# Patient Record
Sex: Female | Born: 1985 | Race: White | Hispanic: No | Marital: Married | Smoking: Never smoker
Health system: Southern US, Community
[De-identification: ages and names within clinical notes are randomized; demographics above are authoritative.]

## PROBLEM LIST (undated history)

## (undated) DIAGNOSIS — I10 Essential (primary) hypertension: Secondary | ICD-10-CM

## (undated) DIAGNOSIS — J45909 Unspecified asthma, uncomplicated: Secondary | ICD-10-CM

## (undated) DIAGNOSIS — G8929 Other chronic pain: Secondary | ICD-10-CM

## (undated) DIAGNOSIS — G43909 Migraine, unspecified, not intractable, without status migrainosus: Secondary | ICD-10-CM

## (undated) DIAGNOSIS — E119 Type 2 diabetes mellitus without complications: Secondary | ICD-10-CM

## (undated) DIAGNOSIS — K430 Incisional hernia with obstruction, without gangrene: Secondary | ICD-10-CM

## (undated) DIAGNOSIS — G4730 Sleep apnea, unspecified: Secondary | ICD-10-CM

## (undated) DIAGNOSIS — M25559 Pain in unspecified hip: Secondary | ICD-10-CM

## (undated) HISTORY — DX: Other chronic pain: G89.29

## (undated) HISTORY — DX: Migraine, unspecified, not intractable, without status migrainosus: G43.909

## (undated) HISTORY — DX: Type 2 diabetes mellitus without complications: E11.9

## (undated) HISTORY — DX: Pain in unspecified hip: M25.559

## (undated) HISTORY — DX: Unspecified asthma, uncomplicated: J45.909

## (undated) HISTORY — DX: Morbid (severe) obesity due to excess calories: E66.01

## (undated) HISTORY — PX: COLON SURGERY: SHX602

## (undated) HISTORY — PX: STOMACH SURGERY: SHX791

## (undated) HISTORY — PX: OTHER SURGICAL HISTORY: SHX169

## (undated) HISTORY — DX: Sleep apnea, unspecified: G47.30

## (undated) HISTORY — DX: Essential (primary) hypertension: I10

## (undated) HISTORY — DX: Incisional hernia with obstruction, without gangrene: K43.0

## (undated) HISTORY — PX: CHOLECYSTECTOMY: SHX55

---

## 2008-12-07 ENCOUNTER — Ambulatory Visit: Payer: Self-pay | Admitting: Unknown Physician Specialty

## 2008-12-07 IMAGING — US ABDOMEN ULTRASOUND
1 series · 14 of 25 positions shown · non-contrast
Comparison: none

REASON FOR EXAM: epigastric pain   nausea vomiting
COMMENTS:

[Series 1: abdomen ultrasound · 0.50mm/px · 14 of 45 slices shown]
[im 1/45]
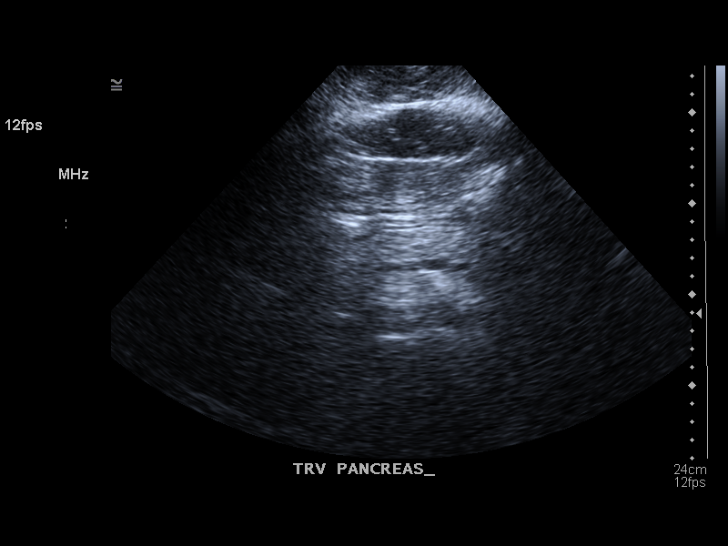
[im 4/45]
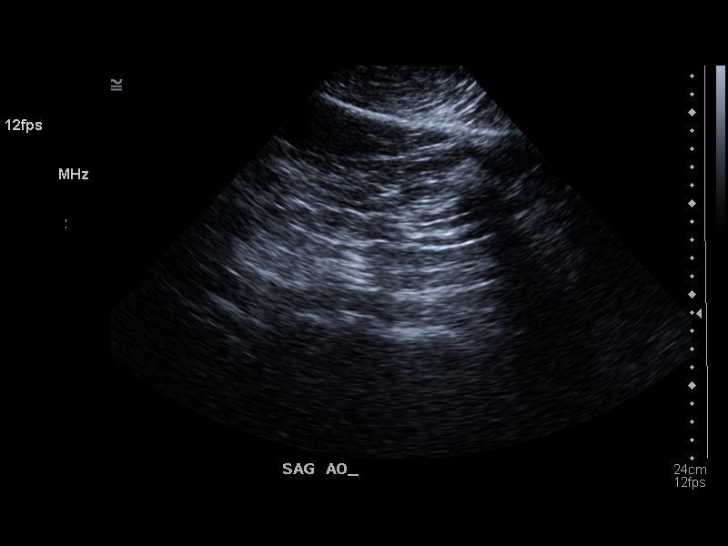
[im 8/45]
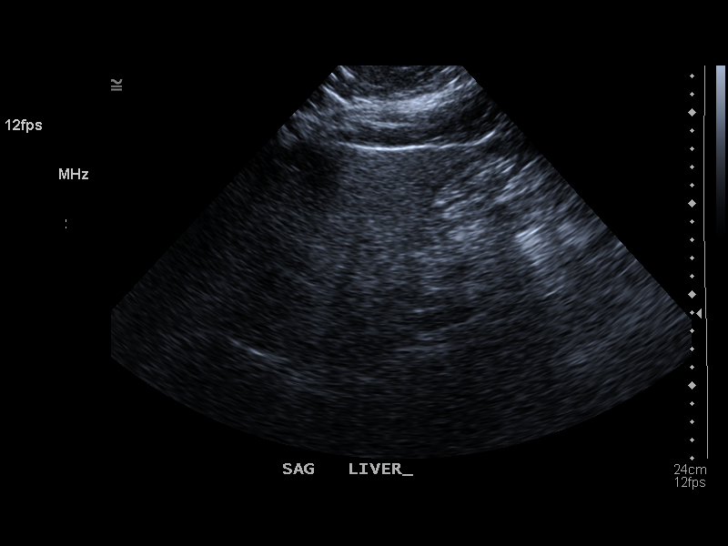
[im 12/45]
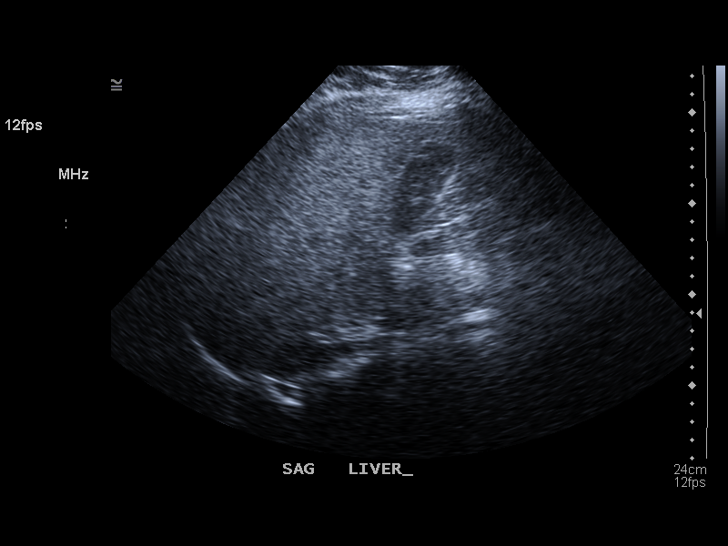
[im 15/45]
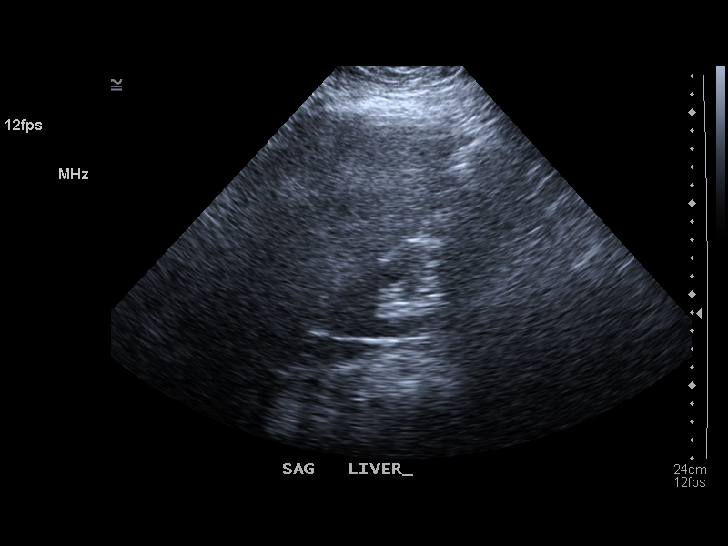
[im 17/45]
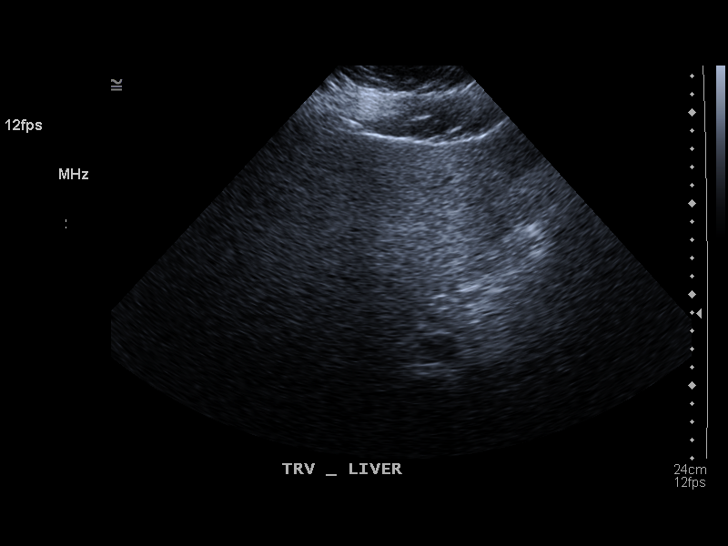
[im 21/45]
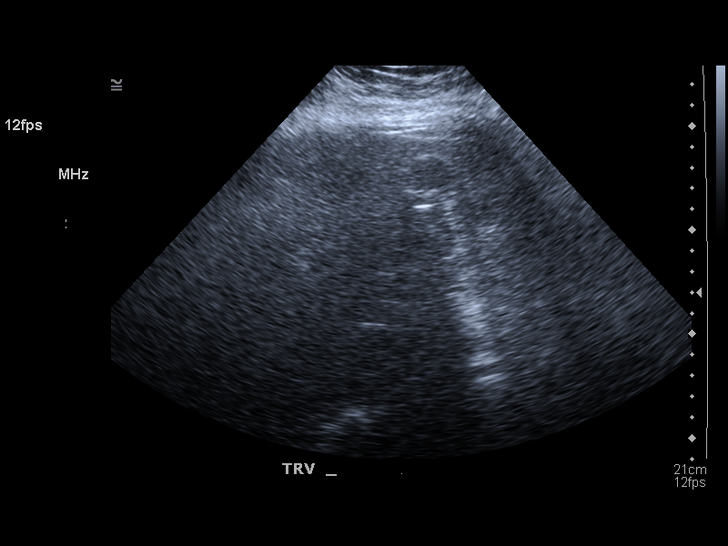
[im 24/45]
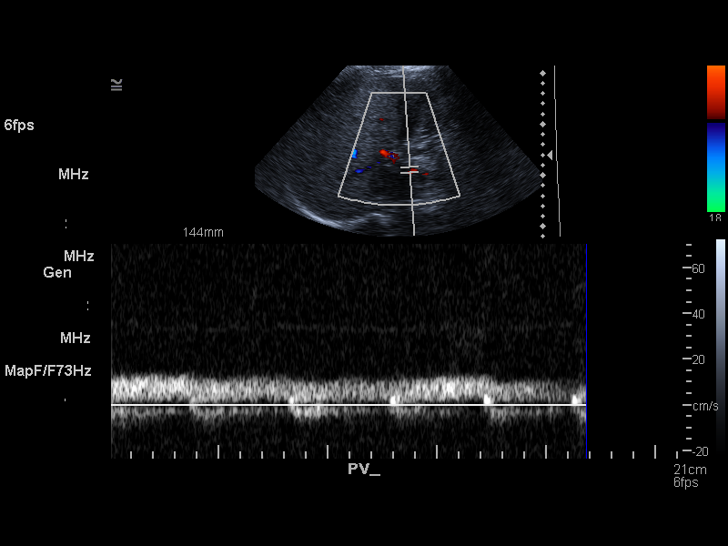
[im 28/45]
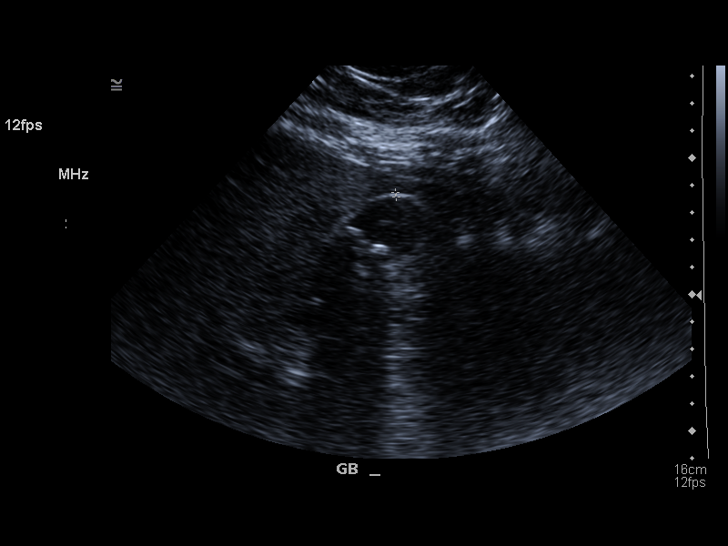
[im 30/45]
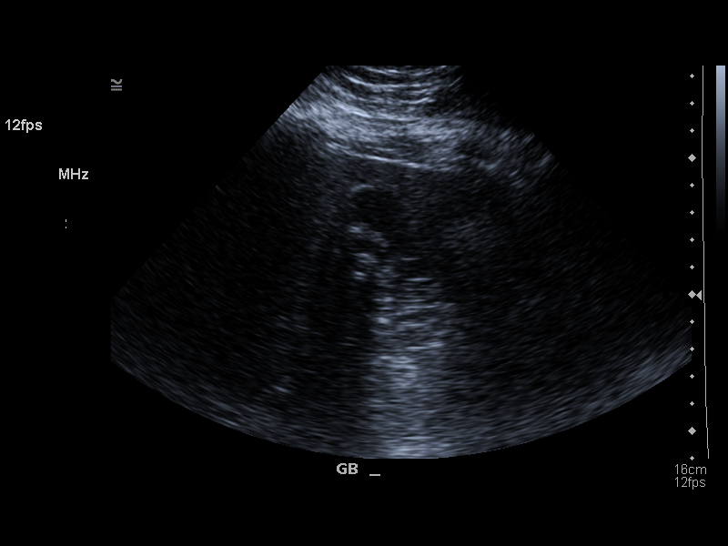
[im 34/45]
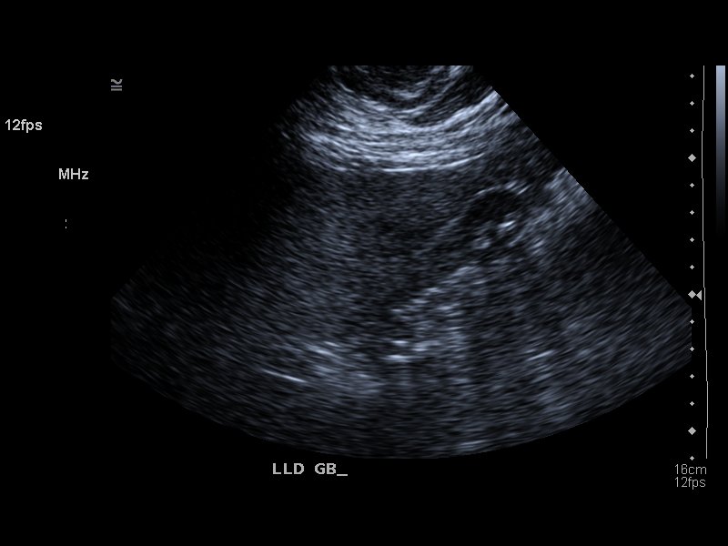
[im 37/45]
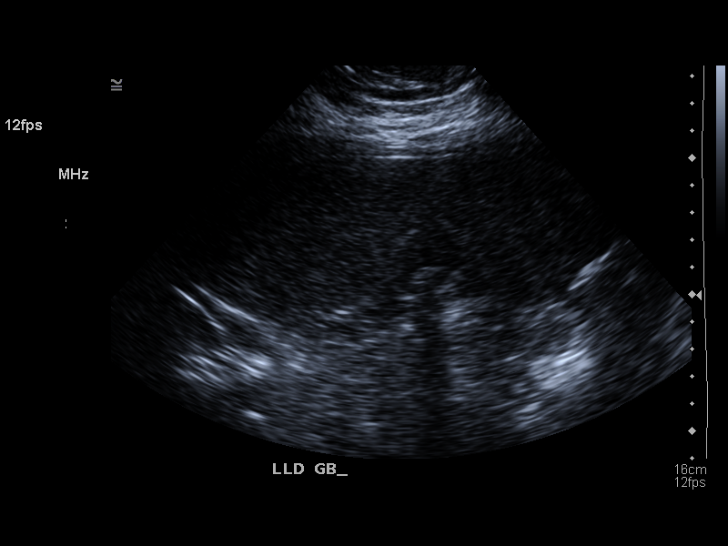
[im 41/45]
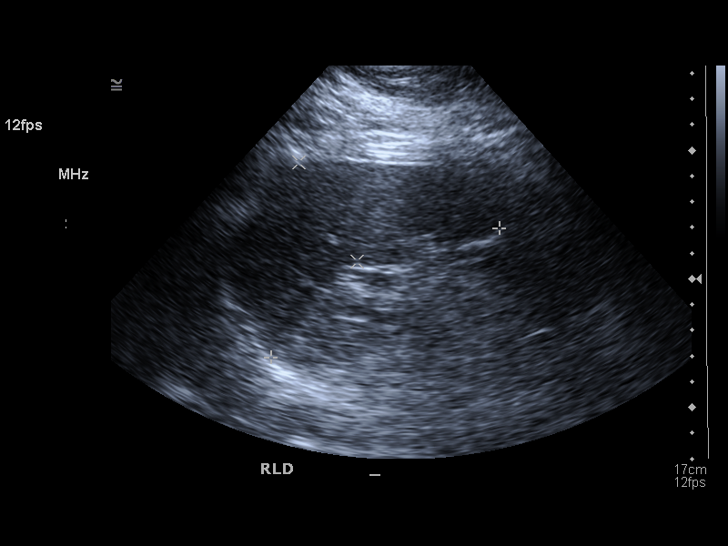
[im 45/45]
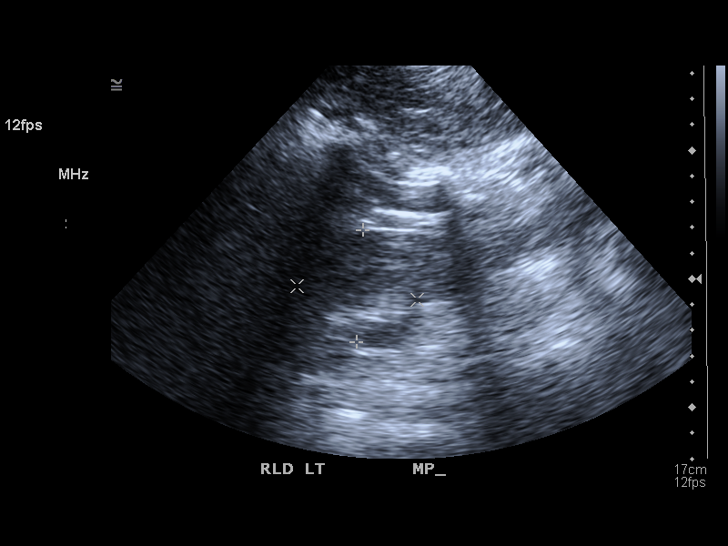

[14 of 25 positions shown; findings below may reference images not displayed]

PROCEDURE:     US  - US ABDOMEN GENERAL SURVEY  - [DATE]  [DATE]

RESULT:     Ultrasound of the abdomen is performed in the standard fashion.

The pancreas cannot be well demonstrated. The study is limited because of
the patient's body habitus. The liver echotexture is grossly normal with no
mass. Portal venous flow is normal. There is slightly increased echogenicity
of the liver suggestive of fatty infiltration. Cholelithiasis is present.
The gallbladder wall thickness is 1.7 mm. The common bile duct diameter is
4.8 mm. The stones appear to be mobile. There is no focal sonographic
Murphy's sign. The spleen and kidneys appear unremarkable. The aorta is not
visualized.
IMPRESSION: Cholelithiasis with multiple mobile shadowing stones. No
definite ultrasound evidence of acute cholecystitis is demonstrated. There
does appear to be fatty infiltration of the liver. The aorta and pancreas
are not well demonstrated.

## 2008-12-13 ENCOUNTER — Ambulatory Visit: Payer: Self-pay | Admitting: Surgery

## 2008-12-13 IMAGING — CR DG CHOLANGIOGRAM OPERATIVE
1 series · 1 of 1 positions shown · non-contrast
Comparison: No comparison

REASON FOR EXAM: cholelithisis
COMMENTS:

PROCEDURE:     DXR - DXR CHOLANGIOGRAM OP (INITIAL)  - [DATE] [DATE]
RESULT:     History: Cholelithiasis

[imported/digitized images]
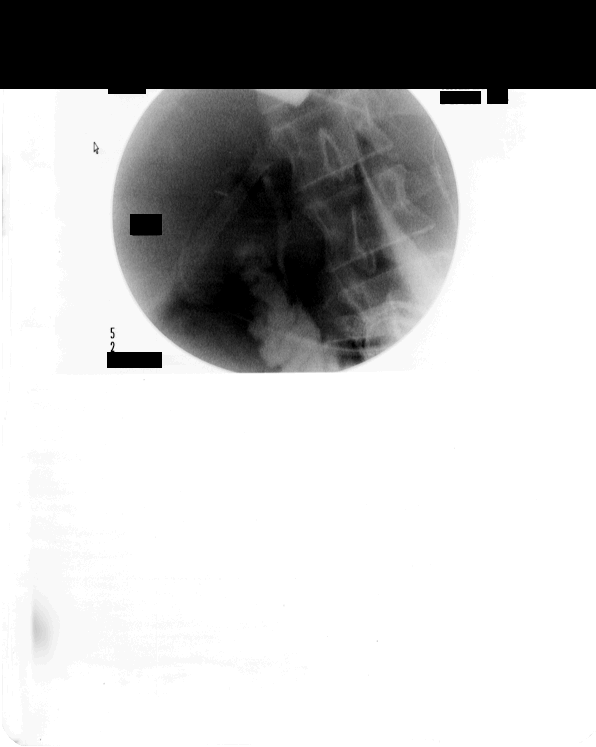

[1 of 1 positions shown; findings below may reference images not displayed]

FINDINGS: Three fluoroscopic intraoperative spot images from intraoperative
cholangiogram demonstrates opacification of the common bile duct without
evidence of a focal filling defect. There is reflux of contrast into the
proximal pancreatic duct. Contrast is seen in the common hepatic duct.
IMPRESSION: Please see above.

## 2009-11-26 ENCOUNTER — Emergency Department: Payer: Self-pay | Admitting: Internal Medicine

## 2010-05-02 ENCOUNTER — Ambulatory Visit: Payer: Self-pay | Admitting: Unknown Physician Specialty

## 2010-05-07 ENCOUNTER — Ambulatory Visit: Payer: Self-pay | Admitting: General Practice

## 2010-05-07 IMAGING — CR DG CHEST 2V
1 series · 3 of 3 positions shown · non-contrast
Comparison: none

REASON FOR EXAM: shortness of breath cough  x 3 weeks fax report to [REDACTED] [PHONE_NUMBER]
COMMENTS:

PROCEDURE:     DXR - DXR CHEST PA (OR AP) AND LATERAL  - [DATE] [DATE]
RESULT:     The lung fields are clear. The heart, mediastinal and osseous
structures show no significant abnormalities. No pneumothorax or pleural
effusion is seen.

[Series 1: view not recorded · 0.17mm/px · 3 of 3 slices shown]
[im 1/3]
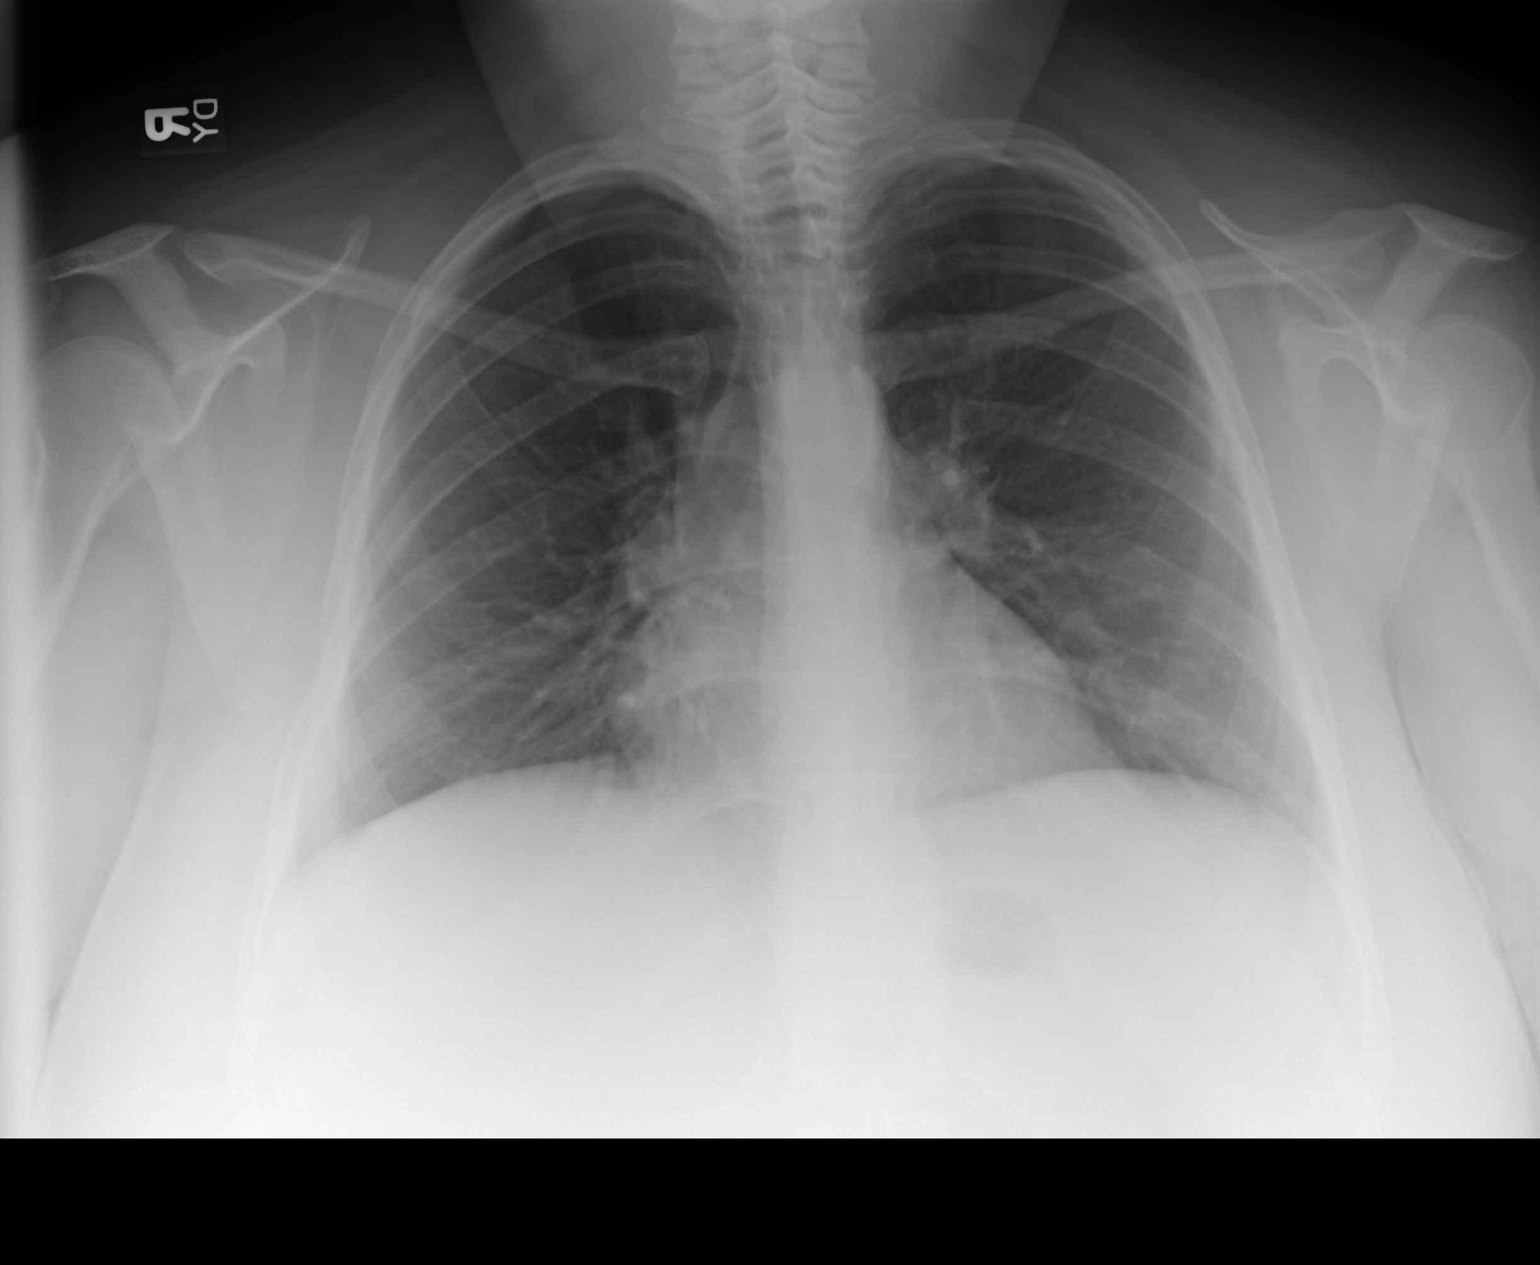
[im 2/3]
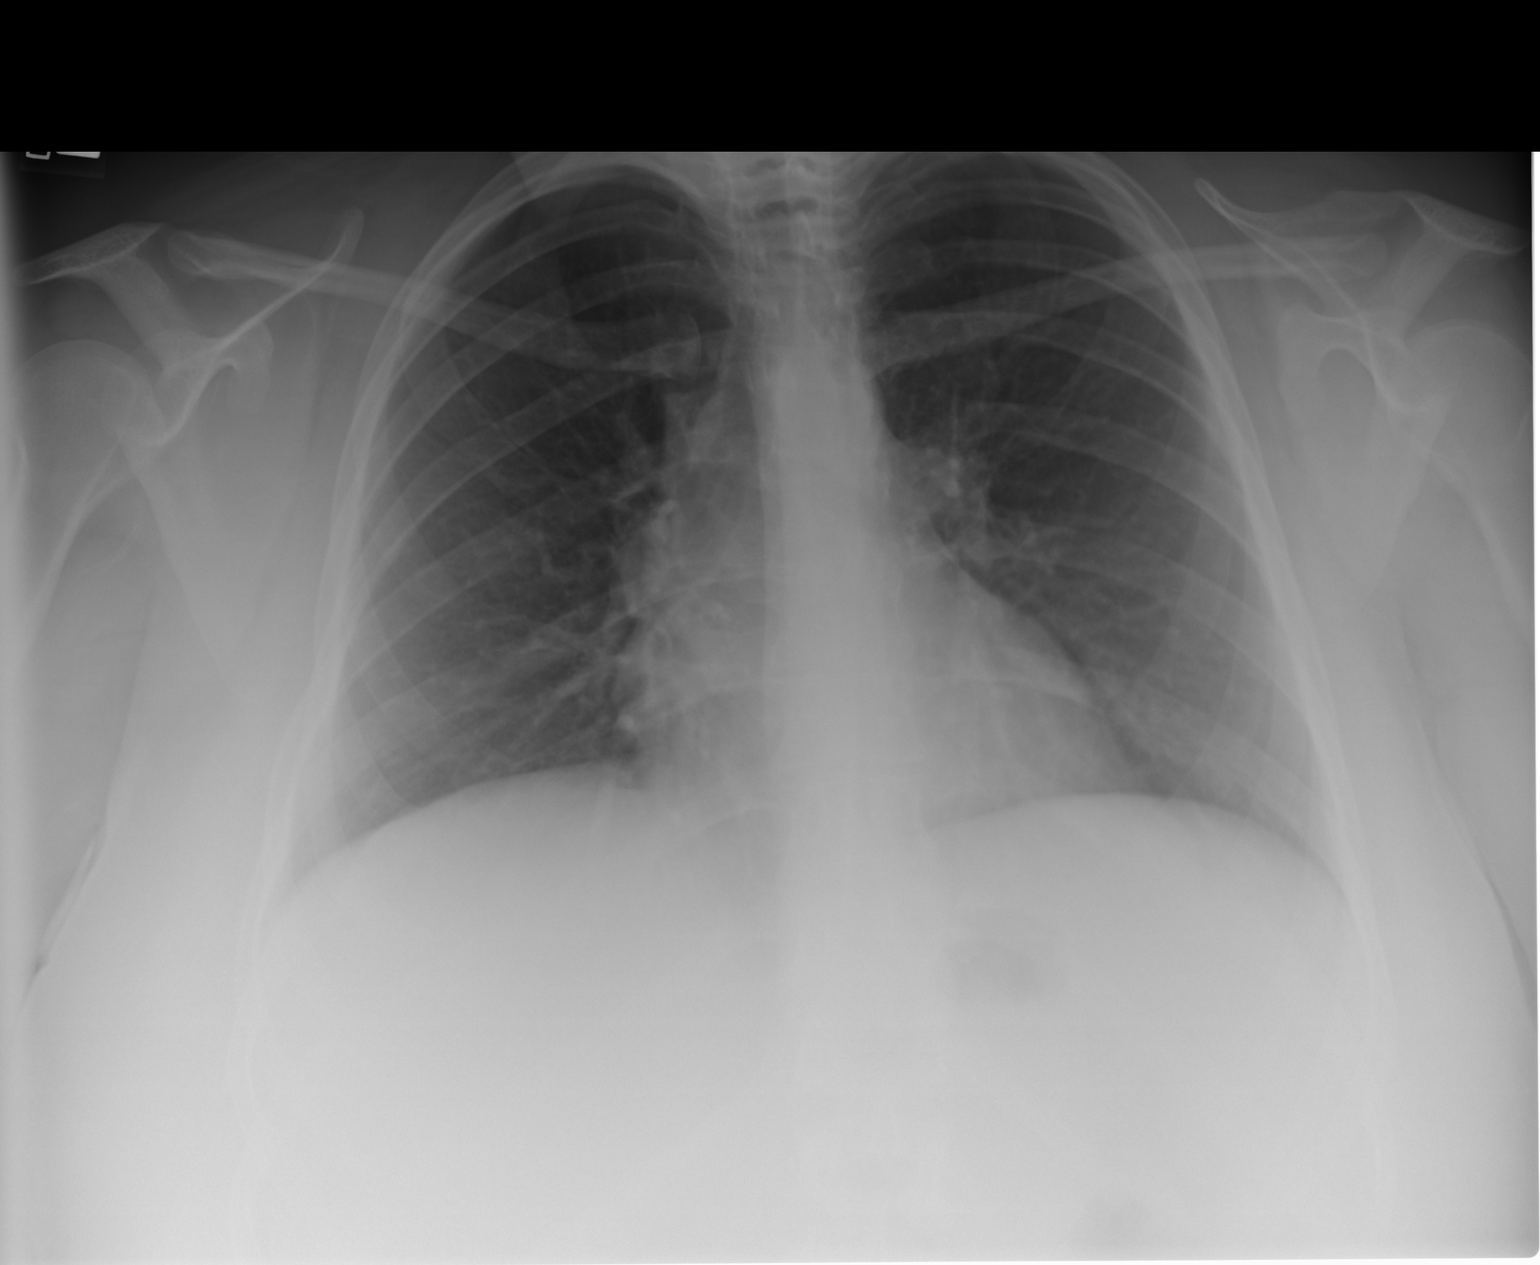
[im 3/3]
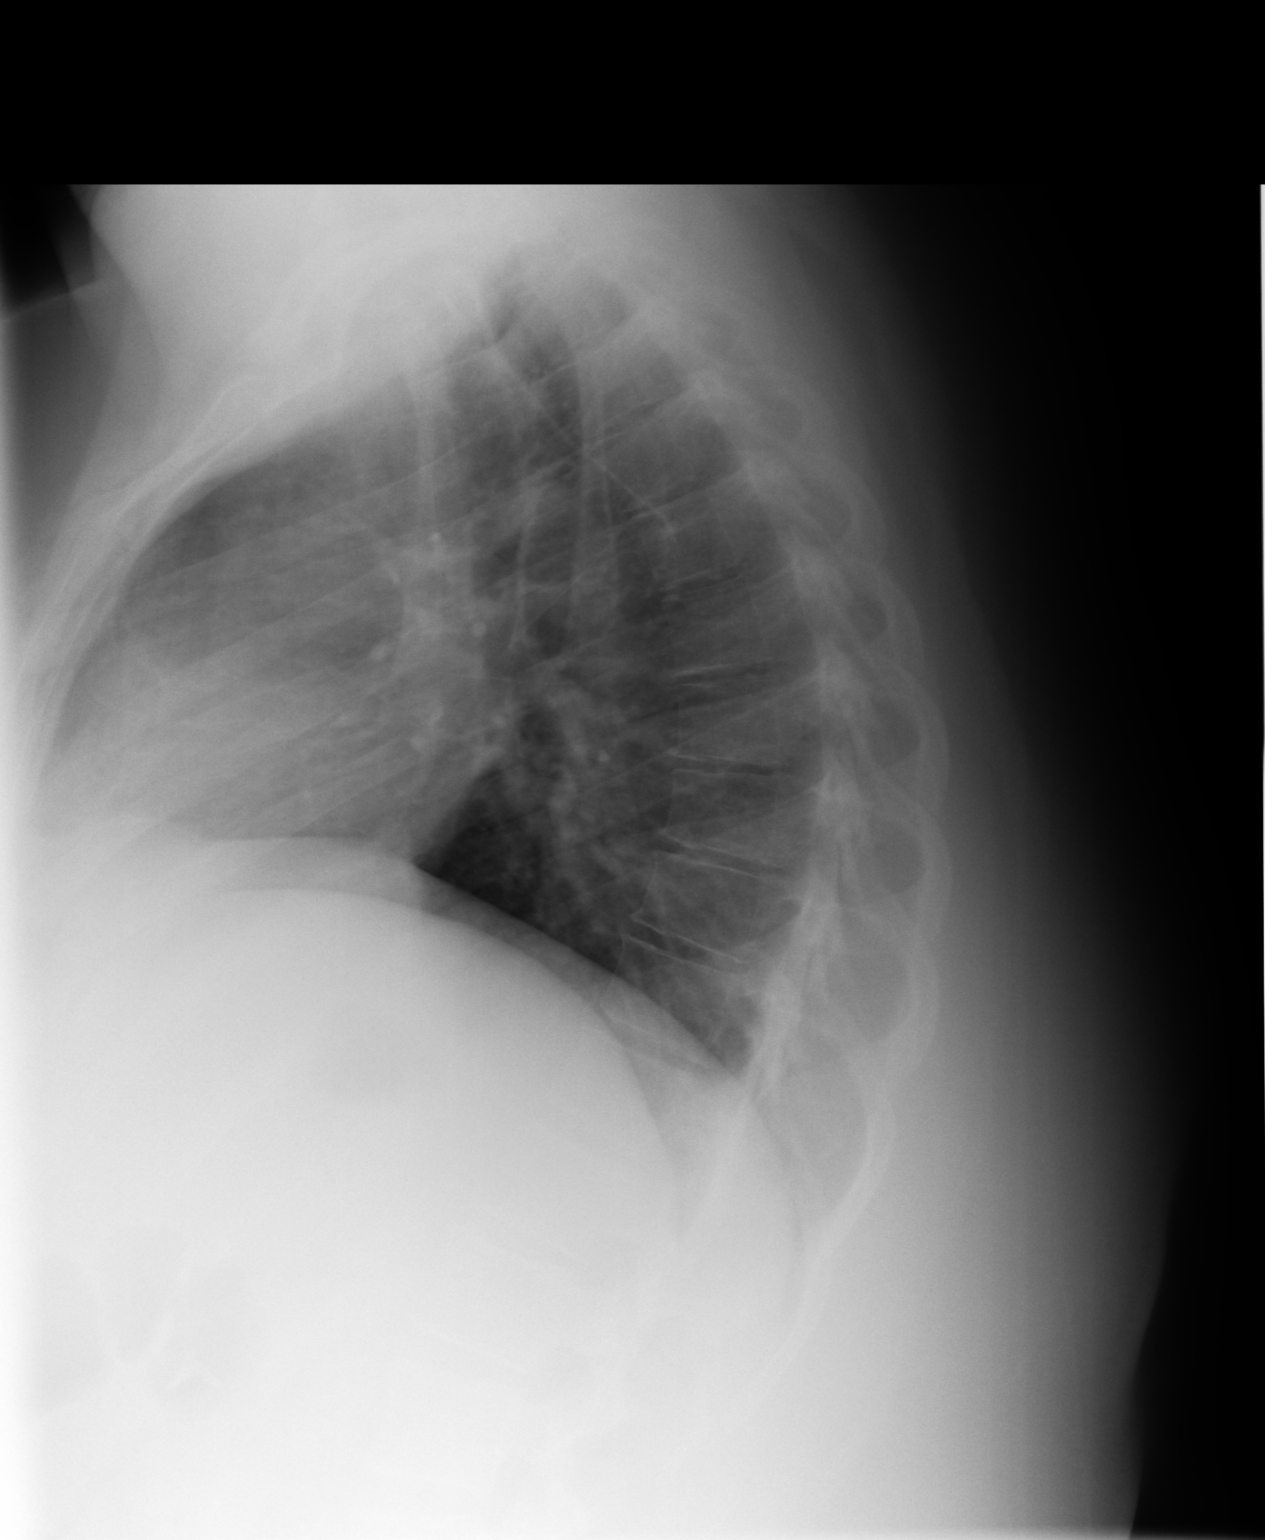

[3 of 3 positions shown; findings below may reference images not displayed]

IMPRESSION: 1. No significant abnormalities are noted.

## 2010-05-08 ENCOUNTER — Emergency Department: Payer: Self-pay | Admitting: Emergency Medicine

## 2010-05-08 ENCOUNTER — Ambulatory Visit: Payer: Self-pay | Admitting: Family Medicine

## 2010-05-24 ENCOUNTER — Ambulatory Visit: Payer: Self-pay | Admitting: Unknown Physician Specialty

## 2010-09-17 ENCOUNTER — Encounter: Payer: Self-pay | Admitting: Maternal and Fetal Medicine

## 2010-09-17 IMAGING — US US OB DETAIL+14 WK - NRPT [facility]
1 series · 14 of 28 positions shown · non-contrast
Comparison: none

[Series 1: us ob detail+14 wk - nrpt [facility] · 14 of 70 slices shown]
[im 3/70]
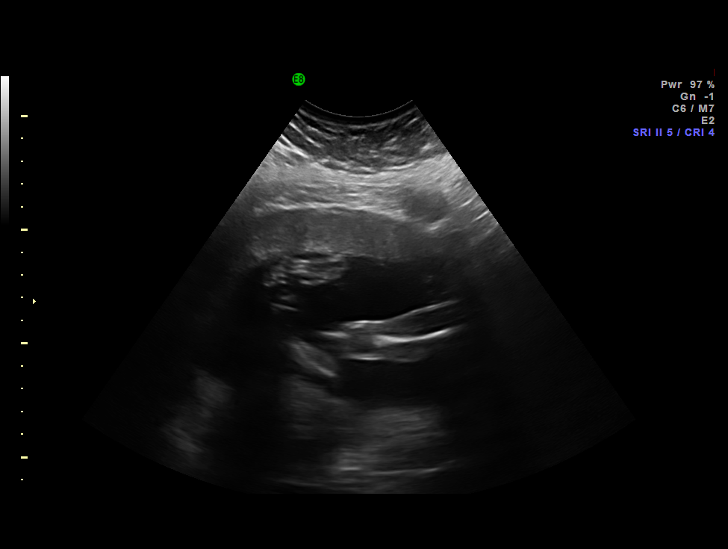
[im 8/70]
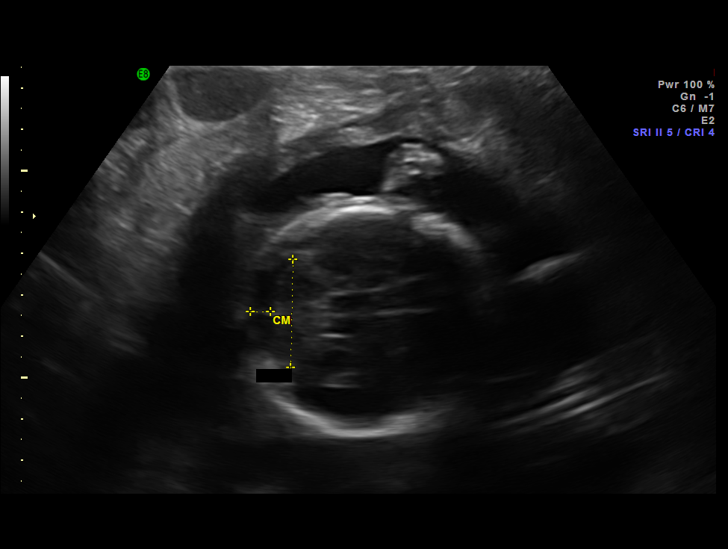
[im 13/70]
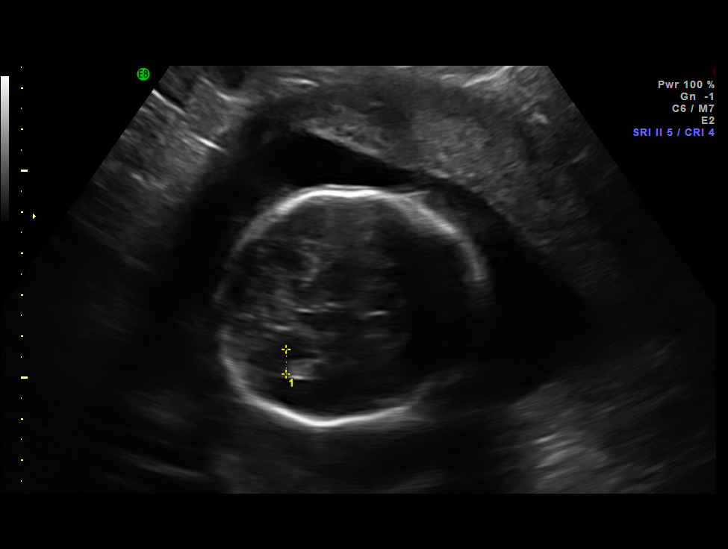
[im 18/70]
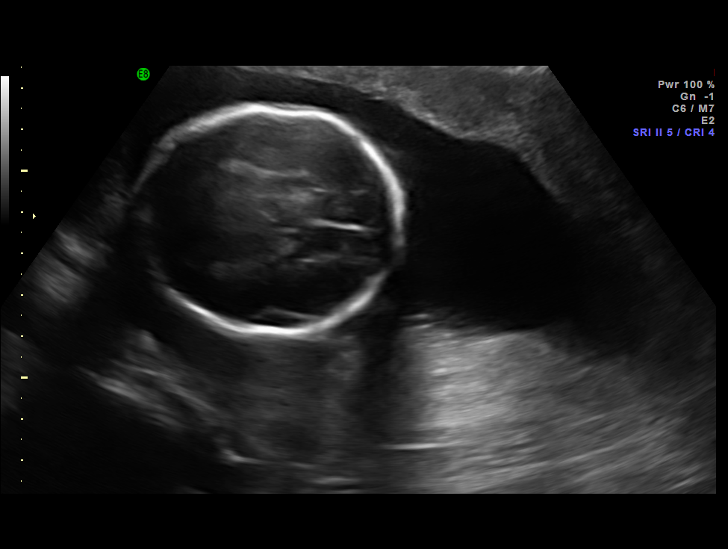
[im 24/70]
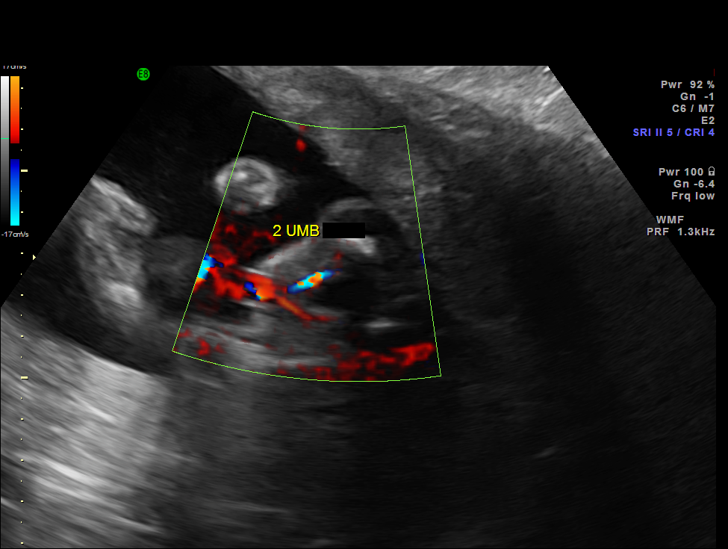
[im 29/70]
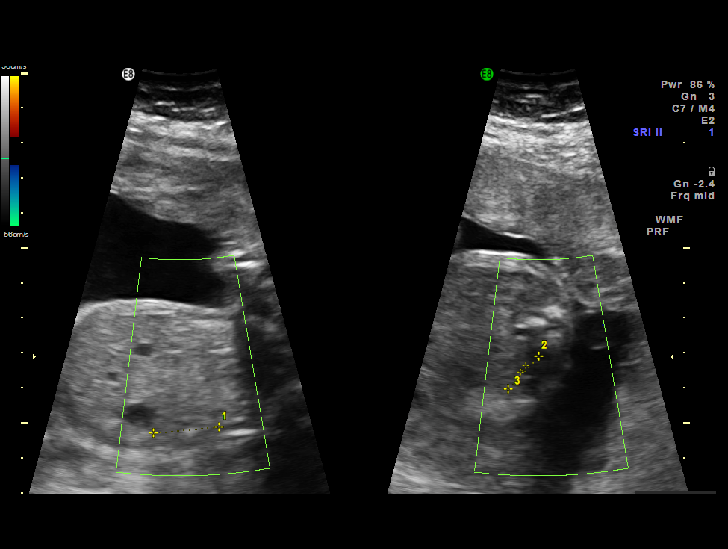
[im 34/70]
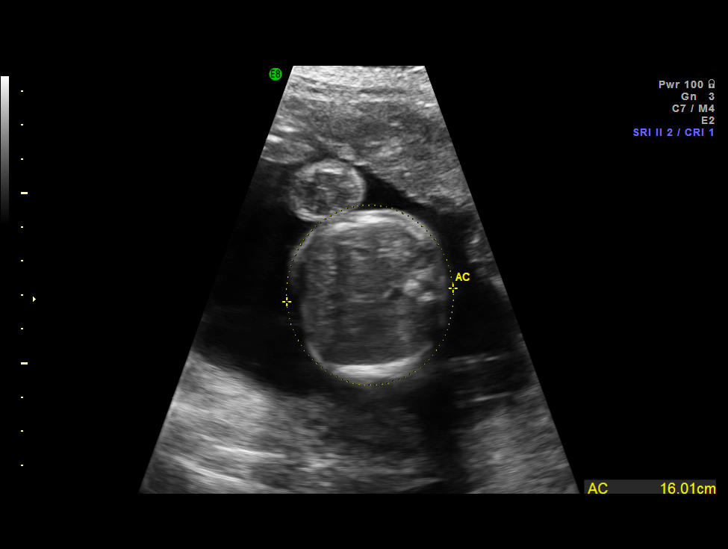
[im 39/70]
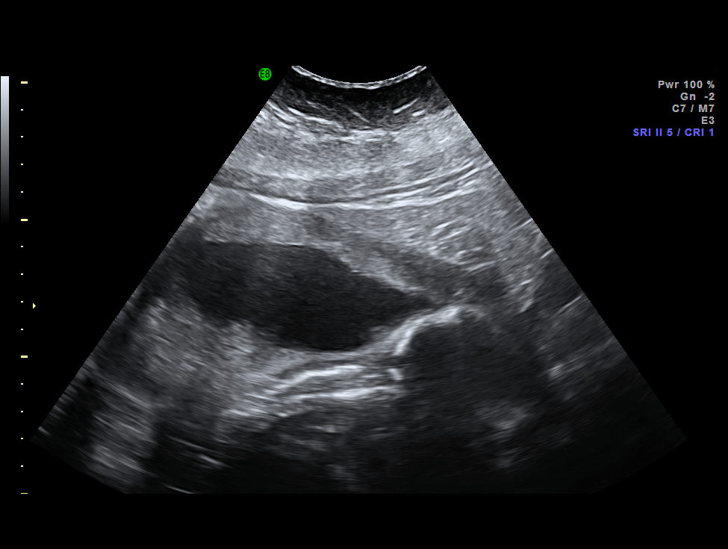
[im 44/70]
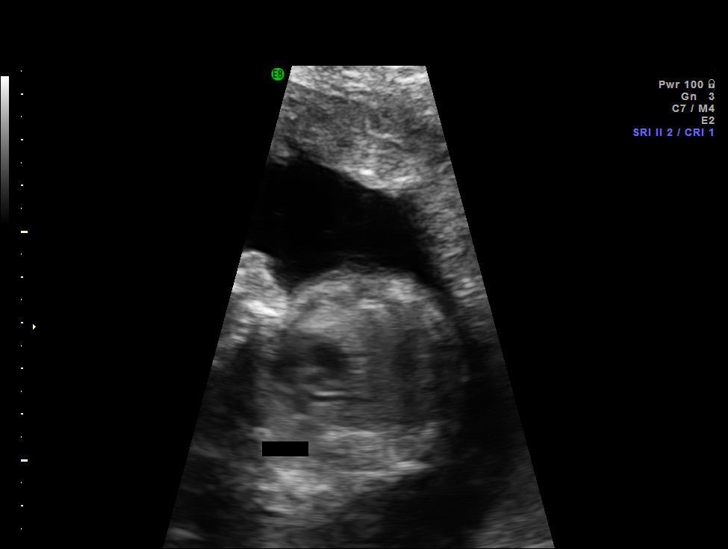
[im 49/70]
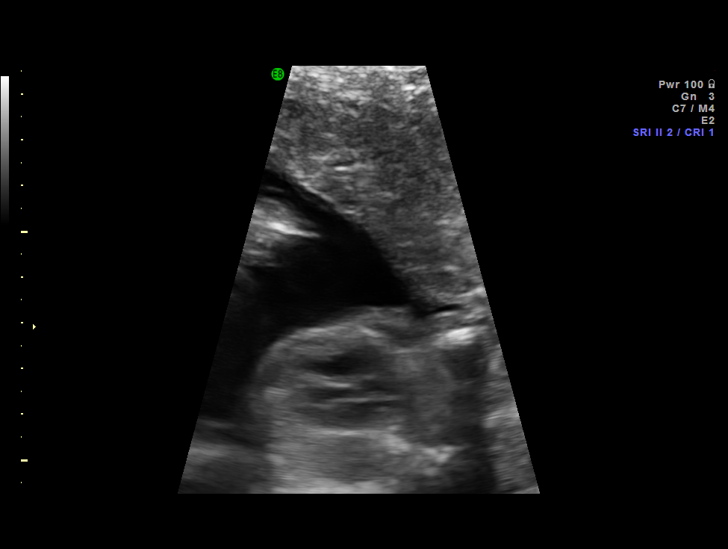
[im 54/70]
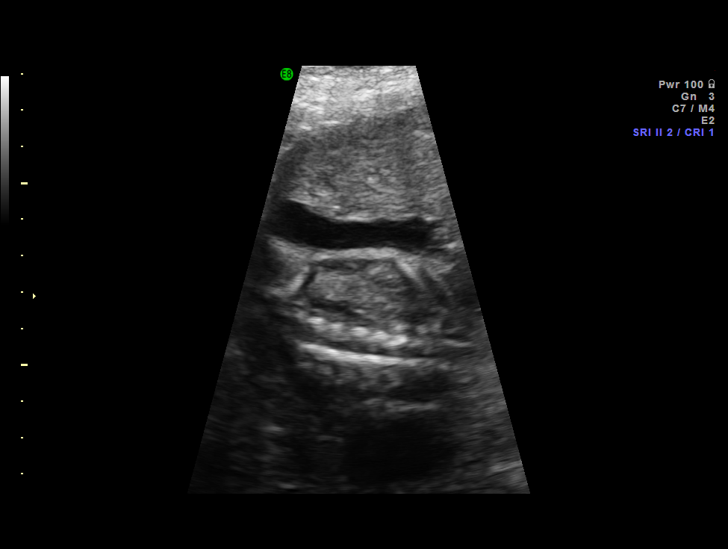
[im 59/70]
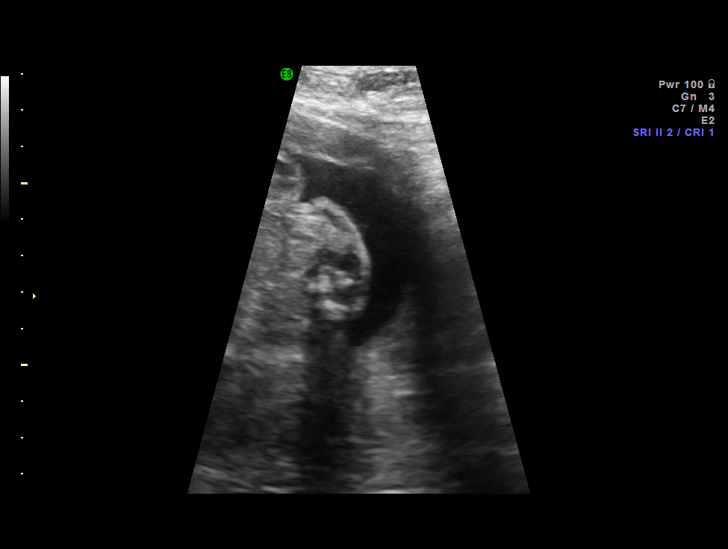
[im 64/70]
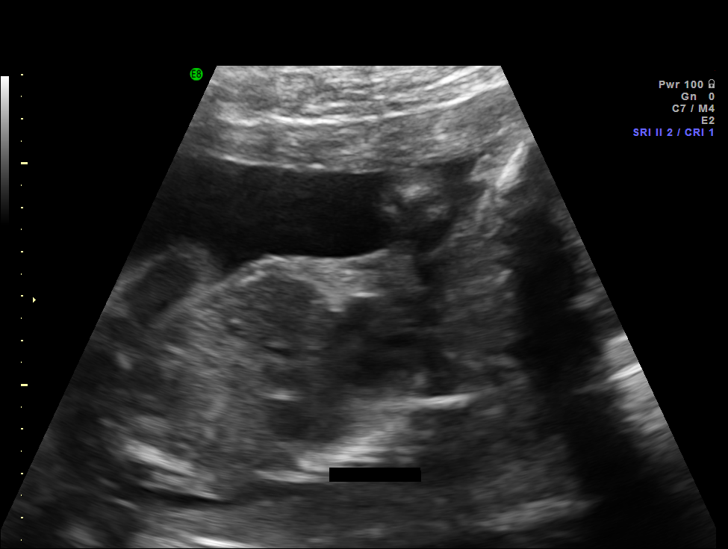
[im 70/70]
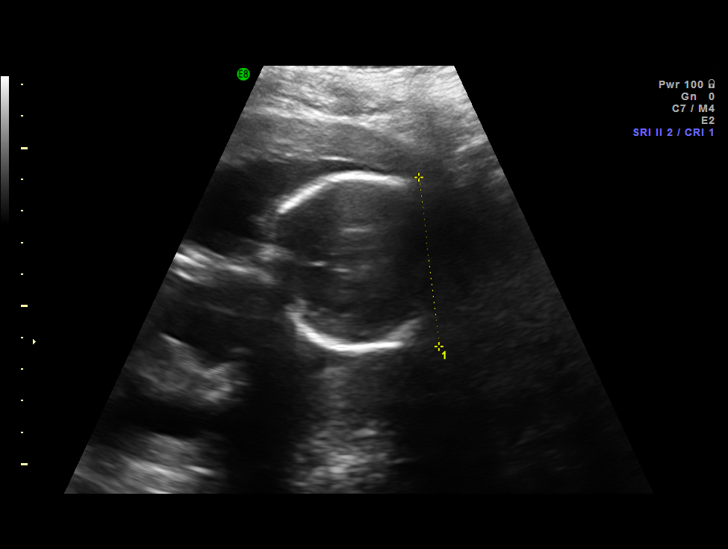

[14 of 28 positions shown; findings below may reference images not displayed]

IMAGES IMPORTED FROM THE [employer] WORKFLOW SYSTEM
NO DICTATION FOR STUDY

## 2010-09-25 ENCOUNTER — Observation Stay: Payer: Self-pay | Admitting: Obstetrics & Gynecology

## 2010-09-26 ENCOUNTER — Observation Stay: Payer: Self-pay | Admitting: Obstetrics and Gynecology

## 2010-10-10 ENCOUNTER — Inpatient Hospital Stay: Payer: Self-pay | Admitting: Obstetrics and Gynecology

## 2010-12-10 ENCOUNTER — Ambulatory Visit: Payer: Self-pay | Admitting: Internal Medicine

## 2011-09-03 ENCOUNTER — Ambulatory Visit: Payer: Self-pay

## 2011-09-03 IMAGING — CT CT ABD-PELV W/ CM
1 of 2 series · 15 of 32 positions shown, 19 images · non-contrast
Comparison: none

REASON FOR EXAM: RLQ pain fever nausea eval appendicitis      CR 260 [KX]
COMMENTS:

[Series 2: soft tissue · axial · 0.97mm/px · z∈[-530,-104]mm · 15 of 95 slices shown, 19 images]
[im 5/95  soft-tissue]
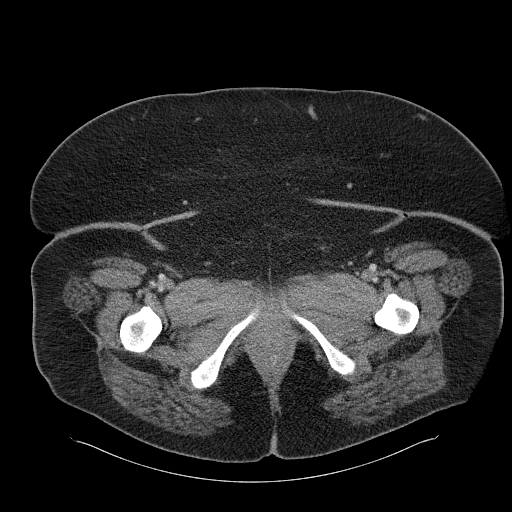
[im 5/95  bone]
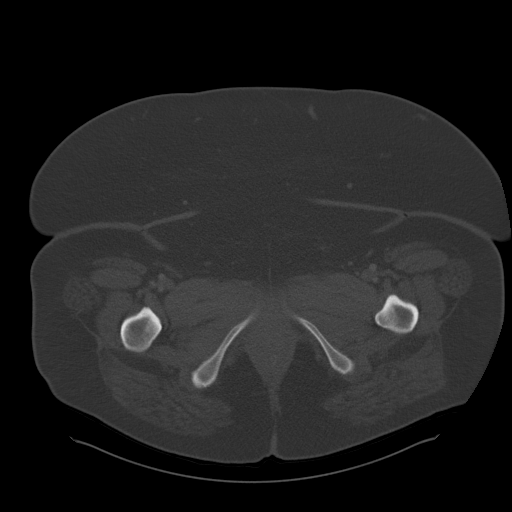
[im 13/95  soft-tissue]
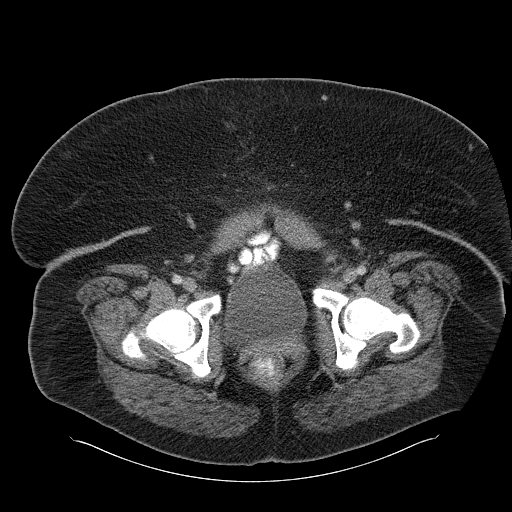
[im 22/95  soft-tissue]
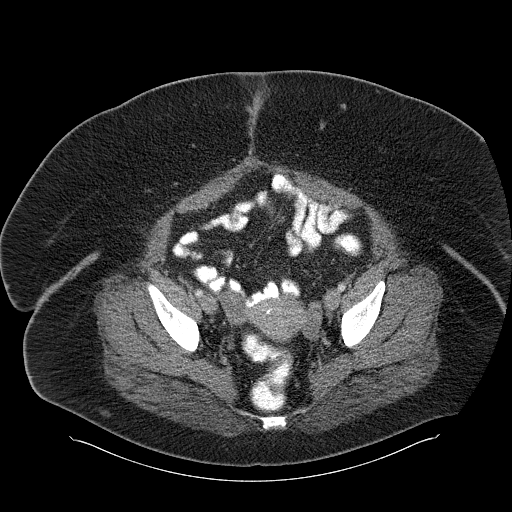
[im 26/95  soft-tissue]
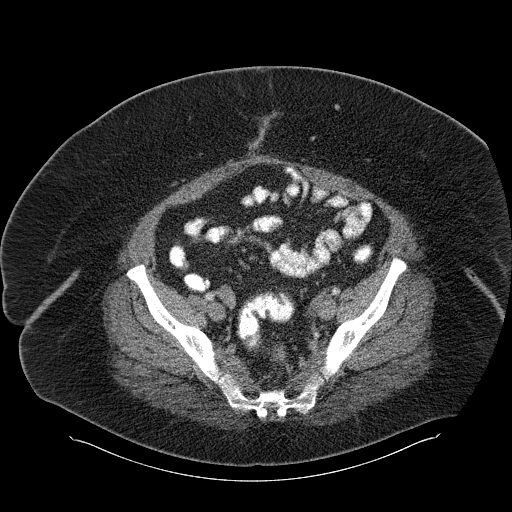
[im 35/95  soft-tissue]
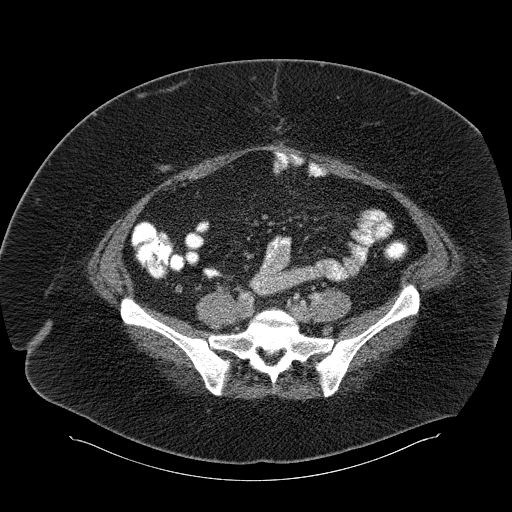
[im 39/95  soft-tissue]
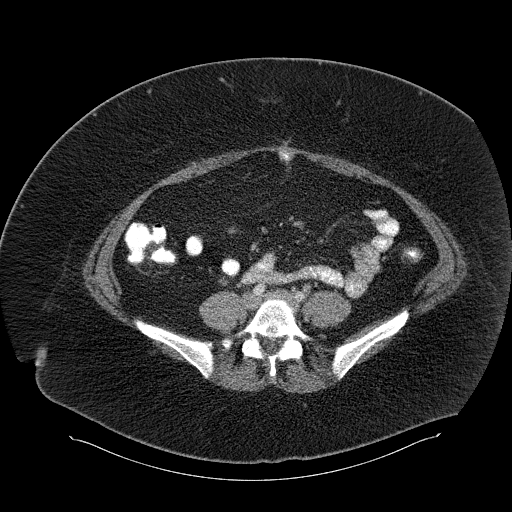
[im 48/95  soft-tissue]
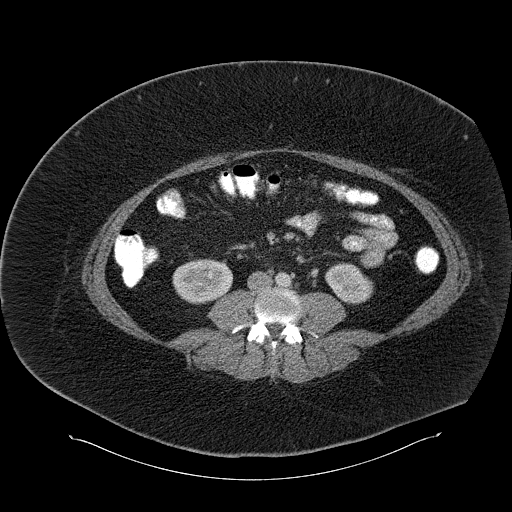
[im 56/95  soft-tissue]
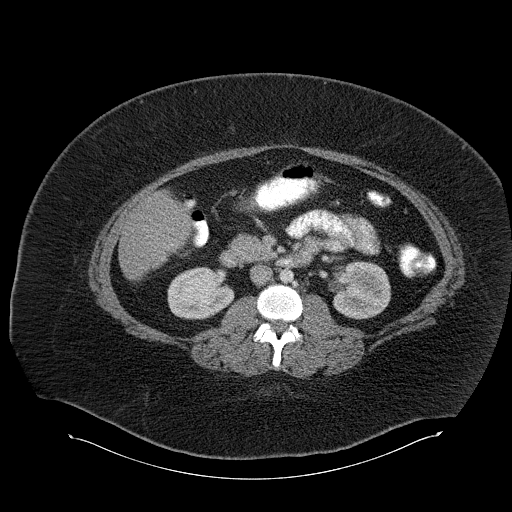
[im 60/95  soft-tissue]
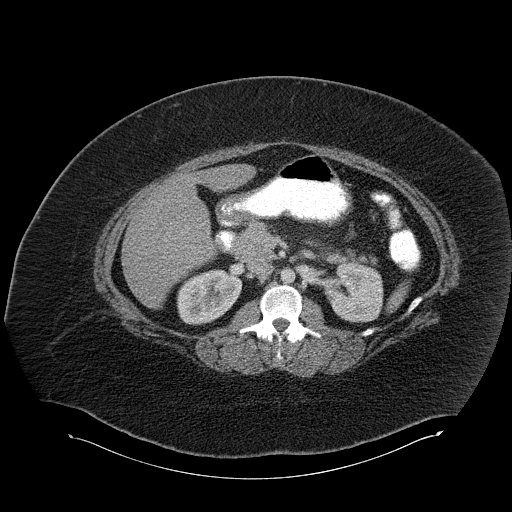
[im 60/95  bone]
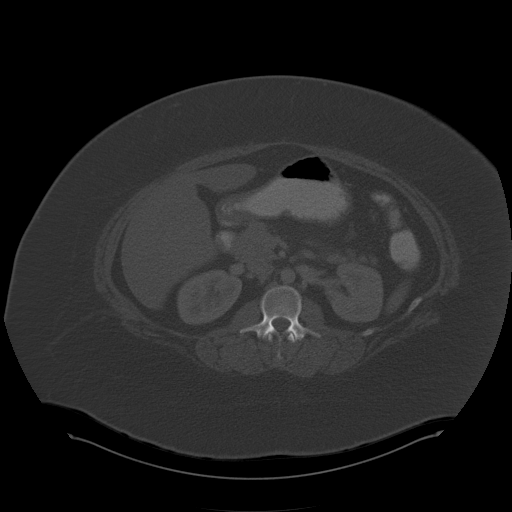
[im 69/95  soft-tissue]
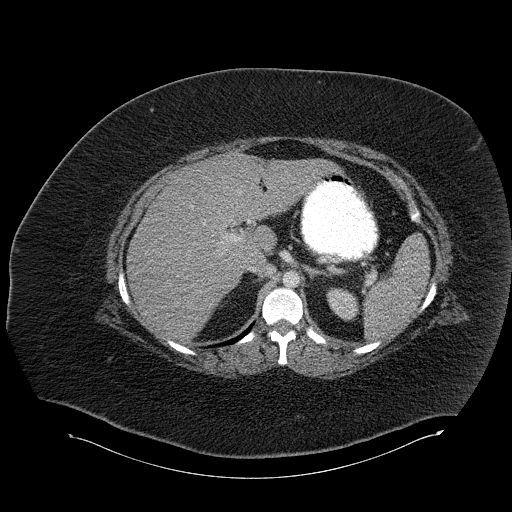
[im 73/95  soft-tissue]
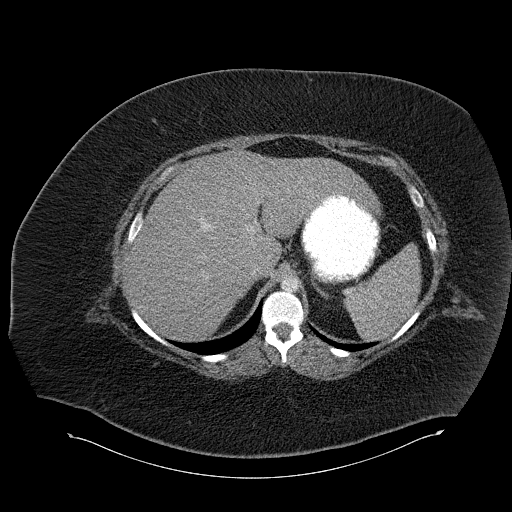
[im 77/95  lung]
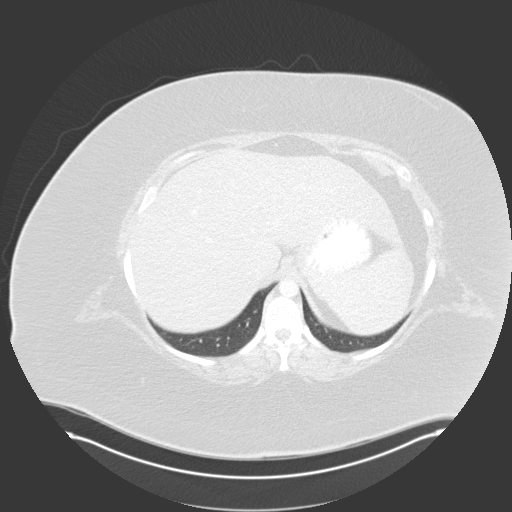
[im 82/95  soft-tissue]
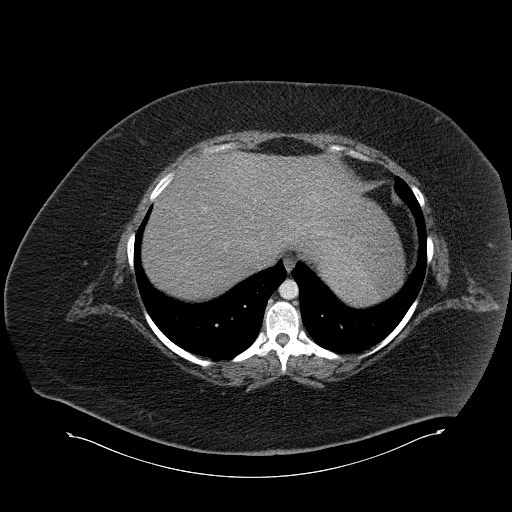
[im 82/95  lung]
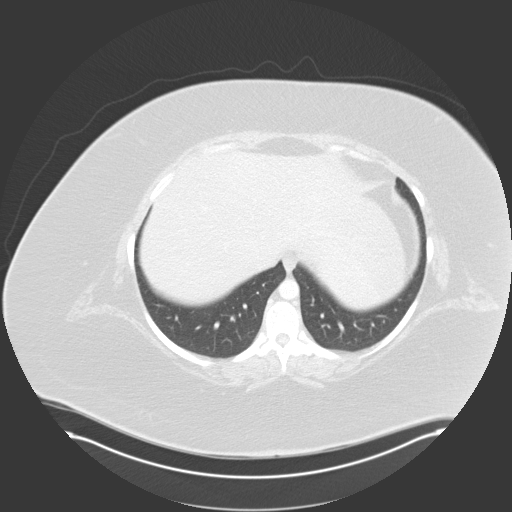
[im 86/95  lung]
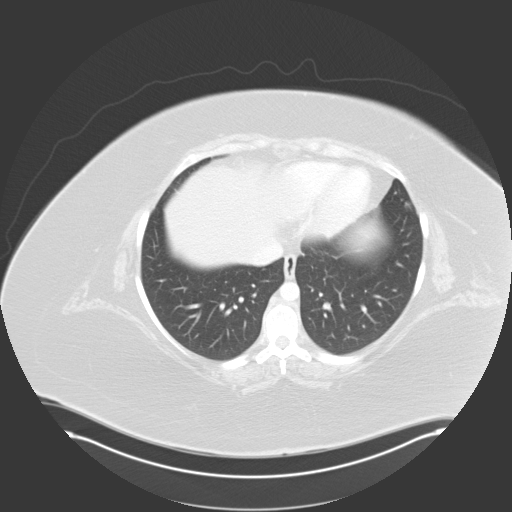
[im 90/95  soft-tissue]
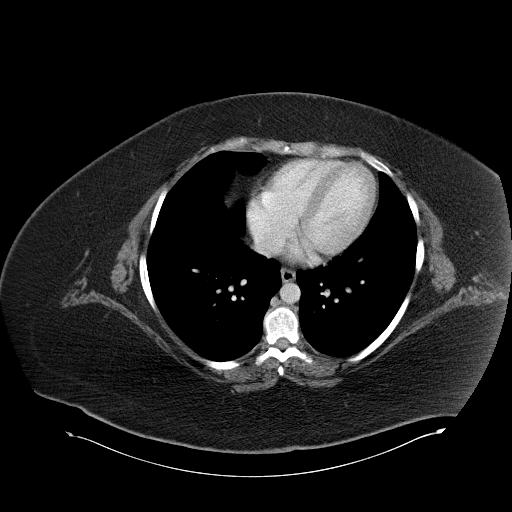
[im 90/95  lung]
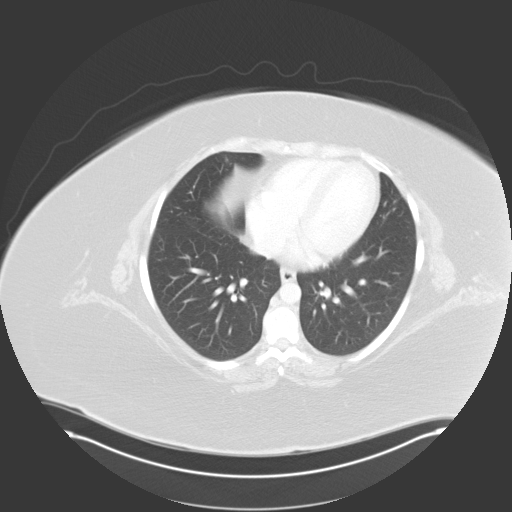

[15 of 32 positions shown; findings below may reference images not displayed]

PROCEDURE:     CT  - CT ABDOMEN / PELVIS  W  - [DATE]  [DATE]

RESULT:     Axial CT scanning was performed through the abdomen and pelvis
at 5 mm intervals and slice thicknesses following intravenous administration
of 100 cc of [KX]. The patient also received oral contrast material.
Review of multiplanar reconstructed images was performed separately on the
VIA monitor.

The liver exhibits decreased density diffusely consistent with fatty
infiltrative change. There is no focal mass or ductal dilation. The
gallbladder is surgically absent. The pancreas, spleen, partially distended
stomach, adrenal glands, and kidneys are normal in appearance. The caliber
of the abdominal aorta is normal. The periaortic and pericaval regions are
normal in appearance. The orally administered contrast has traversed the
small bowel and reached the right question the small and large bowel and
reached the rectum. There is no evidence of acute appendicitis. There is no
evidence of diverticulitis or other forms of colitis. There is no evidence
of bowel obstruction. There is no inguinal nor umbilical hernia.

Within the pelvis an IUD is in place. The adnexal structures are normal by
CT standards. The partially distended urinary bladder is normal in
appearance. The lung bases are clear. The lumbar vertebral bodies are
preserved in height. Is very mild disc space narrowing at L4-L5. The bony
pelvis is grossly normal in appearance.
IMPRESSION: 1. I see no acute abnormality of the pelvic visceral structures.
2. I do not see evidence of acute appendicitis nor other acute bowel
abnormality.
3. There is no intra-abdominal nor pelvic lymphadenopathy nor ascites.
4. There is no acute hepatobiliary abnormality.

This report was called by me to Ms. D[facility]AB[facility], NP, at [facility] at
[DATE] p.m. on [DATE].

## 2012-03-23 ENCOUNTER — Ambulatory Visit: Payer: Self-pay

## 2012-05-01 ENCOUNTER — Ambulatory Visit: Payer: Self-pay | Admitting: General Practice

## 2012-05-01 IMAGING — US US EXTREM LOW VENOUS*R*
1 series · 14 of 24 positions shown · non-contrast
Comparison: none

REASON FOR EXAM: CR[PHONE_NUMBER]Eval for DVT  pain in leg  SOB
COMMENTS:

[Series 1: us extrem low venous*right* · 0.10mm/px · 14 of 26 slices shown]
[im 1/26]
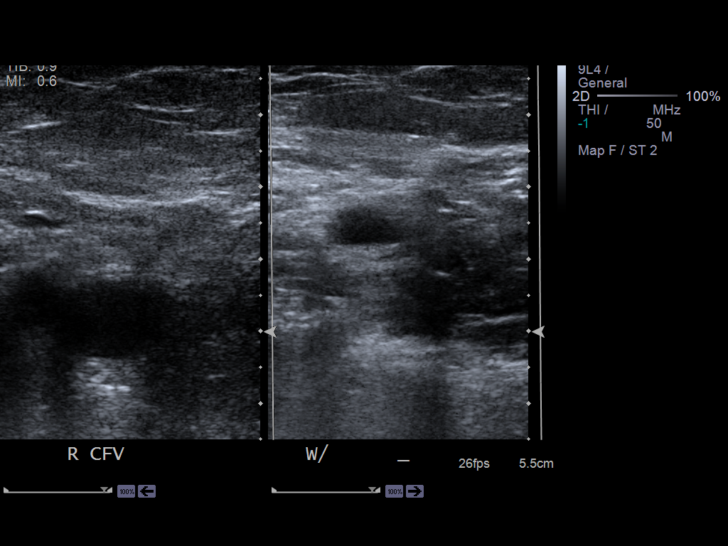
[im 3/26]
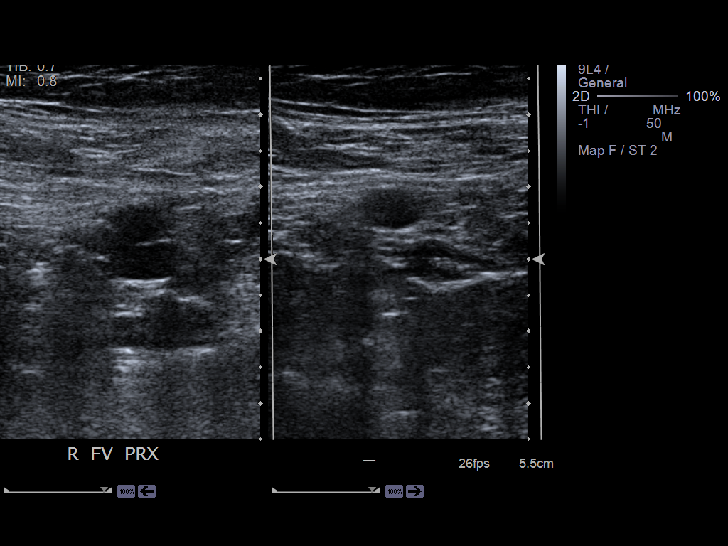
[im 5/26]
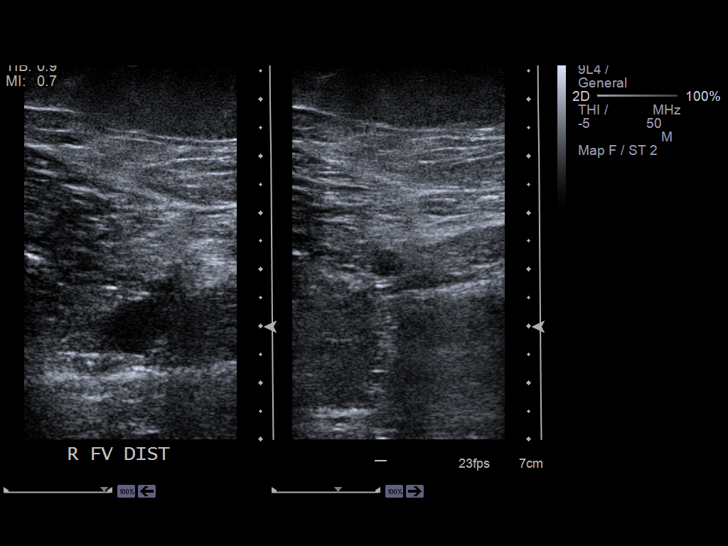
[im 7/26]
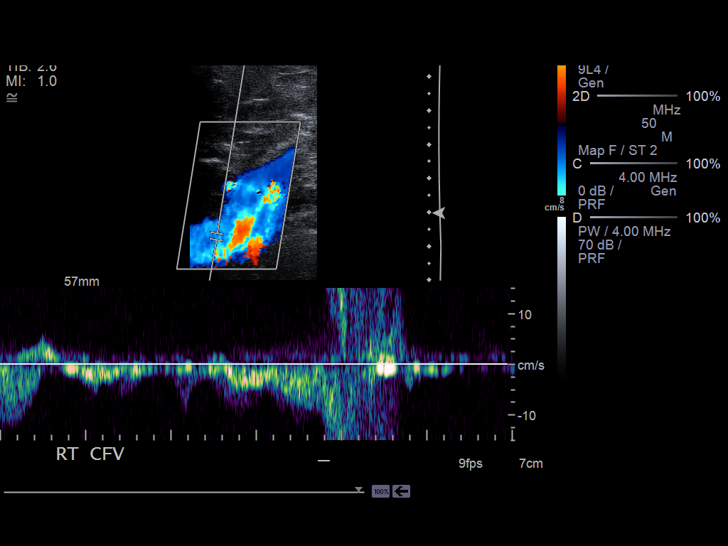
[im 8/26]
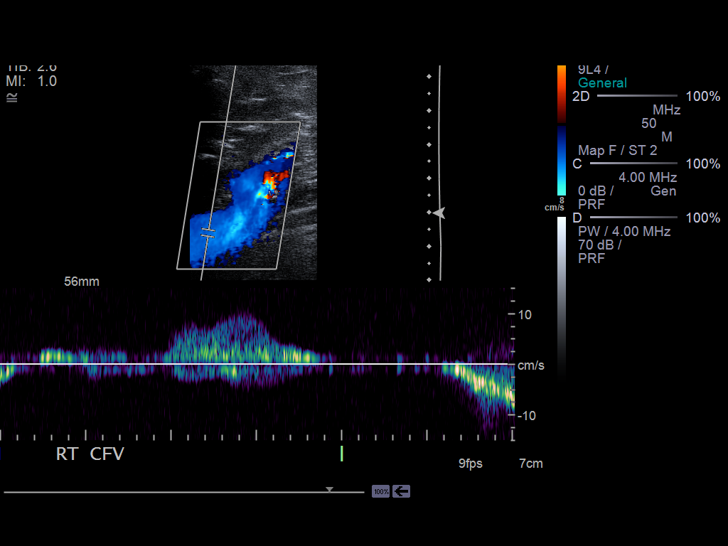
[im 10/26]
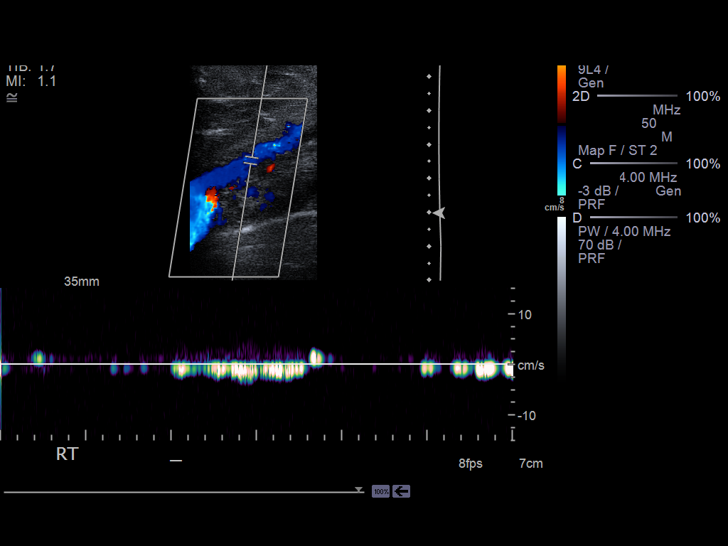
[im 12/26]
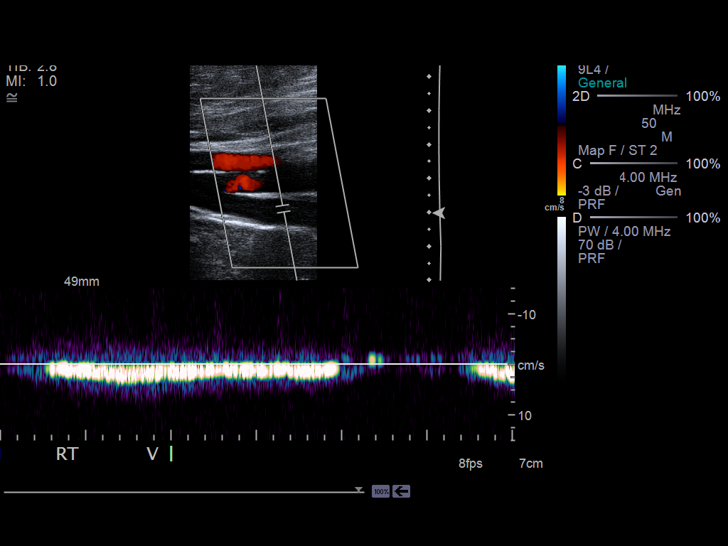
[im 14/26]
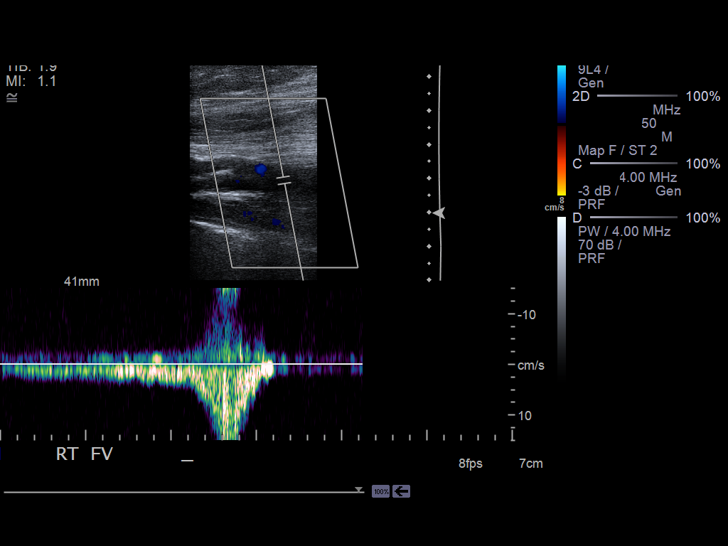
[im 16/26]
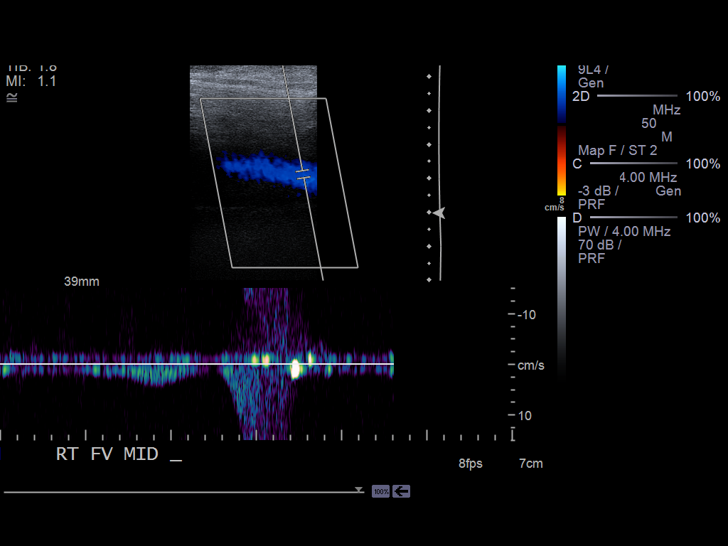
[im 18/26]
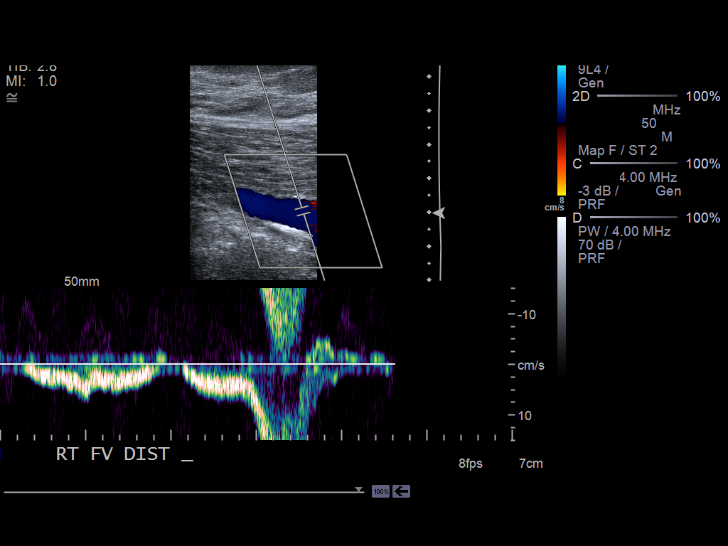
[im 20/26]
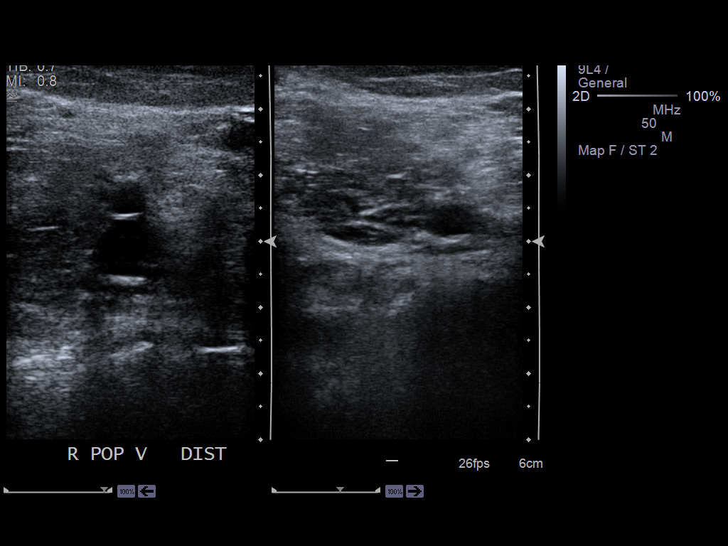
[im 21/26]
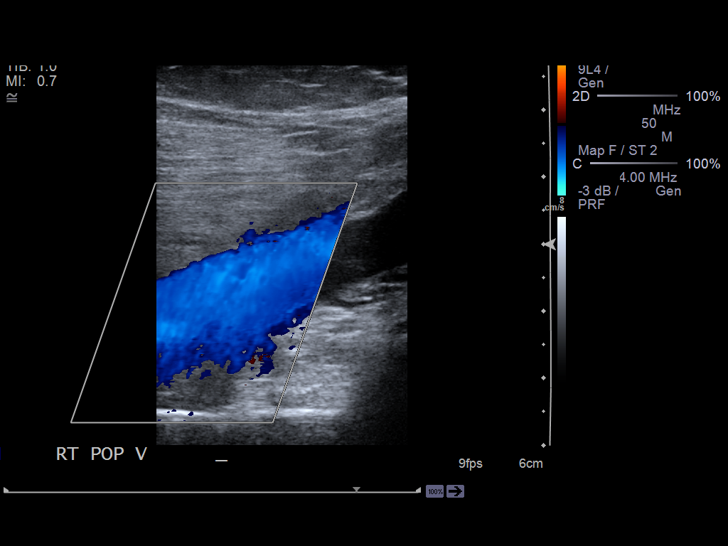
[im 23/26]
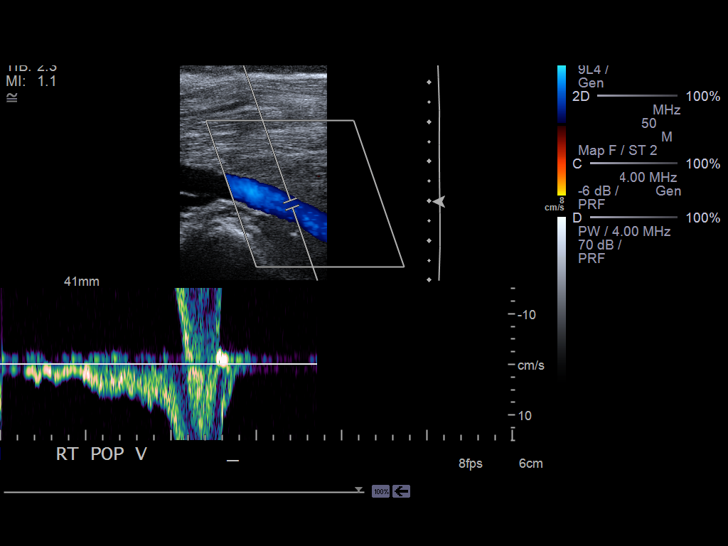
[im 26/26]
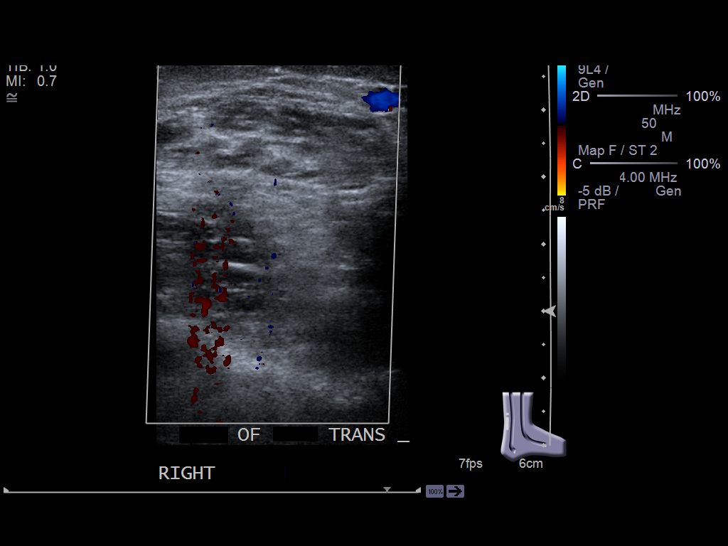

[14 of 24 positions shown; findings below may reference images not displayed]

PROCEDURE:     US  - US DOPPLER LOW EXTR RIGHT  - [DATE] [DATE]

RESULT:     Technique: Gray scale, Duplex color flow and SPECTRAL waveform
imaging was performed of the deep venous structures of the right lower
extremity.

There is not evidence of increased echogenicity, non- compressibility,
abnormal waveform or abnormal grayscale flow with the interrogated deep
venous structures of the RIGHT lower extremity. There is appropriate
response to Valsalva and augmentation within the interrogated vessels.
IMPRESSION: 1. No sonographic evidence of a deep venous thrombus within the interrogated
vessels of the RIGHT lower extremity.

## 2013-01-11 ENCOUNTER — Ambulatory Visit: Payer: Self-pay | Admitting: Obstetrics & Gynecology

## 2013-01-11 IMAGING — US ULTRASOUND RIGHT BREAST
1 series · 13 of 25 positions shown · non-contrast
Comparison: None.

REASON FOR EXAM: RIGHT UIQ BRST LUMP
COMMENTS:

PROCEDURE:     US  - US BREAST RIGHT  - [DATE] [DATE]
RESULT:

[Series 1: ultrasound right breast · 0.10mm/px · 13 of 82 slices shown]
[im 1/82]
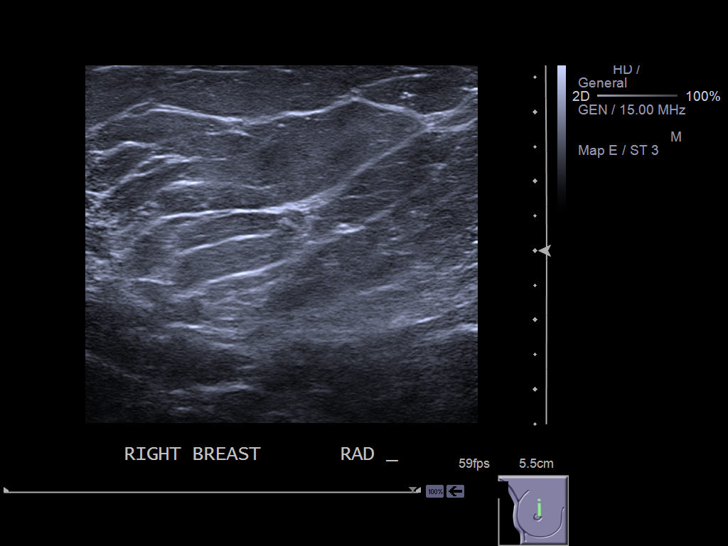
[im 7/82]
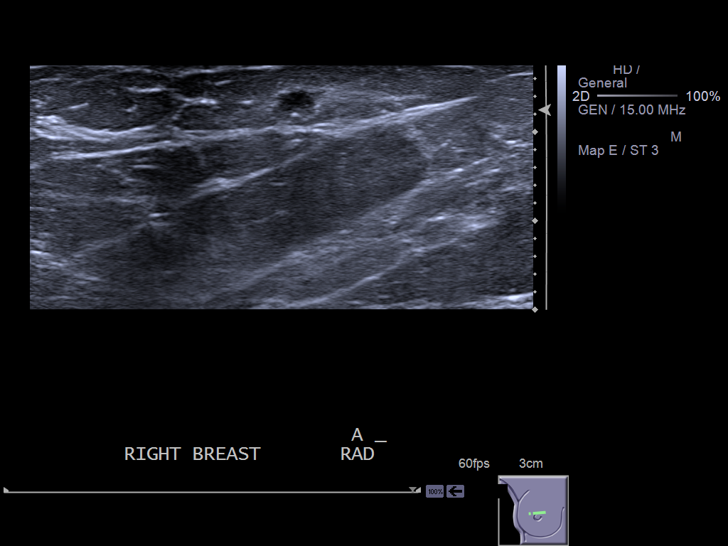
[im 14/82]
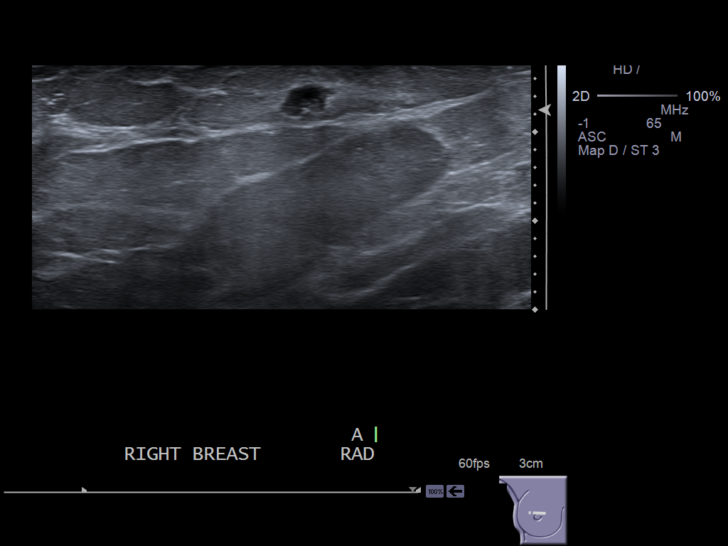
[im 21/82]
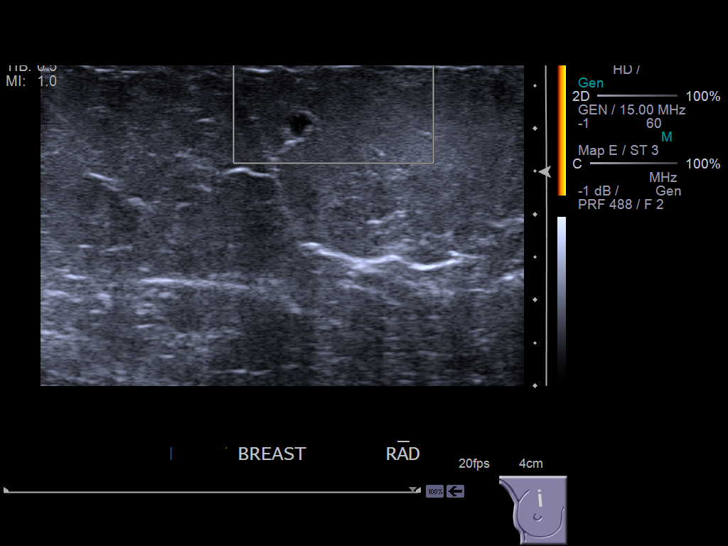
[im 28/82]
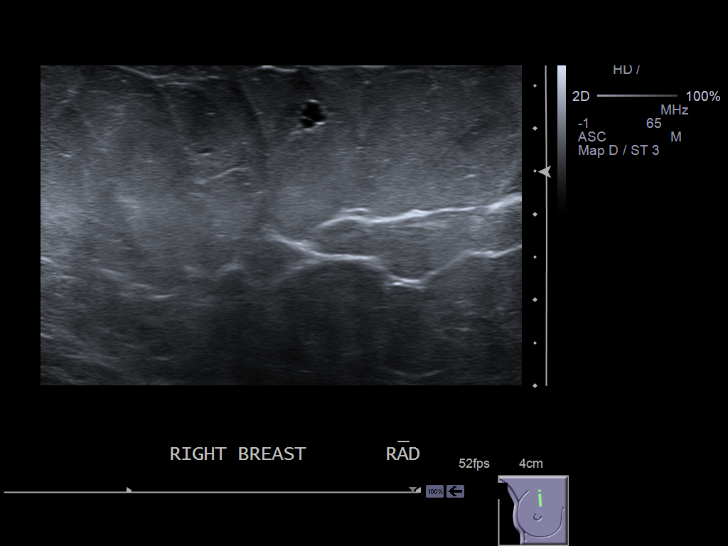
[im 34/82]
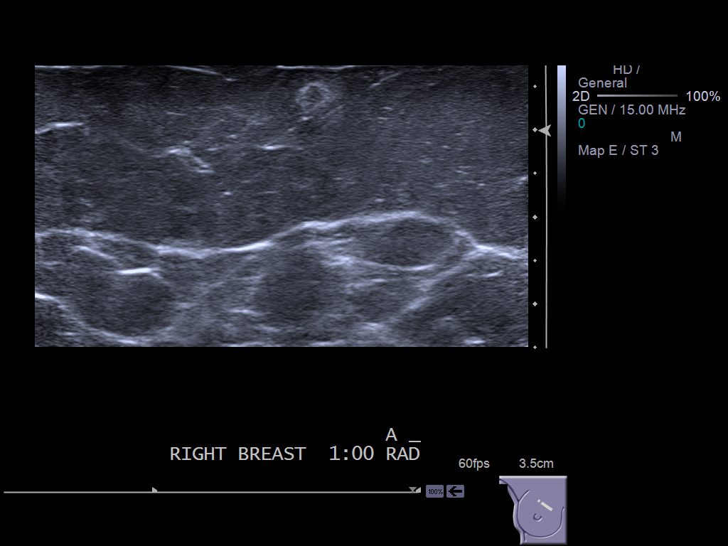
[im 41/82]
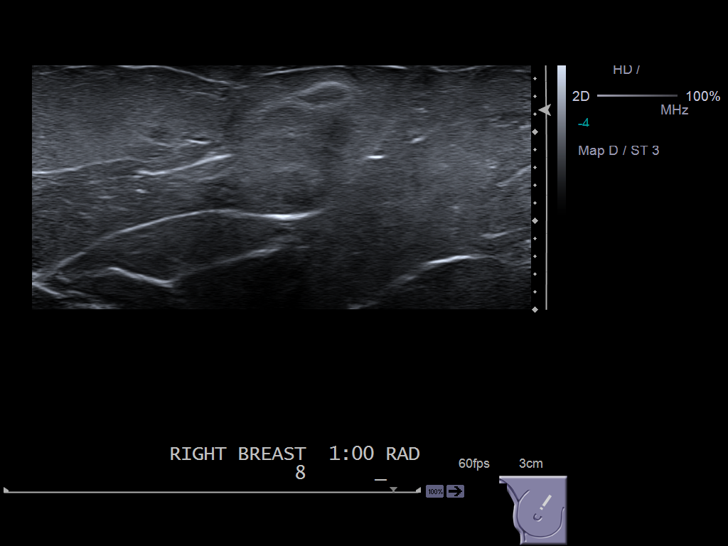
[im 48/82]
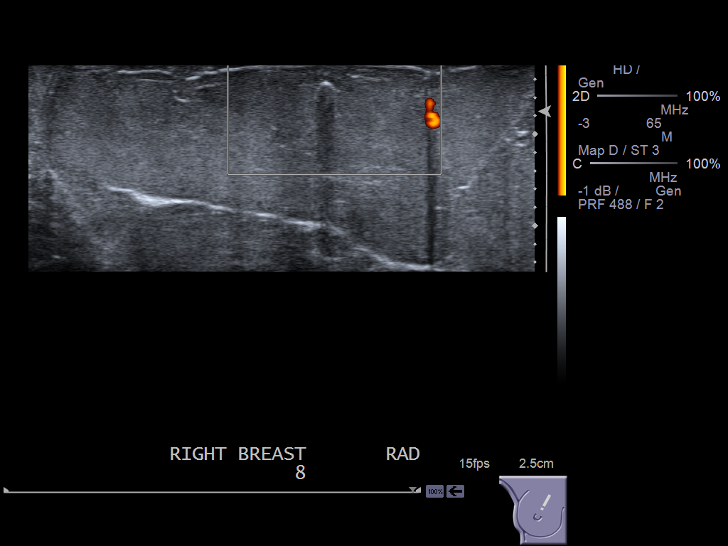
[im 55/82]
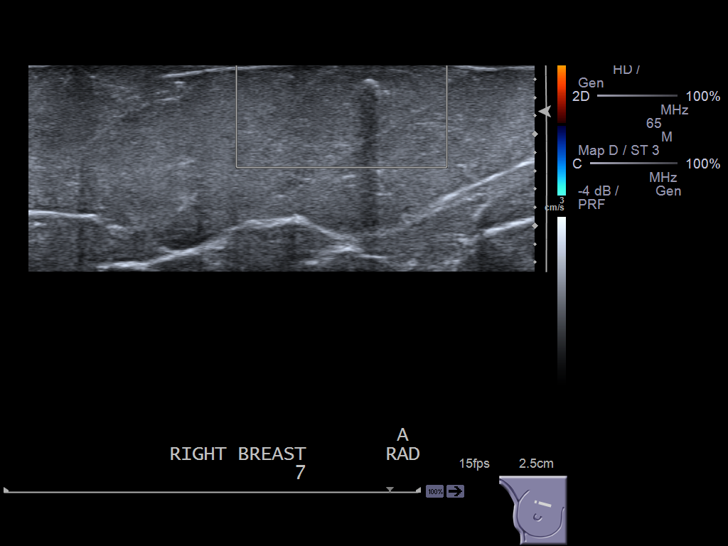
[im 61/82]
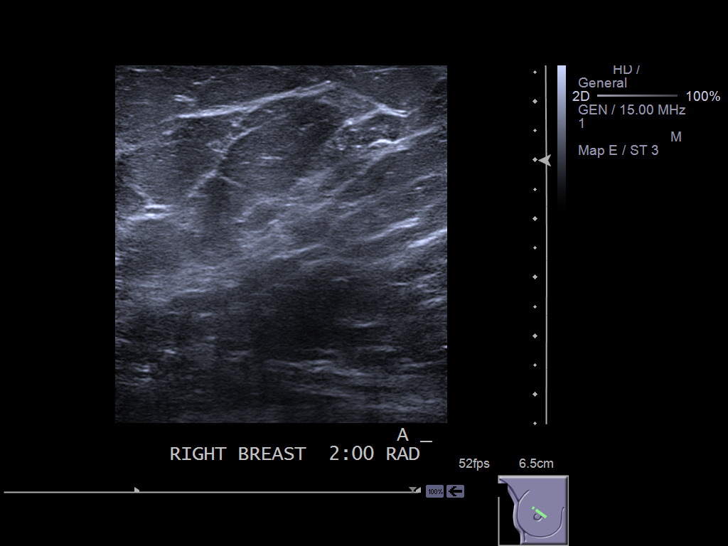
[im 68/82]
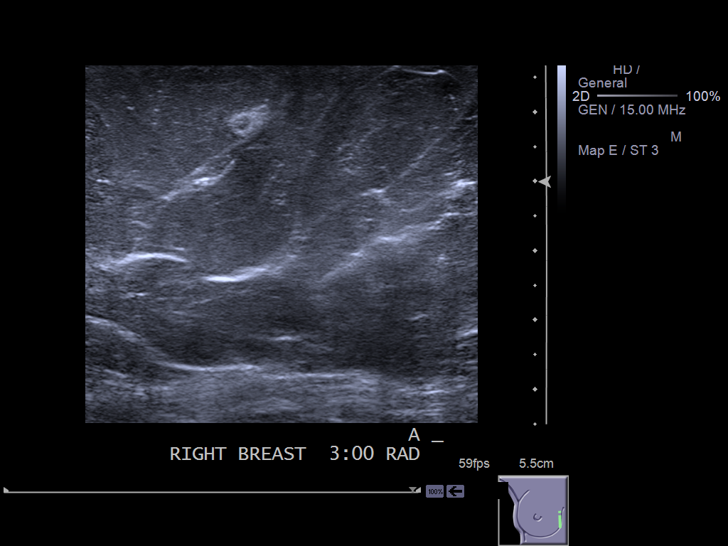
[im 75/82]
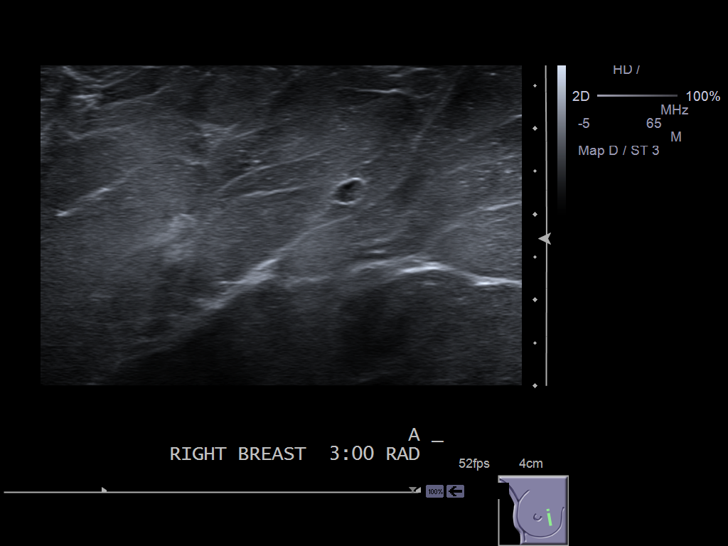
[im 82/82]
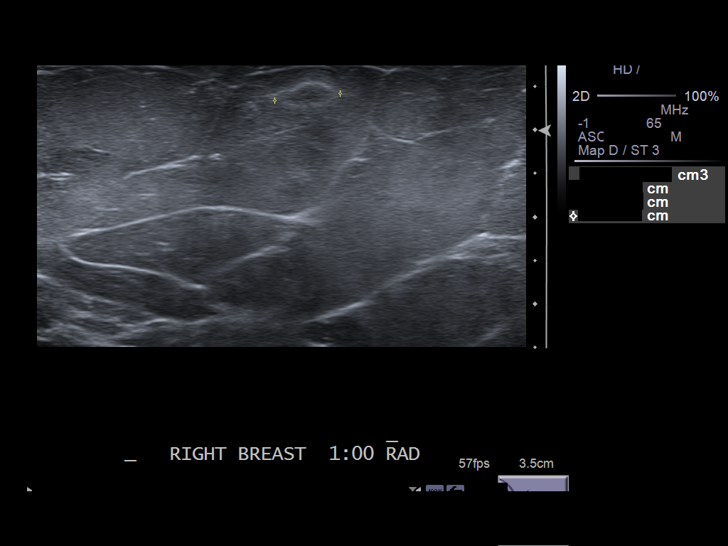

[13 of 25 positions shown; findings below may reference images not displayed]

FINDINGS: Real-time ultrasound was performed of the upper inner quadrant of the right
breast from [DATE] through [DATE] secondary to reported palpable abnormality in
this region. Multiple subcentimeter masses are identified in the upper inner
quadrant. At [DATE] there is a lobulated, hypoechoic mass which measures
x 0.3 x 0.3 cm. There is no significant posterior acoustic shadowing or
enhancement. Also at [DATE], there is a small, oval mass which is nearly
anechoic. It measures 0.5 x 0.3 x 0.3 cm. It is difficult to characterize
given its very small size, but could be a cluster of cysts. At [DATE] there is
a small mass that demonstrates both hyperechogenicity as well as
hypoechogenicity. The borders are somewhat ill-defined secondary to the
hyperechogenicity. It measures 0.8 x 0.4 x 0.4 cm. At [DATE] there is a
small, hypoechoic mass with hyperechogenicity along the superficial margin
and posterior acoustic shadowing. The imaging appearance suggests an oil
cyst, as several oil cysts are seen on the mammograms. It measures 0.3 x
x 0.2 cm. At [DATE] there is a small, hypoechoic mass which measures 0.3 x
x 0.2 cm. Also at [DATE], there is a small, hypoechoic mass with peripheral
hyperechogenicity. The borders are somewhat ill-defined.  Measurements were
not obtained during the examination, but it measures 0.5 x 0.4 cm in the
antiradial plane.

Diagnostic mammograms were performed of the right breast secondary to the
multiple small masses seen by ultrasound. There is scattered fibroglandular
tissue. No suspicious masses or calcifications are identified. There are
several benign oil cysts in the upper inner quadrant of the right breast.
IMPRESSION: 1.     BI-RADS: Category 4 - Suspicious Abnormality.
2.     Surgical  consultation is suggested. There are multiple, small,
indeterminant masses in the upper inner quadrant of the right breast. These
are all subcentimeter in size. Tissue diagnosis could be attempted of one or
more of the larger masses. At a minimum, follow-up ultrasound is recommended
to ensure stability.
3.     It is unclear in discussion with the ultrasound technologist, after
her discussion with the patient, if there is and area of palpable concern in
the left breast. If there is a region of palpable concern in the left
breast, diagnostic ultrasound could be performed of this region.

## 2013-01-18 ENCOUNTER — Ambulatory Visit: Payer: Self-pay | Admitting: Surgery

## 2013-01-18 IMAGING — US ULTRASOUND LEFT BREAST
1 series · 13 of 13 positions shown · non-contrast
Comparison: none

REASON FOR EXAM: LT BR LUMP 3 OCLOCK
COMMENTS:

PROCEDURE:     US  - US BREAST LEFT  - [DATE]  [DATE]
RESULT:     Limited ultrasound of the left breast is performed in the area
of clinical concern reported at [DATE]. The images demonstrate no solid or
cystic mass. No shadowing from calcification is evident.

[Series 1: ultrasound left breast · 0.12mm/px · 13 of 13 slices shown]
[im 1/13]
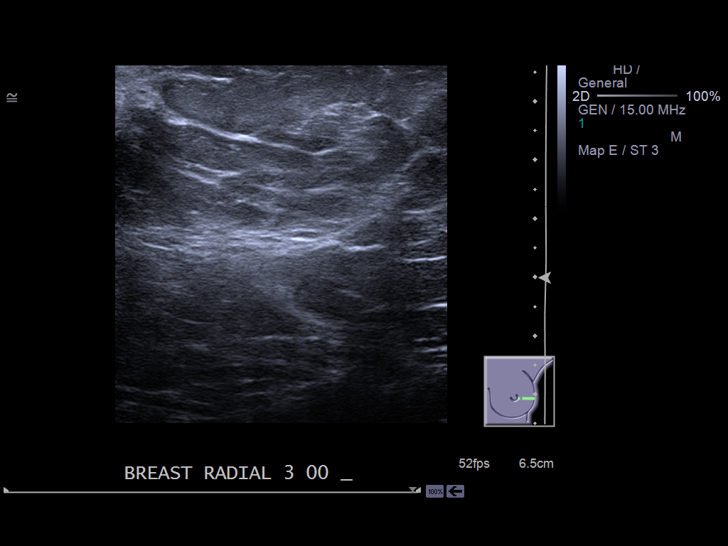
[im 2/13]
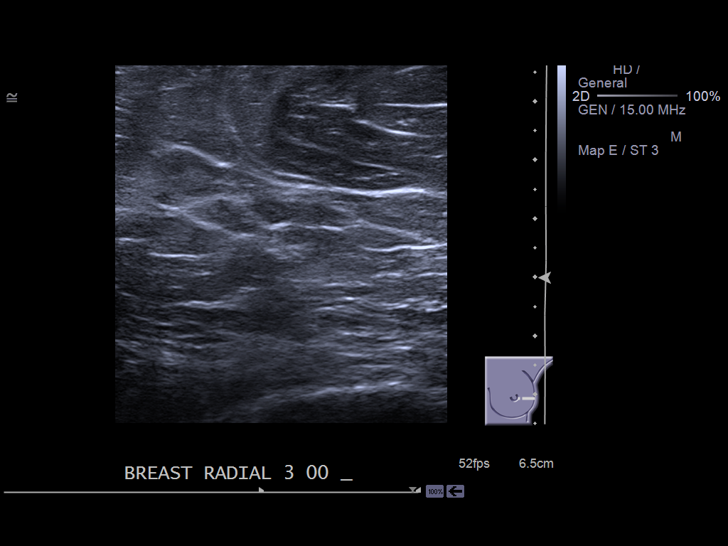
[im 3/13]
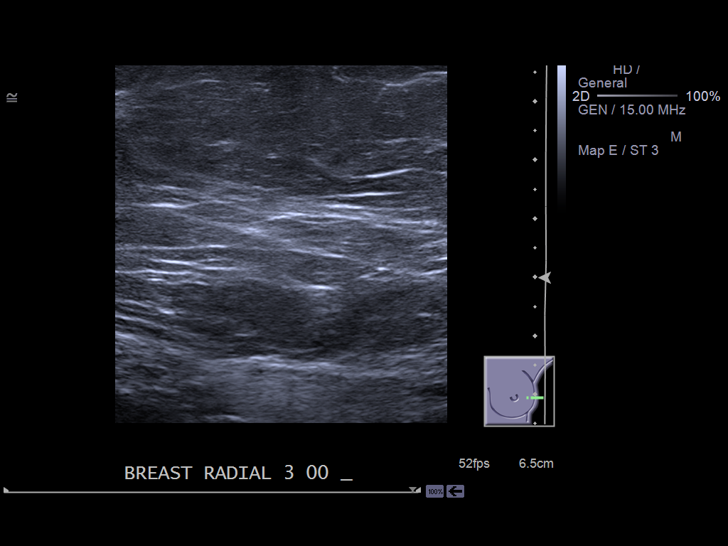
[im 4/13]
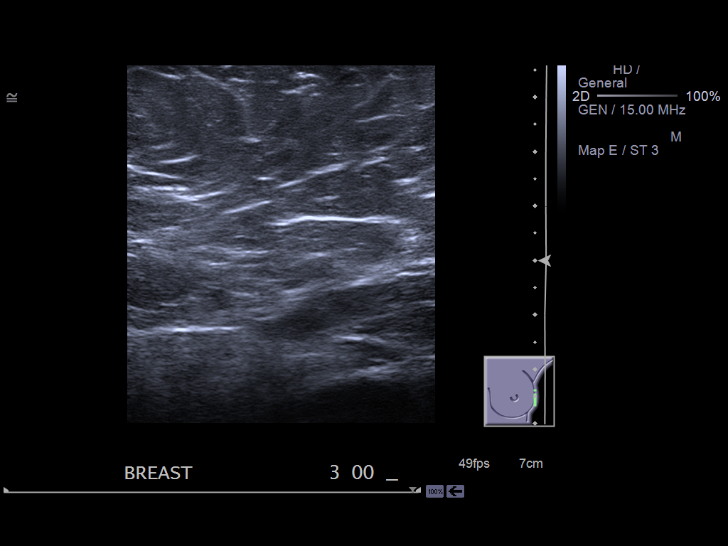
[im 5/13]
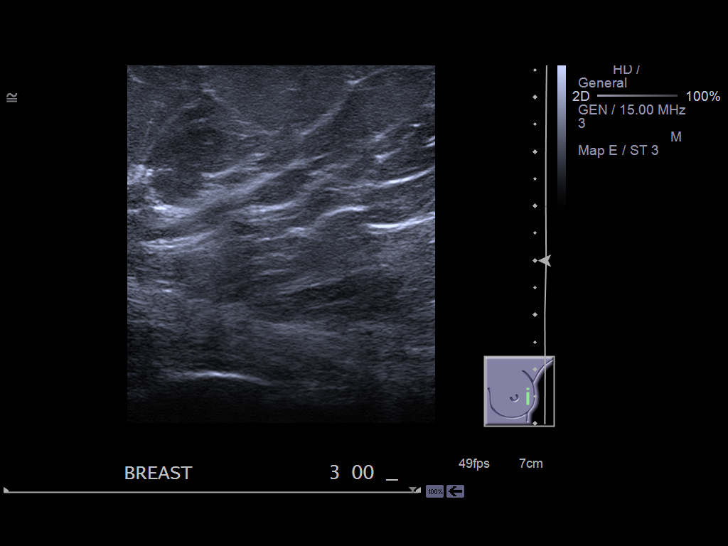
[im 6/13]
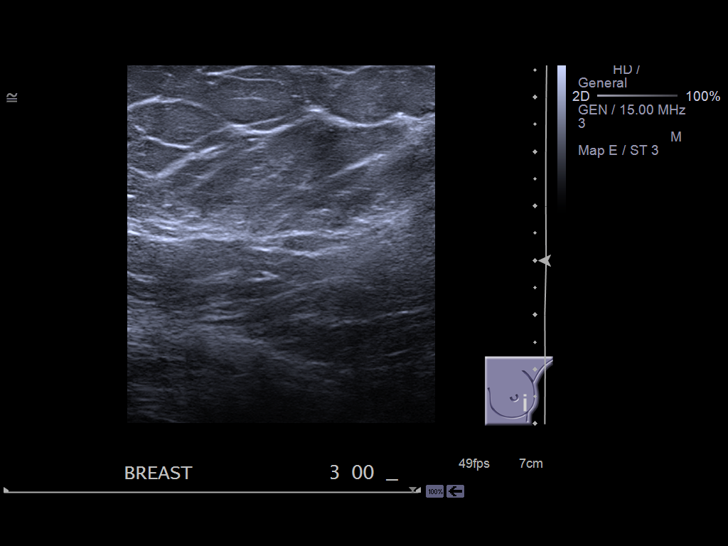
[im 7/13]
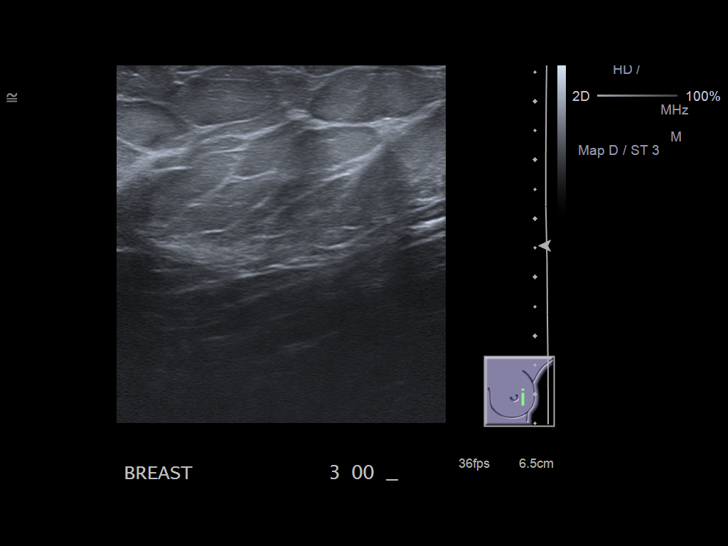
[im 8/13]
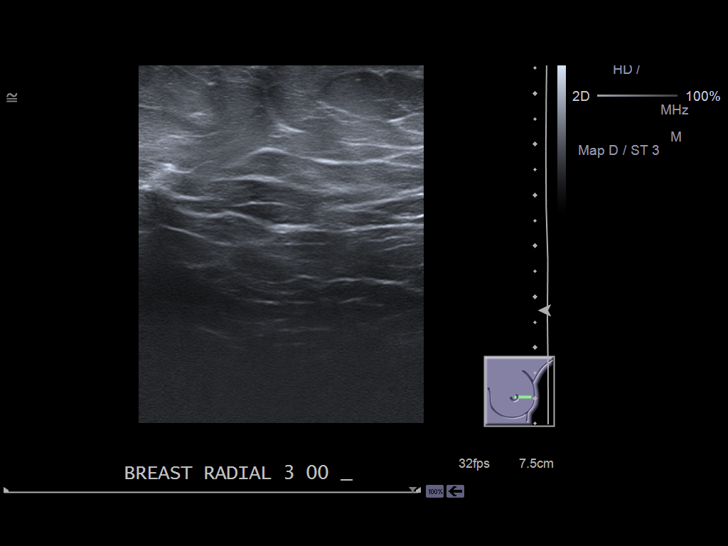
[im 9/13]
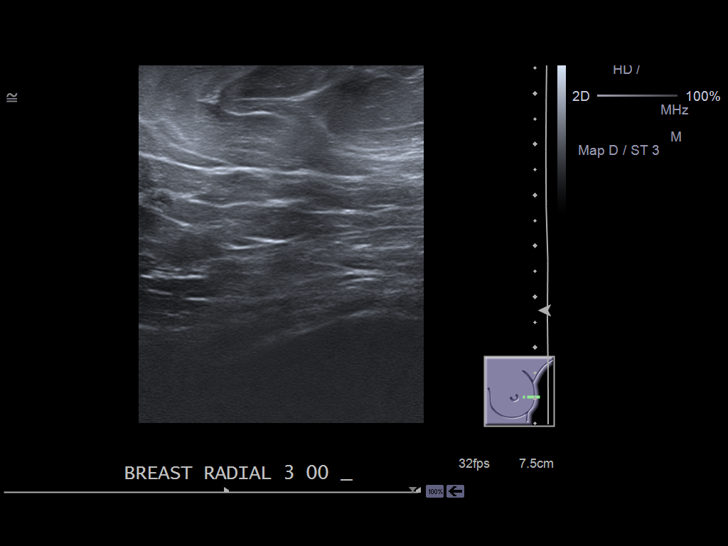
[im 10/13]
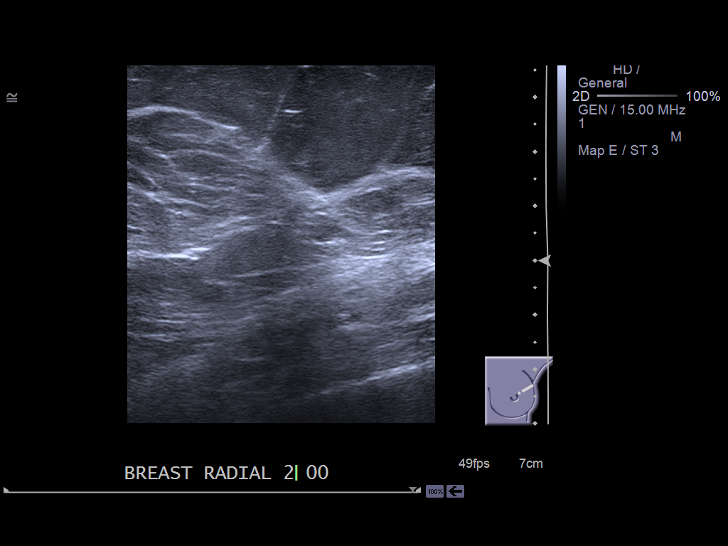
[im 11/13]
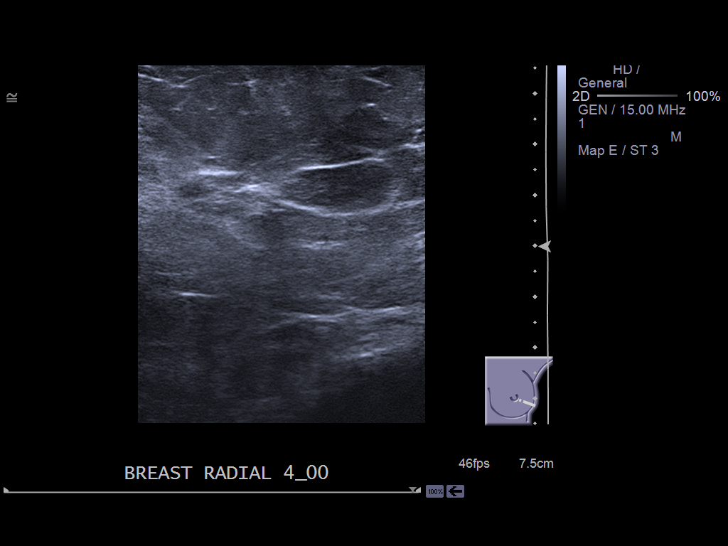
[im 12/13]
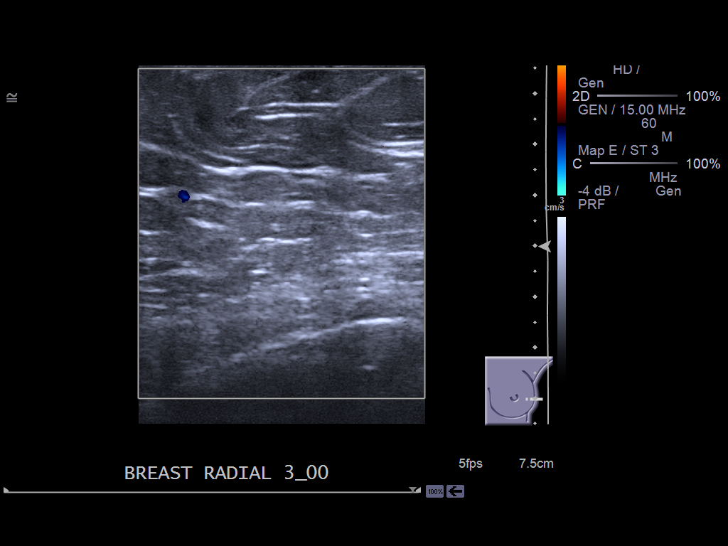
[im 13/13]
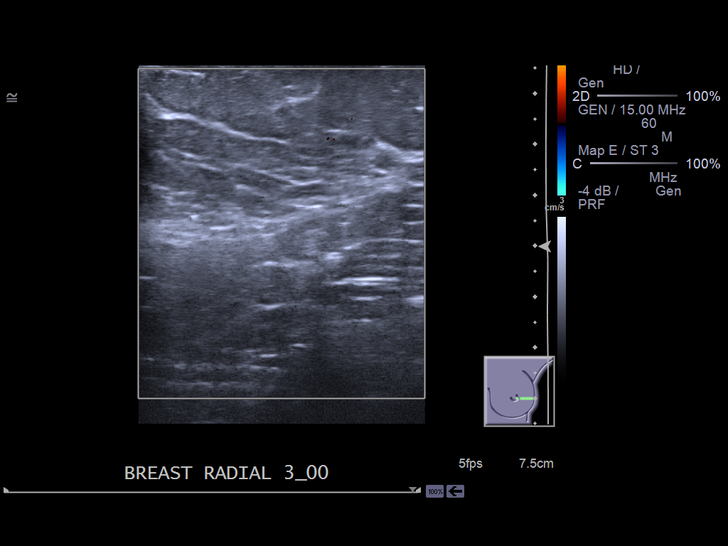

[13 of 13 positions shown; findings below may reference images not displayed]

IMPRESSION: Negative targeted [DATE] left breast ultrasound.

[REDACTED]

## 2013-02-02 ENCOUNTER — Ambulatory Visit: Payer: Self-pay | Admitting: Surgery

## 2013-02-02 LAB — HCG, QUANTITATIVE, PREGNANCY: Beta Hcg, Quant.: <1 m[IU]/mL — ABNORMAL LOW

## 2013-02-02 IMAGING — US US  BREAST BX W/ LOC DEV 1ST LESION IMG BX SPEC US GUIDE*R*
1 series · 6 of 6 positions shown · non-contrast
Comparison: [DATE]

REASON FOR EXAM: R breast mass
COMMENTS:

PROCEDURE:     US  - US GUIDED BIOPSY BREAST RIGHT  - [DATE]  [DATE]
RESULT:     Indication: Right breast mass

[Series 1: us breast bx w/ loc dev 1st lesion img bx spec us  · 0.08mm/px · 6 of 6 slices shown]
[im 1/6]
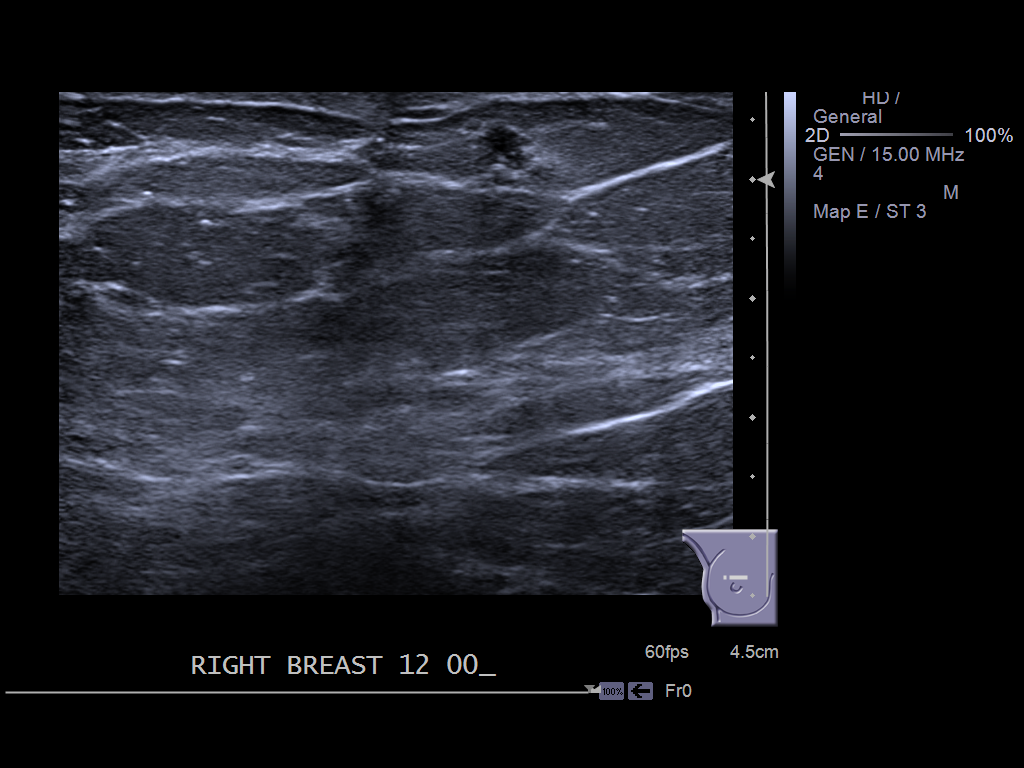
[im 2/6]
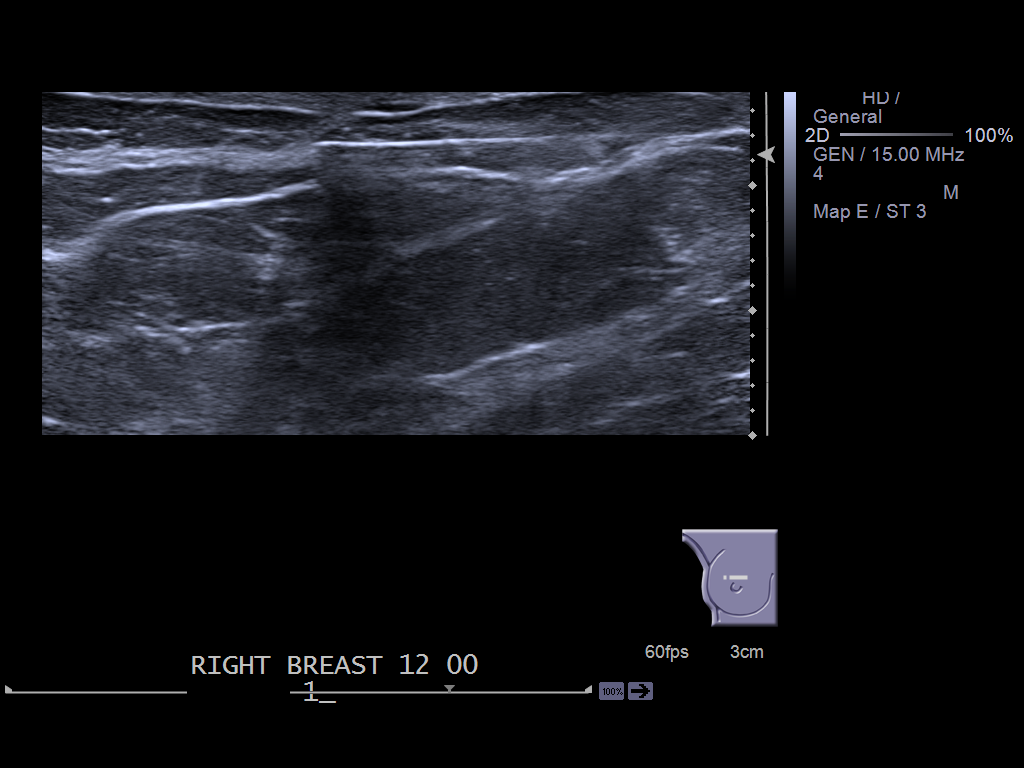
[im 3/6]
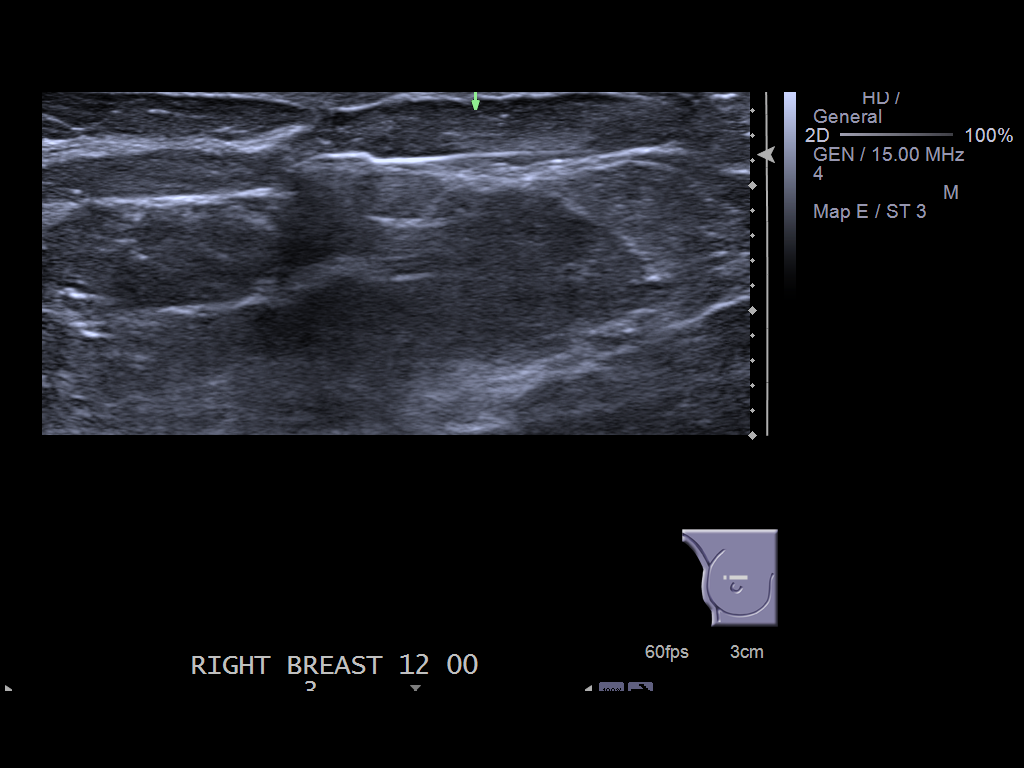
[im 4/6]
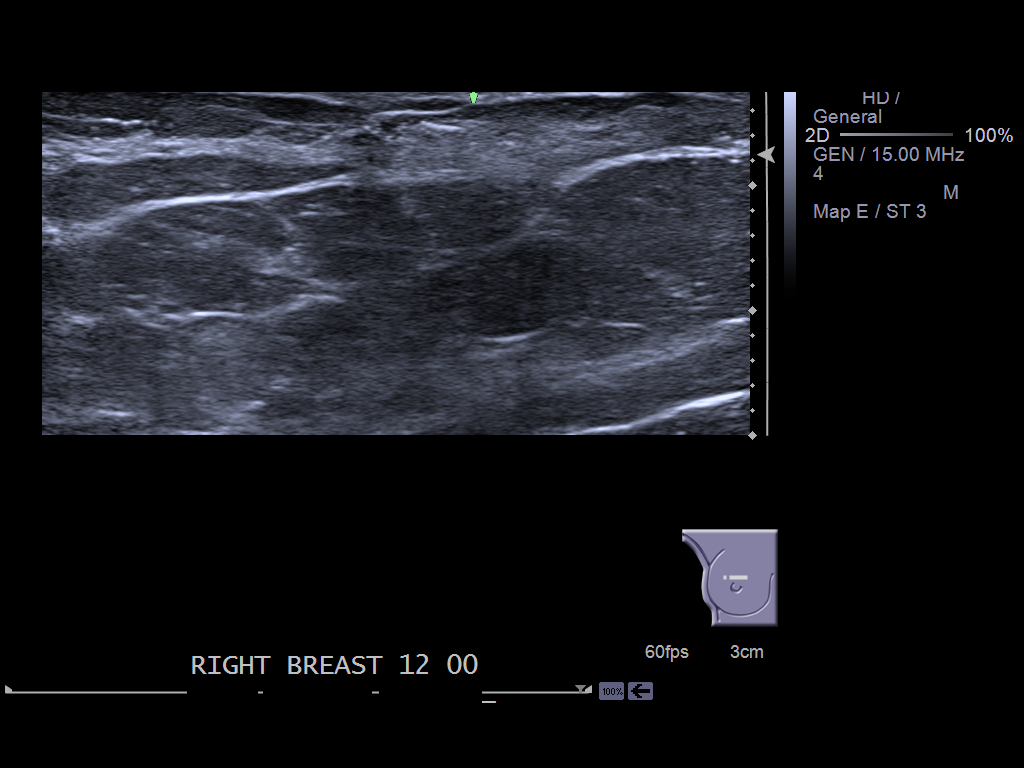
[im 5/6]
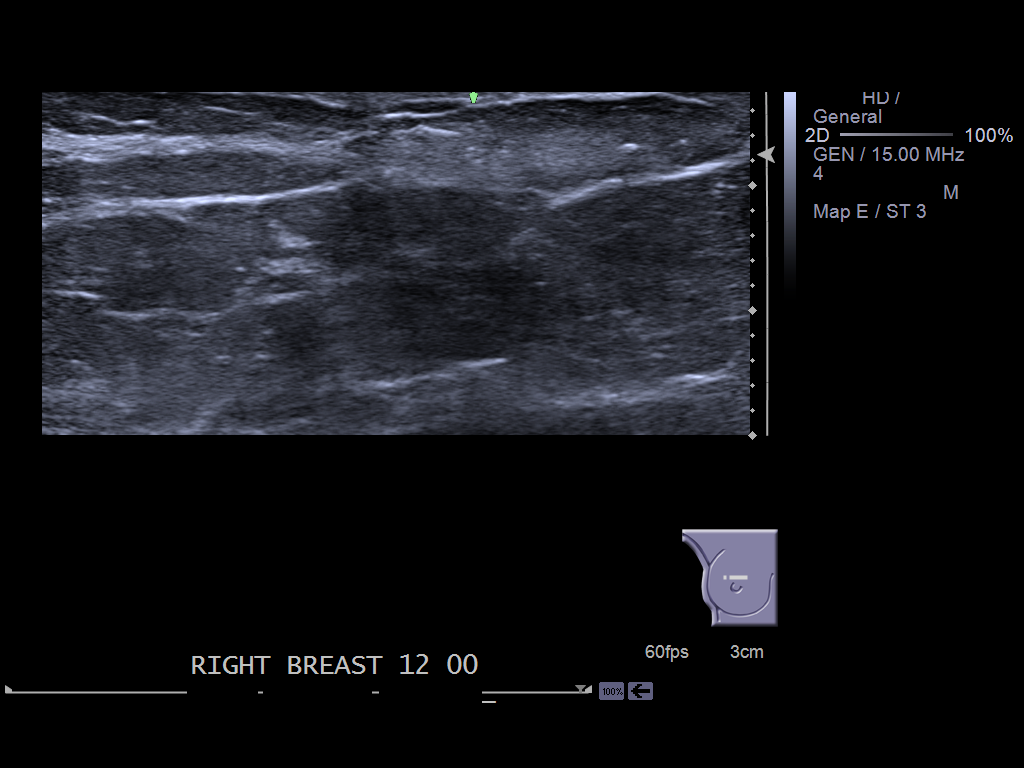
[im 6/6]
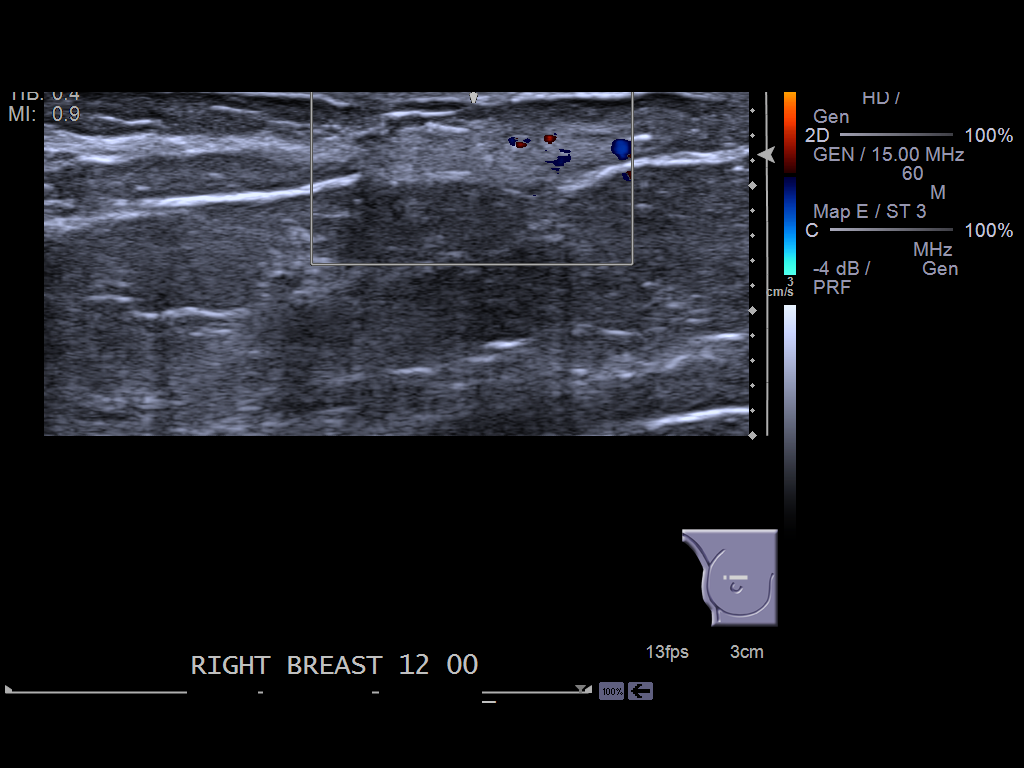

[6 of 6 positions shown; findings below may reference images not displayed]

Consent: After the procedure and its risks including infection, bleeding and
allergy were discussed with the patient and all questions answered, informed
consent was signed.

Procedure: The patient was placed supine on the ultrasound with the right
arm in ABER position. A "timeout" was performed to verify the correct
patient and correct procedure. Initial ultrasound of the right breast
demonstrated a hypoechoic mass at the [DATE] position. The patient was then
prepped and draped in the usual sterile fashion. Realtime ultrasound was
used to localize the mass and a biopsy path was selected. The skin was
marked and 1% lidocaine was infiltrated into skin and soft tissues along the
biopsy tract up to the mass for local anesthesia.

A small skin incision with a #11 blade was made at the skin entry site. A
[PA] coaxial needle was advanced to the margin of the mass. Subsequently,
3 core biopsy specimens were obtained using an Achieve 18 gauge needle in
semiautomatic mode. Needle passes into the mass were observed under realtime
and recorded. Following the third core biopsy the mass is no longer
appreciated. A biopsy clip was placed at the site of biopsy. The needle was
removed, hemostasis achieved and a band-aid applied to the skin incision.
The patient tolerated the procedure well without immediate complication and
was discharged in good condition after 15 min observation.
IMPRESSION: Successful right breast mass core biopsy.

[REDACTED]

## 2013-02-02 IMAGING — MG UNKNOWN MG STUDY
1 series · 3 of 3 positions shown · non-contrast
Comparison: [DATE]

REASON FOR EXAM: R breast mass
COMMENTS:

PROCEDURE:     US  - US GUIDED BIOPSY BREAST RIGHT  - [DATE]  [DATE]
RESULT:     Indication: Right breast mass

[R CC · right · 3 of 3 slices shown]
[im 1/3]
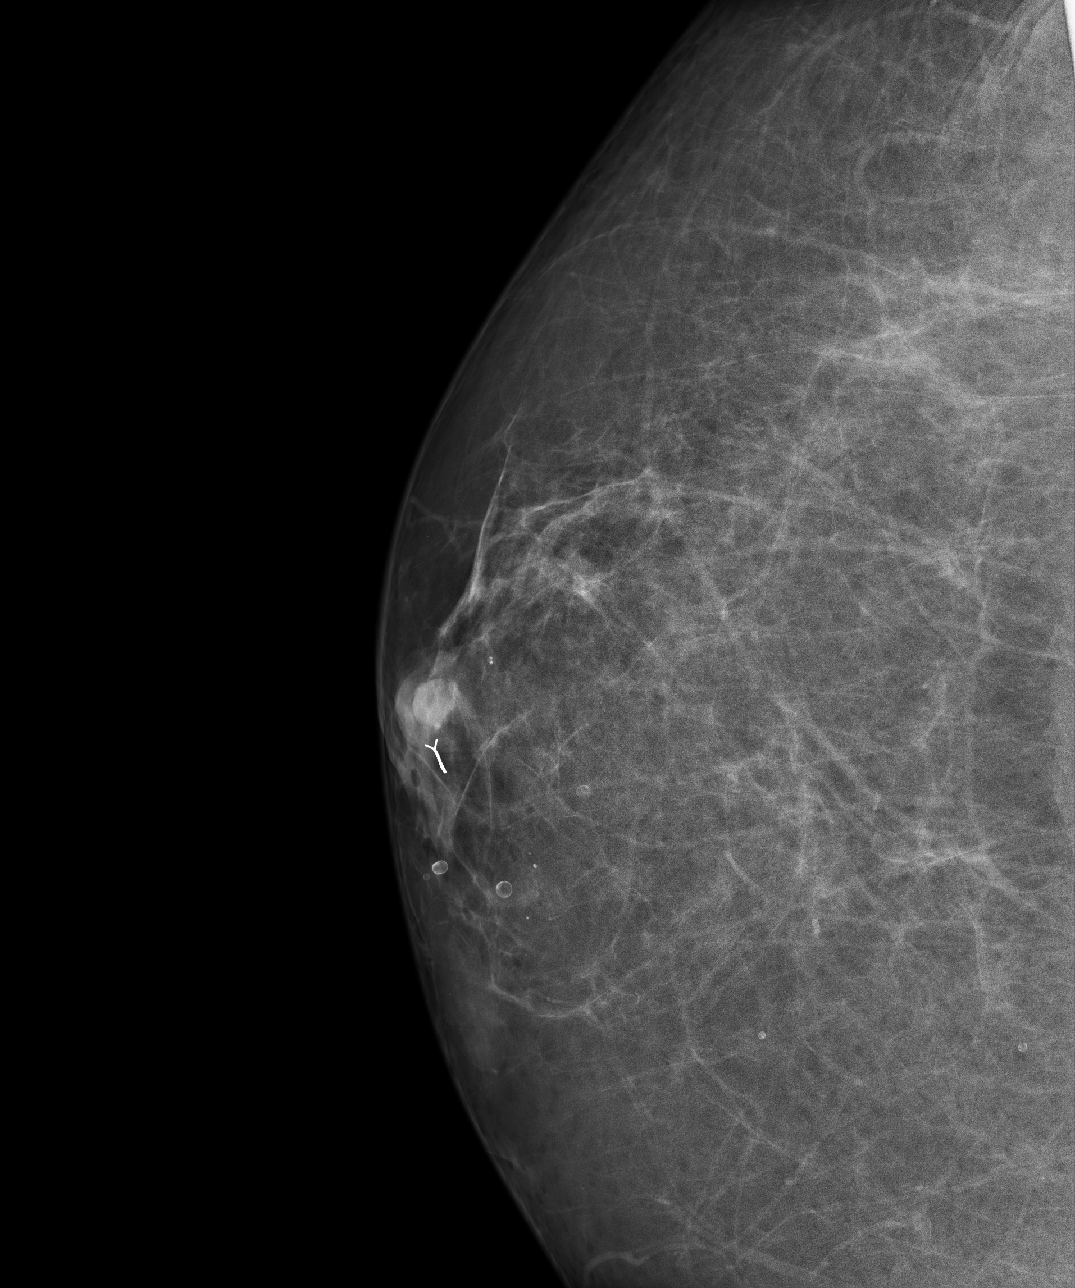
[im 2/3]
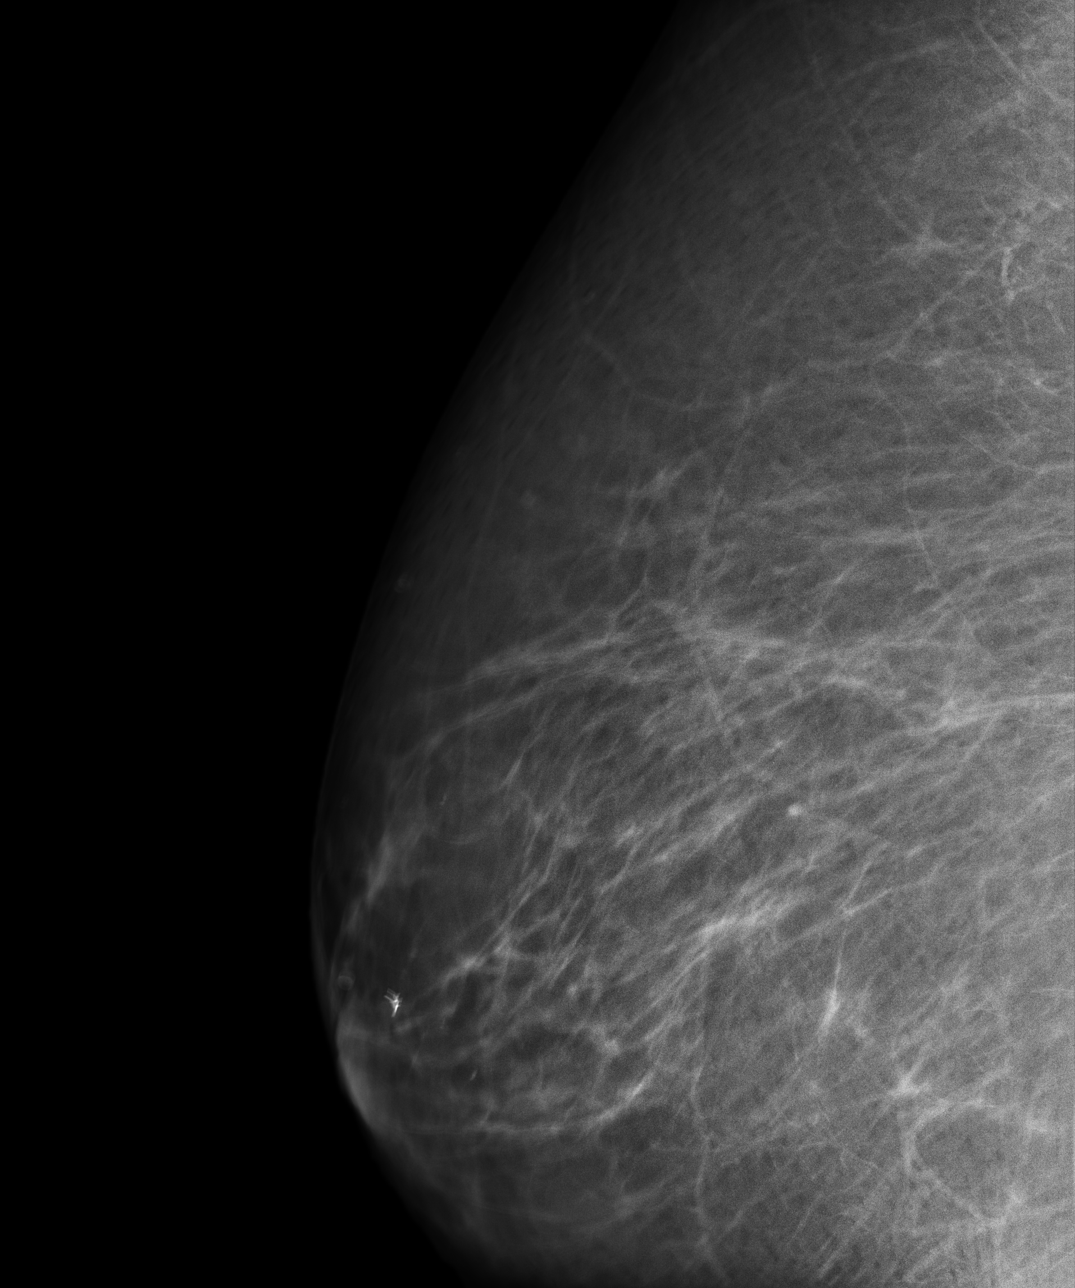
[im 3/3]
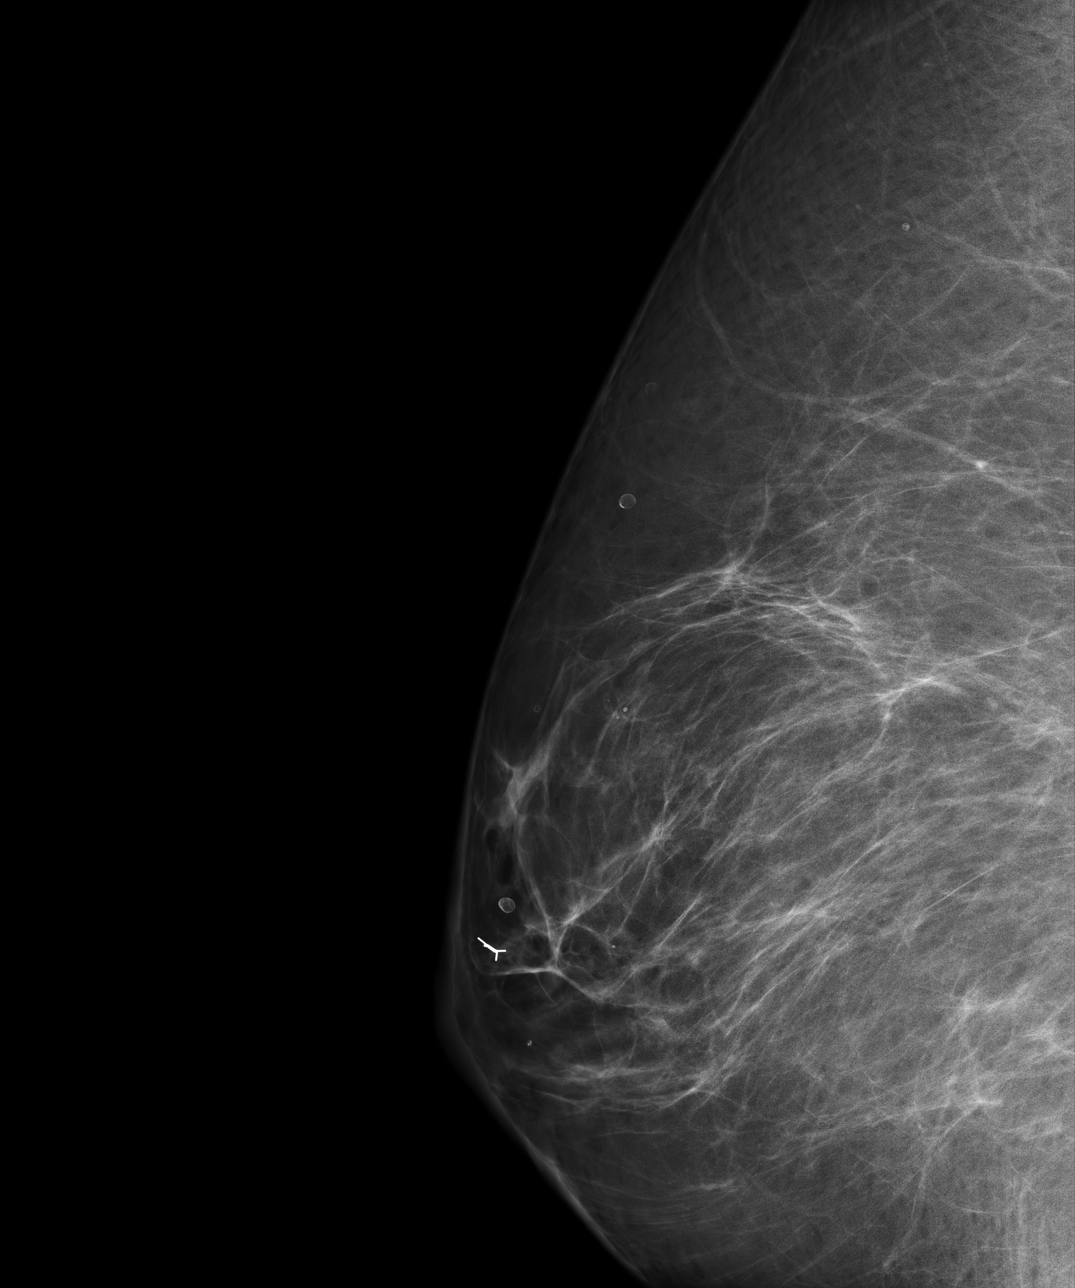

[3 of 3 positions shown; findings below may reference images not displayed]

Consent: After the procedure and its risks including infection, bleeding and
allergy were discussed with the patient and all questions answered, informed
consent was signed.

Procedure: The patient was placed supine on the ultrasound with the right
arm in ABER position. A "timeout" was performed to verify the correct
patient and correct procedure. Initial ultrasound of the right breast
demonstrated a hypoechoic mass at the [DATE] position. The patient was then
prepped and draped in the usual sterile fashion. Realtime ultrasound was
used to localize the mass and a biopsy path was selected. The skin was
marked and 1% lidocaine was infiltrated into skin and soft tissues along the
biopsy tract up to the mass for local anesthesia.

A small skin incision with a #11 blade was made at the skin entry site. A
[PA] coaxial needle was advanced to the margin of the mass. Subsequently,
3 core biopsy specimens were obtained using an Achieve 18 gauge needle in
semiautomatic mode. Needle passes into the mass were observed under realtime
and recorded. Following the third core biopsy the mass is no longer
appreciated. A biopsy clip was placed at the site of biopsy. The needle was
removed, hemostasis achieved and a band-aid applied to the skin incision.
The patient tolerated the procedure well without immediate complication and
was discharged in good condition after 15 min observation.
IMPRESSION: Successful right breast mass core biopsy.

[REDACTED]

## 2013-04-10 ENCOUNTER — Emergency Department: Payer: Self-pay | Admitting: Emergency Medicine

## 2013-04-10 LAB — COMPREHENSIVE METABOLIC PANEL
Alkaline Phosphatase: 55 U/L (ref 50–136)
Anion Gap: 7 (ref 7–16)
Chloride: 101 mmol/L (ref 98–107)
Co2: 27 mmol/L (ref 21–32)
Creatinine: 0.67 mg/dL (ref 0.60–1.30)
Glucose: 123 mg/dL — ABNORMAL HIGH (ref 65–99)
Osmolality: 271 (ref 275–301)
SGOT(AST): 23 U/L (ref 15–37)
SGPT (ALT): 39 U/L (ref 12–78)
Sodium: 135 mmol/L — ABNORMAL LOW (ref 136–145)
Total Protein: 7.3 g/dL (ref 6.4–8.2)

## 2013-04-10 LAB — URINALYSIS, COMPLETE
Glucose,UR: NEGATIVE mg/dL (ref 0–75)
Ph: 5 (ref 4.5–8.0)
Specific Gravity: 1.02 (ref 1.003–1.030)
Squamous Epithelial: 8
WBC UR: 3 /HPF (ref 0–5)

## 2013-04-10 LAB — CBC
HGB: 12.4 g/dL (ref 12.0–16.0)
MCV: 83 fL (ref 80–100)
Platelet: 230 10*3/uL (ref 150–440)
RBC: 4.39 10*6/uL (ref 3.80–5.20)
WBC: 7.1 10*3/uL (ref 3.6–11.0)

## 2013-04-10 IMAGING — CR DG CHEST 2V
1 series · 3 of 3 positions shown · non-contrast
Comparison: none

REASON FOR EXAM: SOB
COMMENTS:

PROCEDURE:     DXR - DXR CHEST PA (OR AP) AND LATERAL  - [DATE]  [DATE]
RESULT:     Comparison: [DATE]

[Series 1: pa · 0.17mm/px · 3 of 3 slices shown]
[im 1/3]
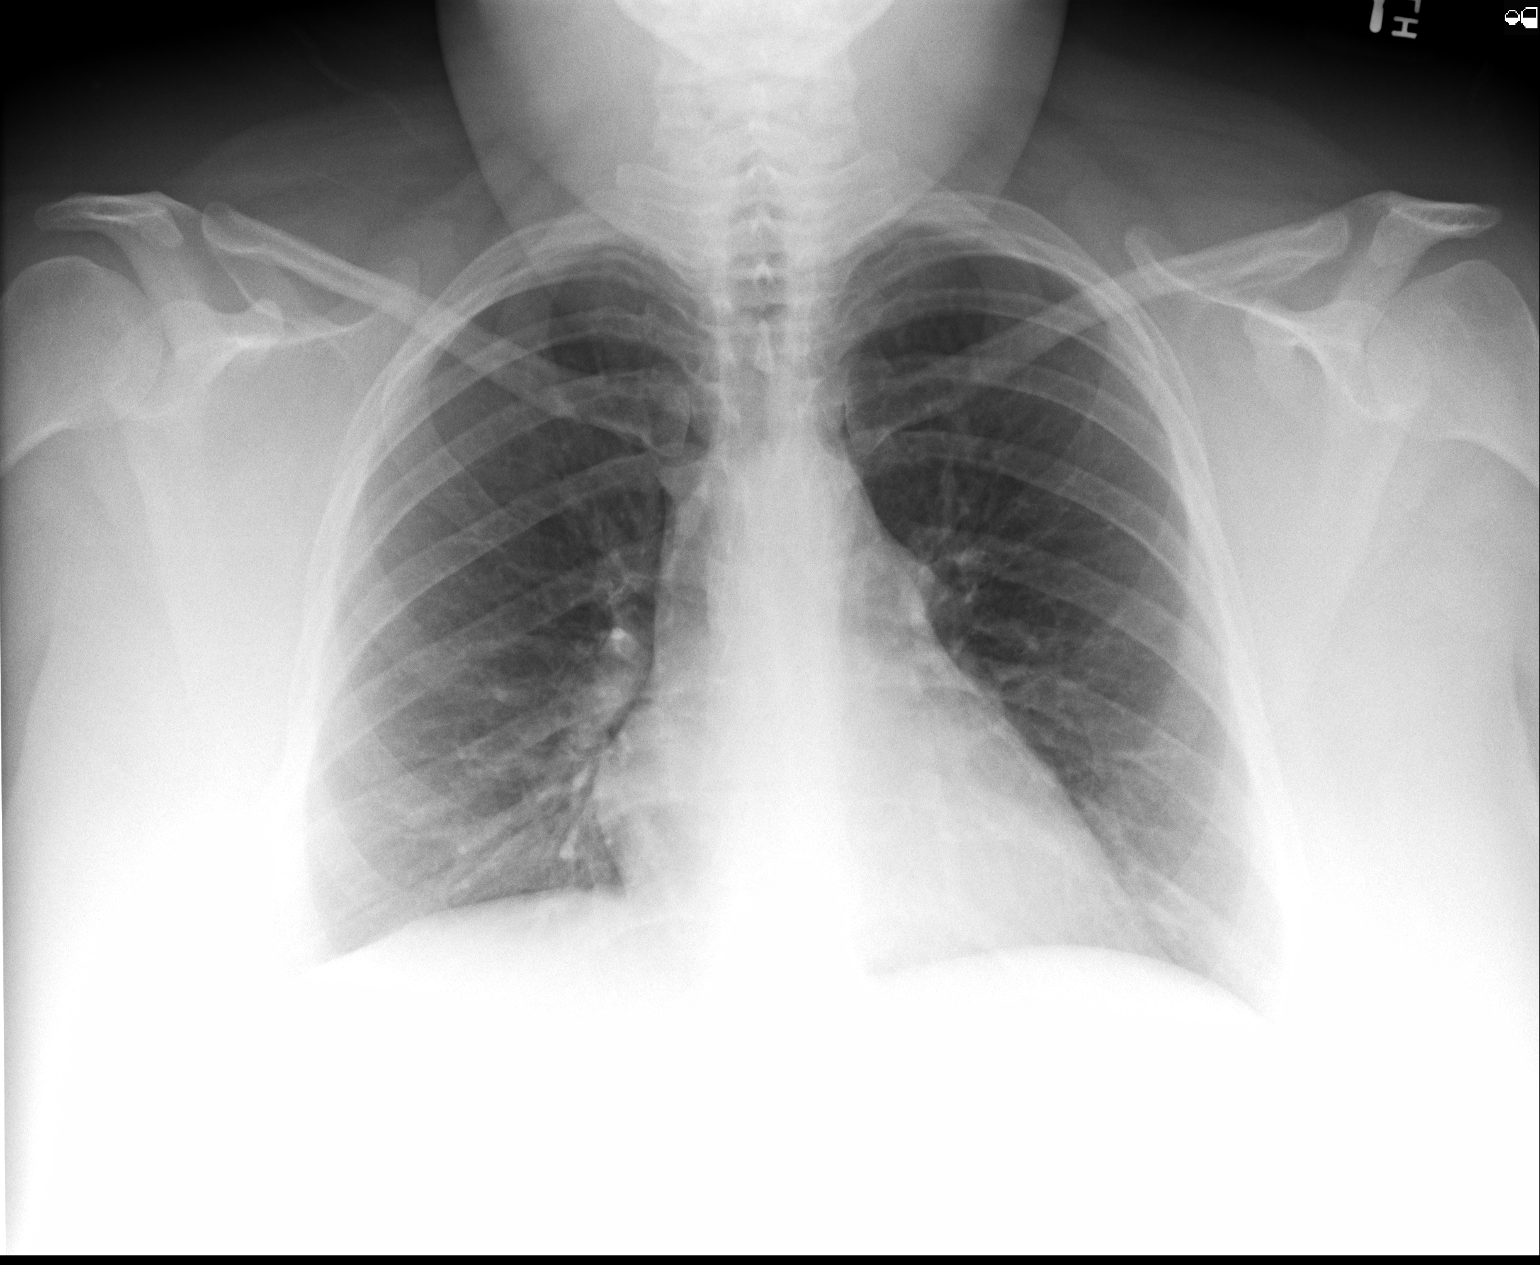
[im 2/3]
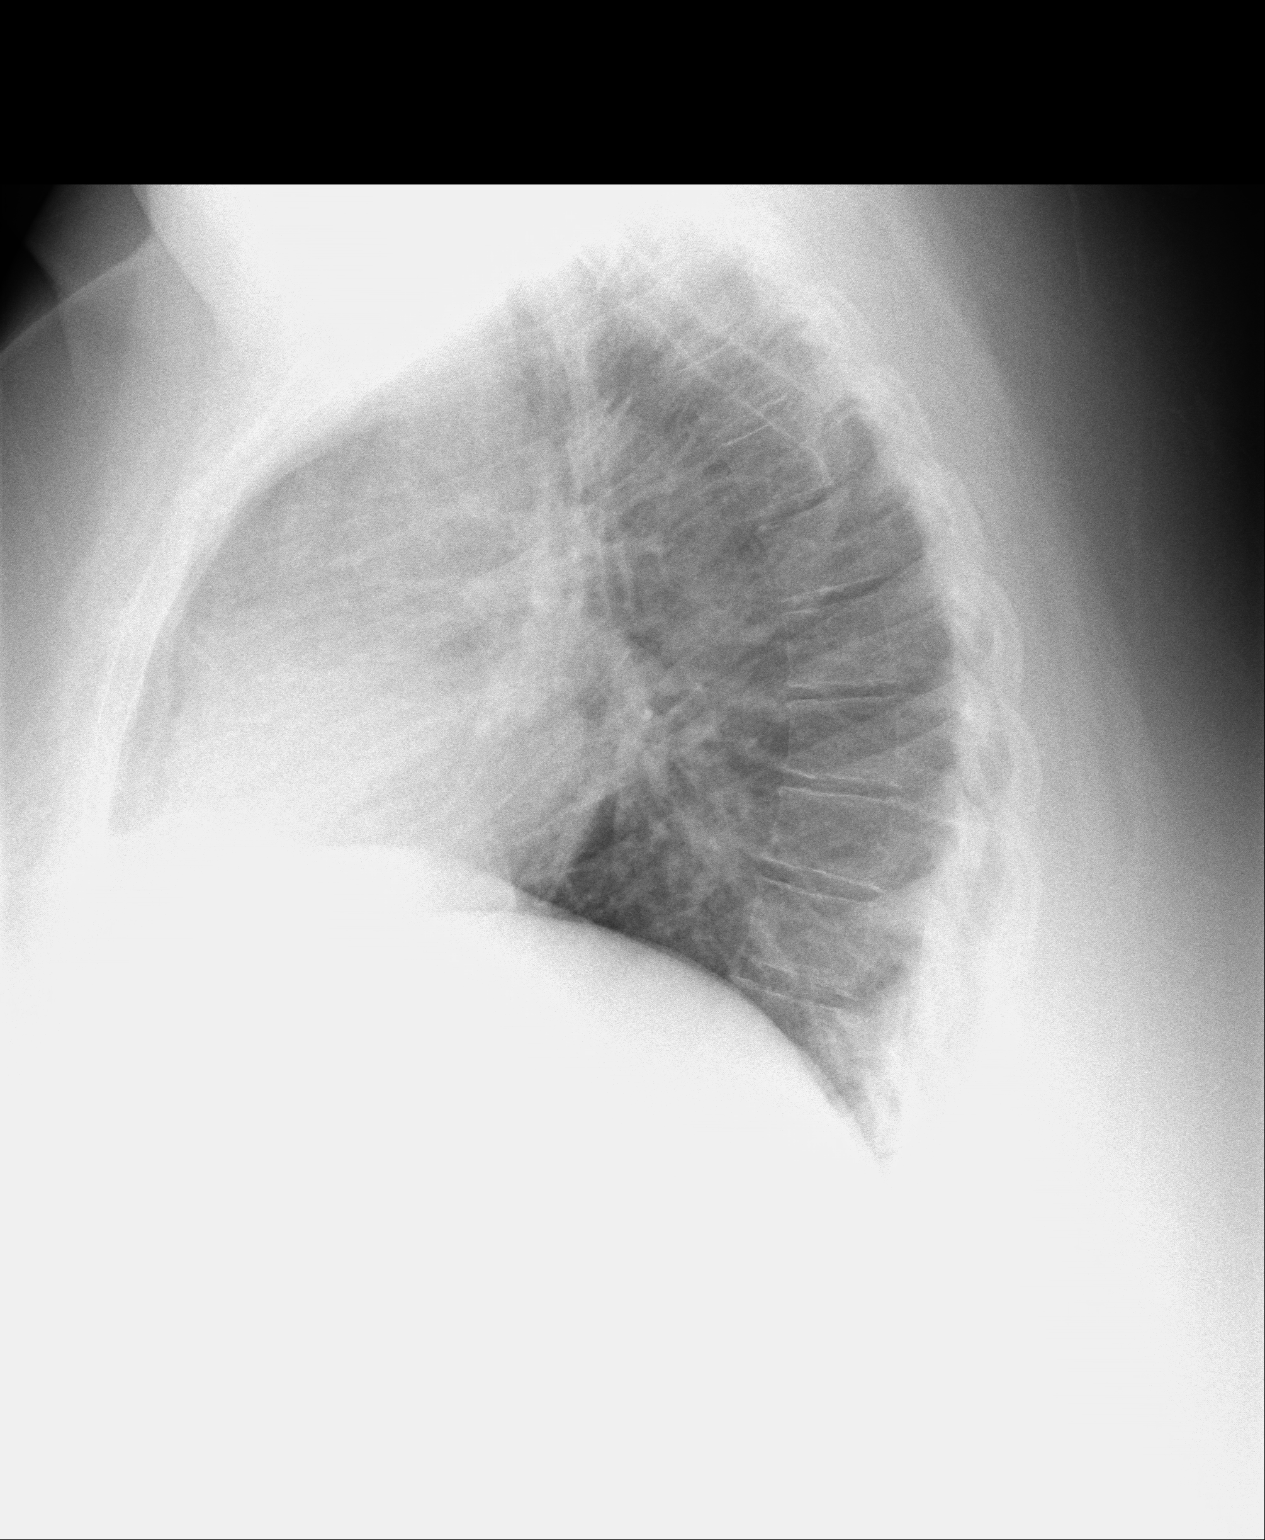
[im 3/3]
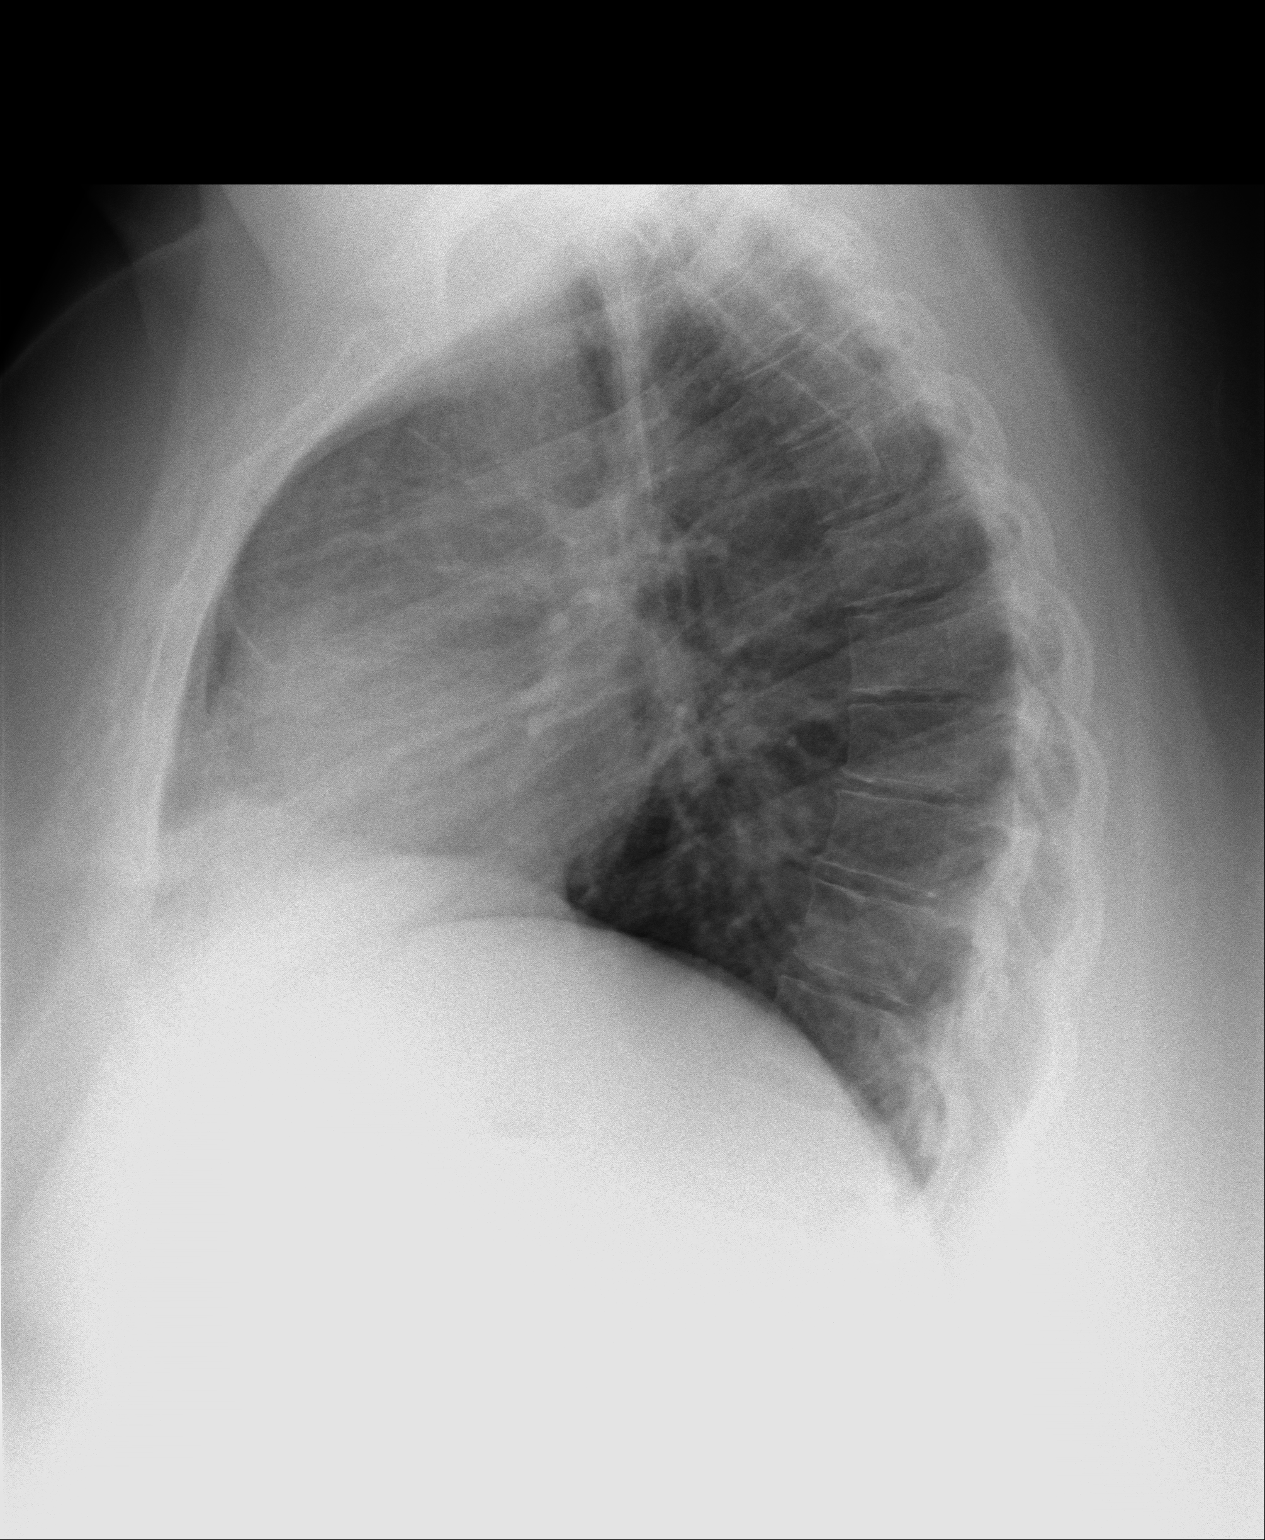

[3 of 3 positions shown; findings below may reference images not displayed]

FINDINGS: The heart and mediastinum are stable. The lung volumes are slightly
diminished. No focal pulmonary opacities.
IMPRESSION: No acute cardiopulmonary disease.

[REDACTED]

## 2013-04-20 ENCOUNTER — Other Ambulatory Visit: Payer: Self-pay | Admitting: Gastroenterology

## 2013-04-20 ENCOUNTER — Ambulatory Visit: Payer: Self-pay | Admitting: Family Medicine

## 2013-04-21 IMAGING — CT CT STONE STUDY
1 of 2 series · 15 of 32 positions shown, 19 images · non-contrast
Comparison: none

REASON FOR EXAM: back pain   hematuria
COMMENTS:

[Series 2: abdpel_xl 3.0 i40s 3 · axial · 0.98mm/px · z∈[-1038,-606]mm · 15 of 158 slices shown, 19 images]
[im 7/158  soft-tissue]
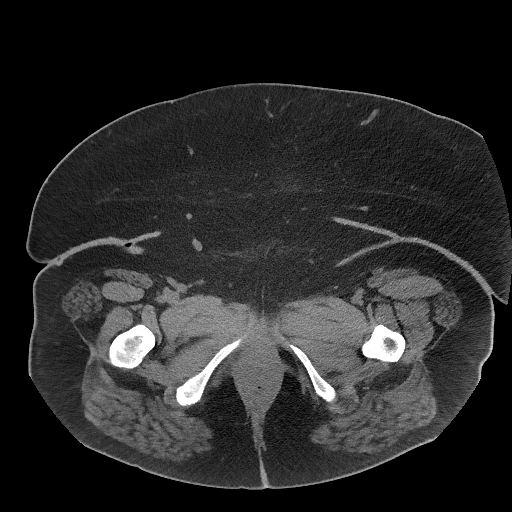
[im 7/158  bone]
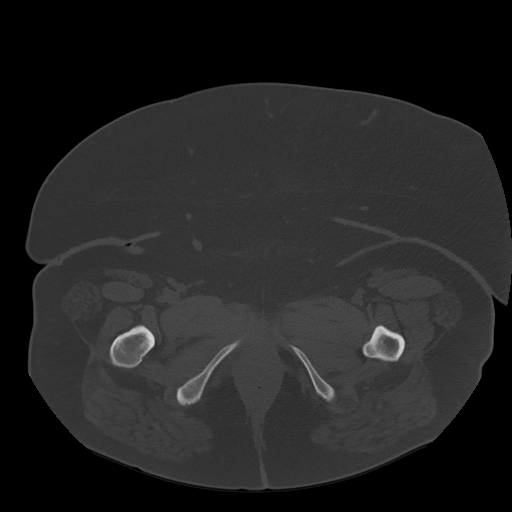
[im 19/158  soft-tissue]
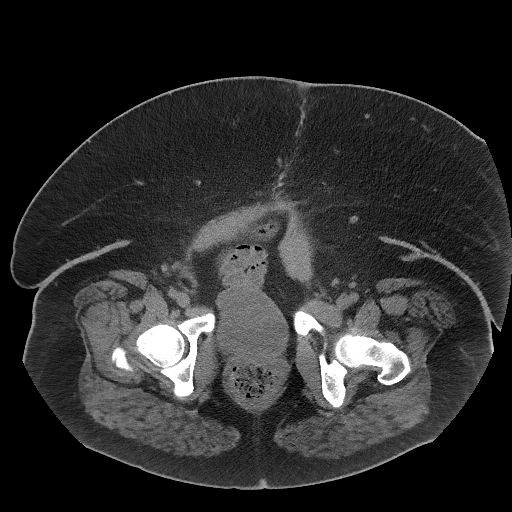
[im 31/158  soft-tissue]
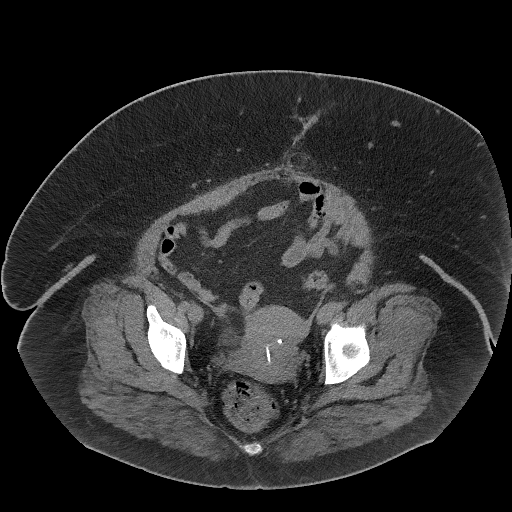
[im 43/158  soft-tissue]
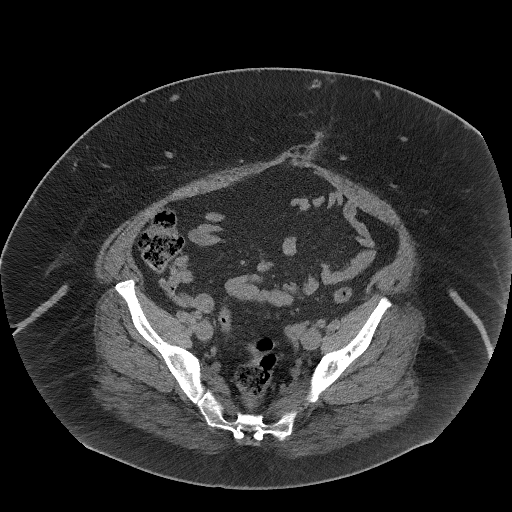
[im 55/158  soft-tissue]
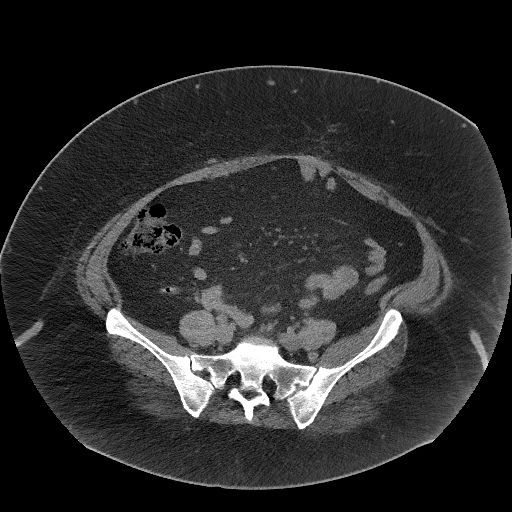
[im 67/158  soft-tissue]
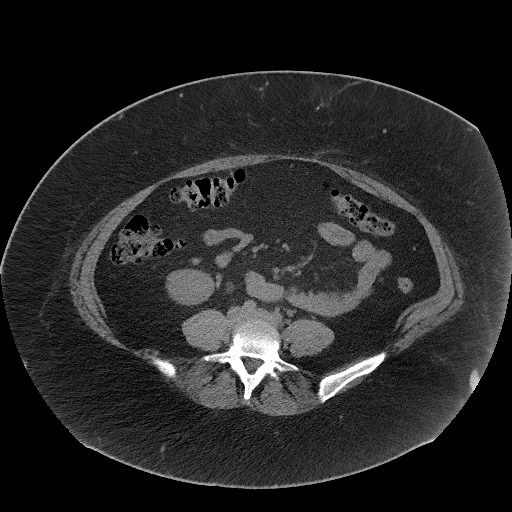
[im 79/158  soft-tissue]
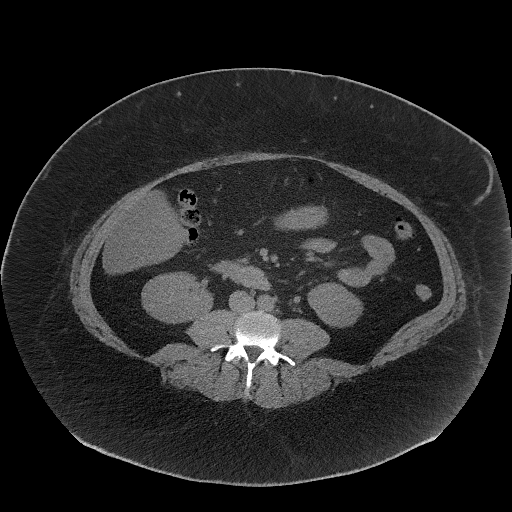
[im 91/158  soft-tissue]
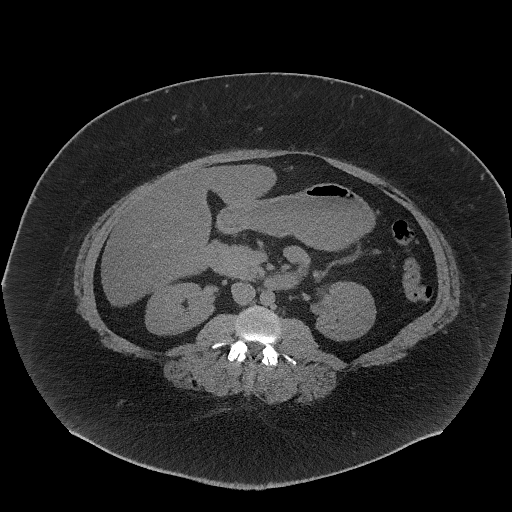
[im 103/158  soft-tissue]
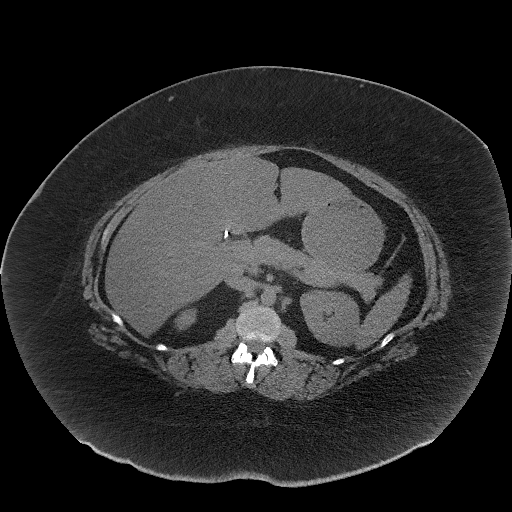
[im 103/158  bone]
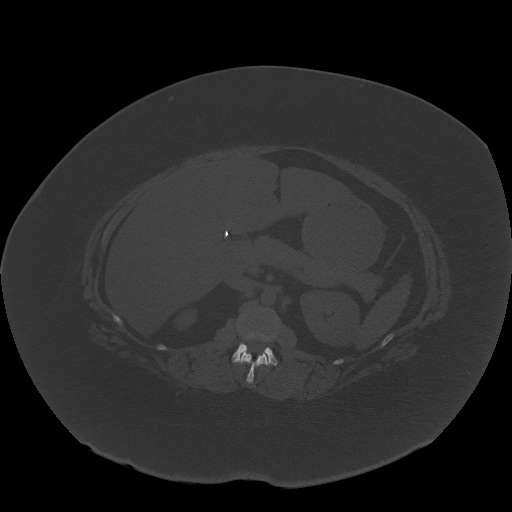
[im 115/158  soft-tissue]
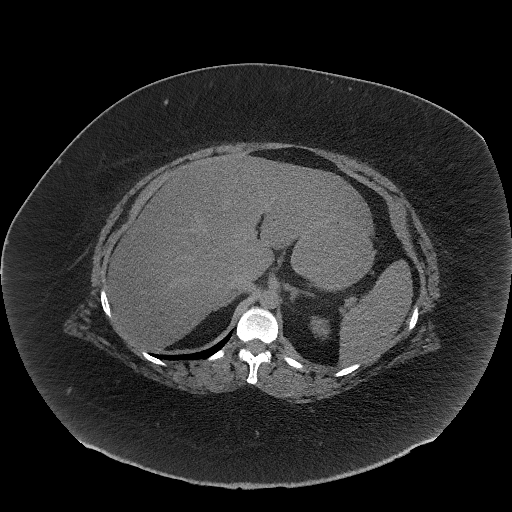
[im 127/158  soft-tissue]
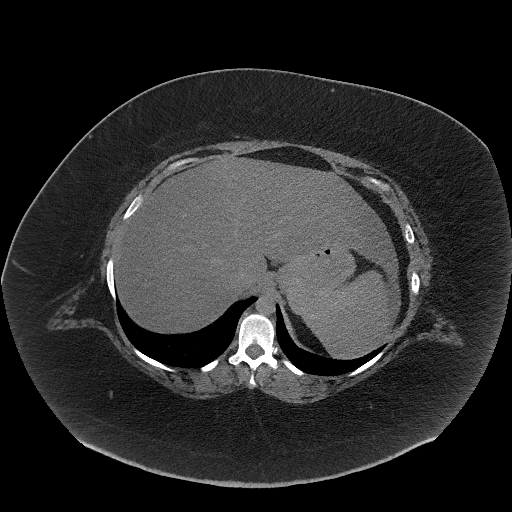
[im 133/158  lung]
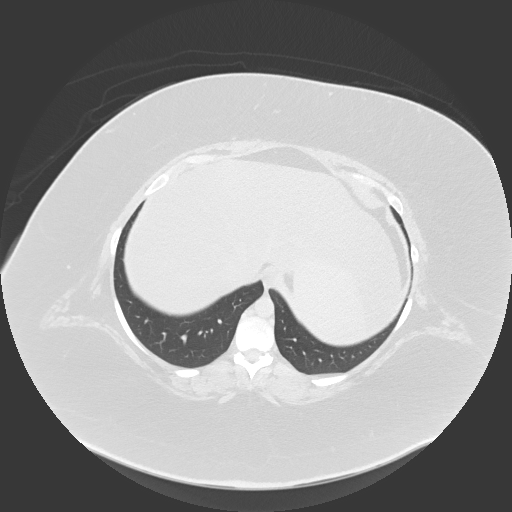
[im 139/158  soft-tissue]
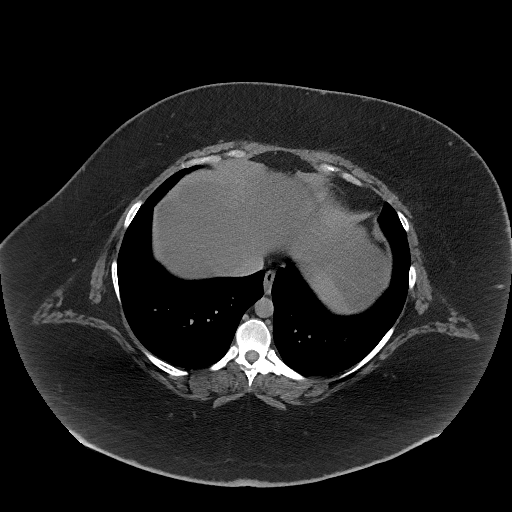
[im 139/158  lung]
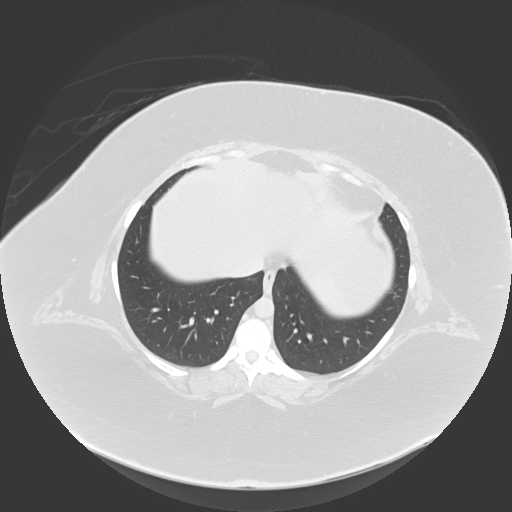
[im 145/158  lung]
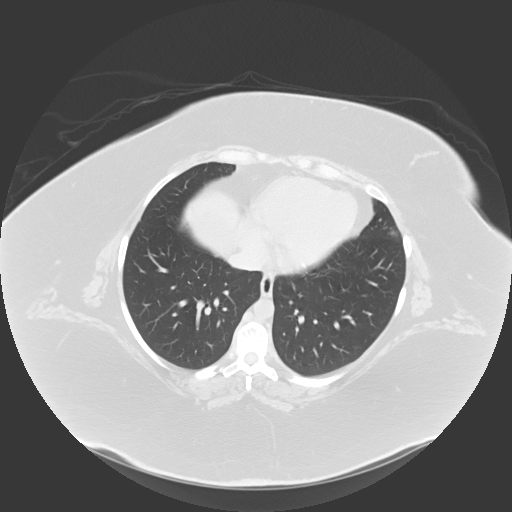
[im 151/158  soft-tissue]
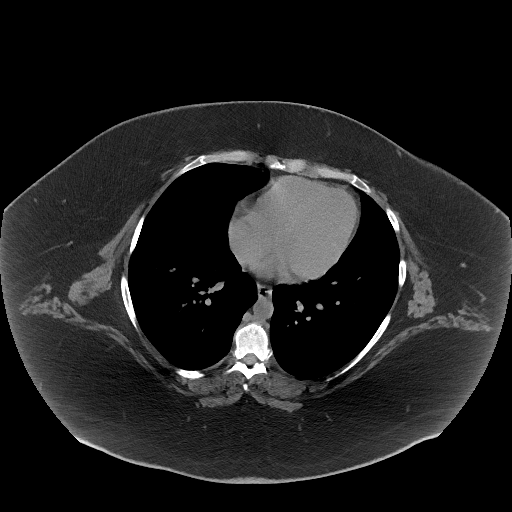
[im 151/158  lung]
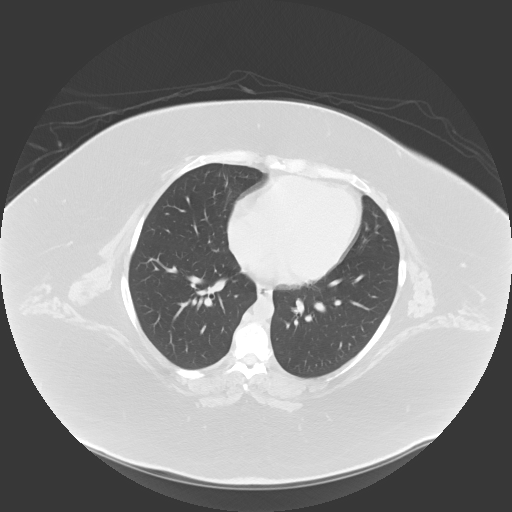

[15 of 32 positions shown; findings below may reference images not displayed]

PROCEDURE:     KCT - KCT ABDOMEN/PELVIS WO (STONE)  - [DATE] [DATE]

RESULT:     Noncontrast CT of the abdomen and pelvis is reconstructed at
mm slice thickness in the axial plane comparison made to previous
contrast-enhanced exam from [DATE].

Images through the base the lungs demonstrate grossly normal aeration. The
cholecystectomy clips are present. The liver, spleen, pancreas, adrenal
glands and abdominal aorta appear normal for noncontrast exam. There is an
intrauterine device present. There is a small amount of urine in the urinary
bladder. A normal appearing appendix is demonstrated. There is no abnormal
bowel distention or wall thickening area stomach is unremarkable. The
pancreas shows no mass or calcification. The kidneys show no definite stone
or obstructive change. No discrete mass is evident. Bony structures appear
unremarkable.
IMPRESSION: No nephrolithiasis. No intrinsic renal mass evident
although the study is noncontrast which is not optimal for violation of
solid abdominal viscera. There is no hydronephrosis or evidence of bladder
calculi. Cholecystectomy clips are present. Intrauterine device is present.
The lung bases are clear.

[REDACTED]

## 2013-04-23 LAB — STOOL CULTURE

## 2013-08-09 ENCOUNTER — Ambulatory Visit: Payer: Self-pay | Admitting: Urology

## 2013-08-09 IMAGING — CT CT ABDOMEN AND PELVIS WITHOUT AND WITH CONTRAST
2 of 4 series · 14 of 32 positions shown, 19 images · non-contrast
Comparison: none

REASON FOR EXAM: Hematuria
COMMENTS:

PROCEDURE:     CT  - CT ABDOMEN / PELVIS  W/WO  - [DATE] [DATE]
RESULT:
TECHNIQUE: Helical 3 mm sections were obtained from the lung bases through
the pubic symphysis pre-, immediate and delayed intravenous administration
of 100 mL of [SW].

[Series 4: 3mm soft tissue with · axial · 0.88mm/px · z∈[-1026,-654]mm · 8 of 160 slices shown, 13 images]
[im 18/160  soft-tissue]
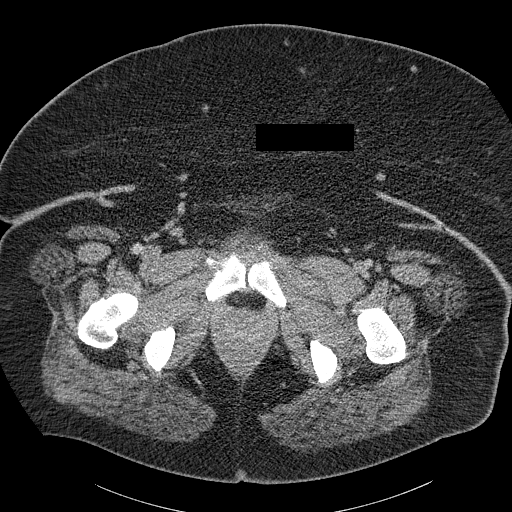
[im 18/160  bone]
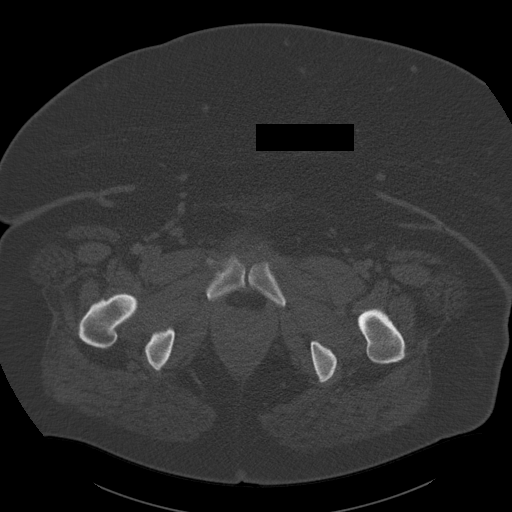
[im 36/160  soft-tissue]
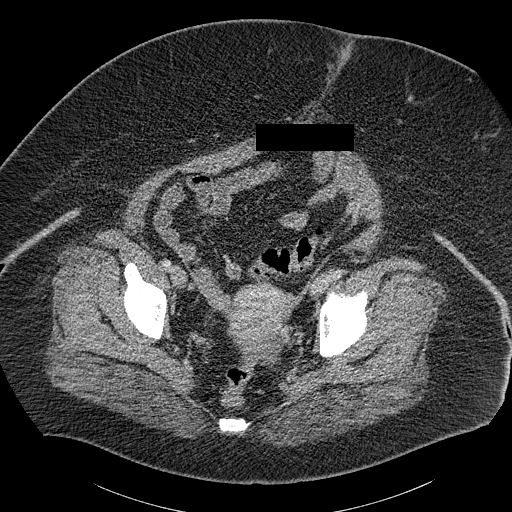
[im 54/160  soft-tissue]
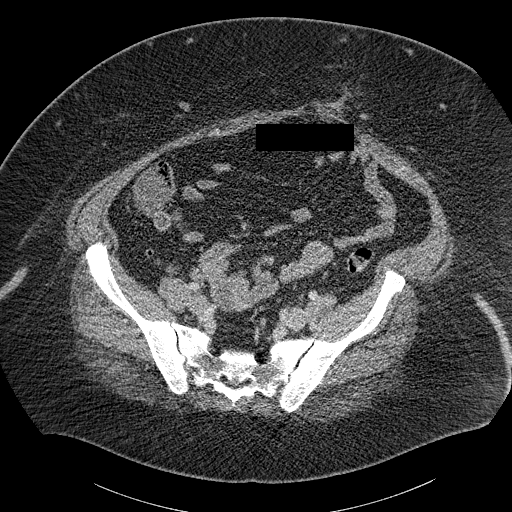
[im 71/160  soft-tissue]
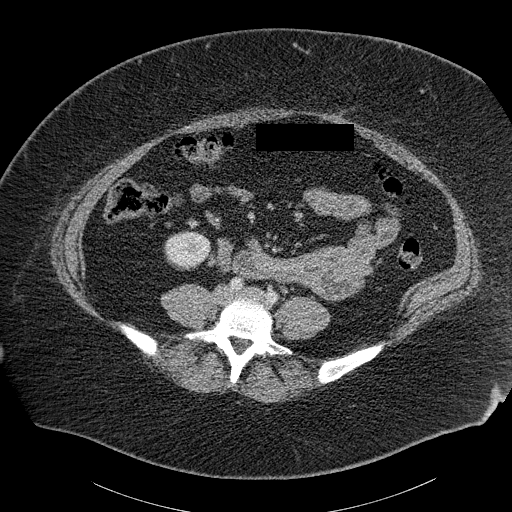
[im 89/160  soft-tissue]
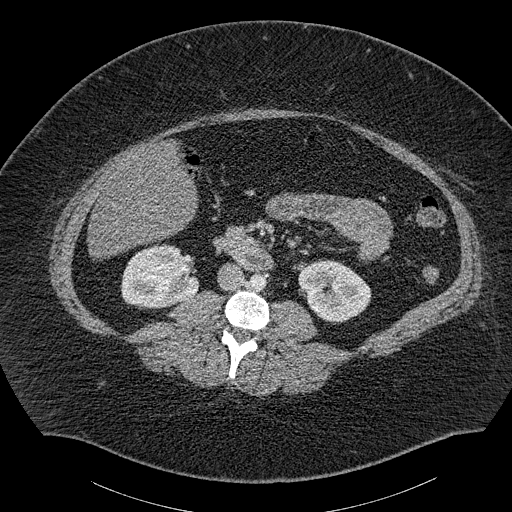
[im 89/160  lung]
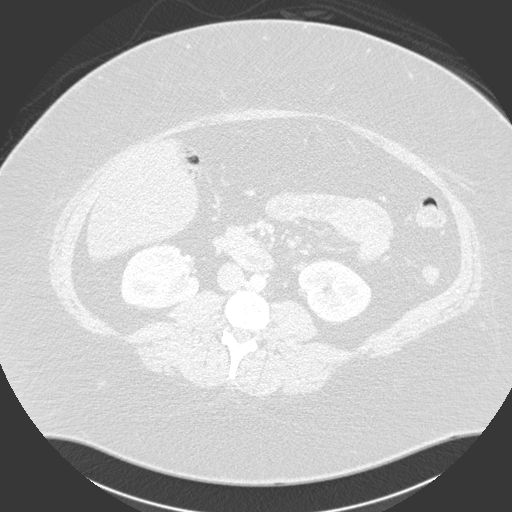
[im 107/160  soft-tissue]
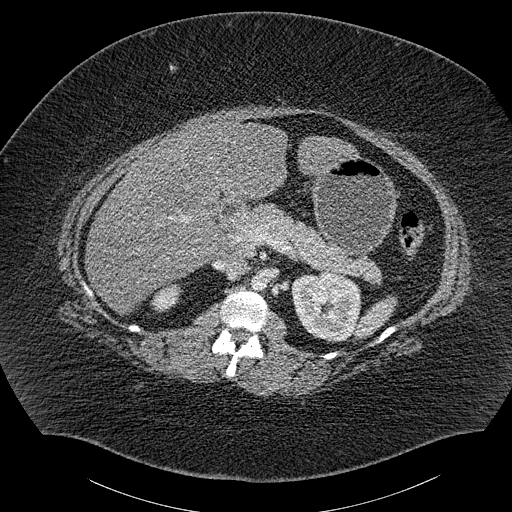
[im 107/160  lung]
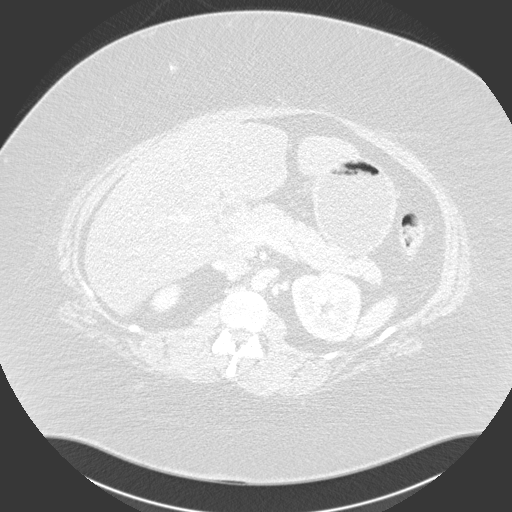
[im 124/160  soft-tissue]
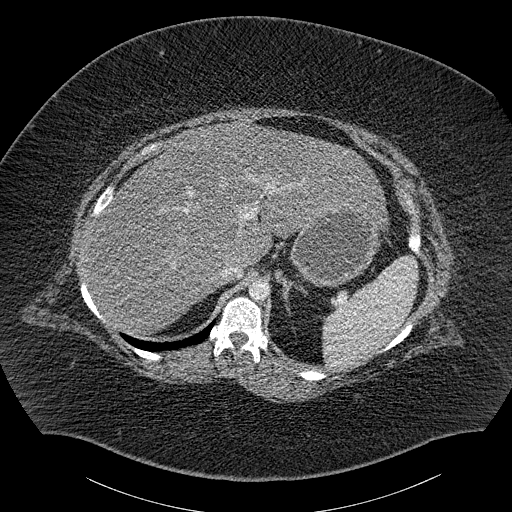
[im 124/160  lung]
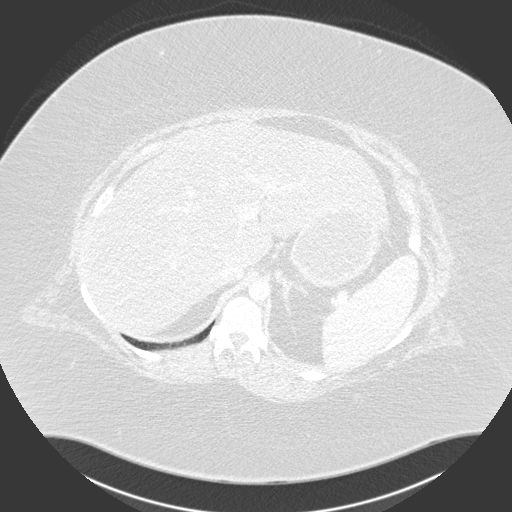
[im 142/160  soft-tissue]
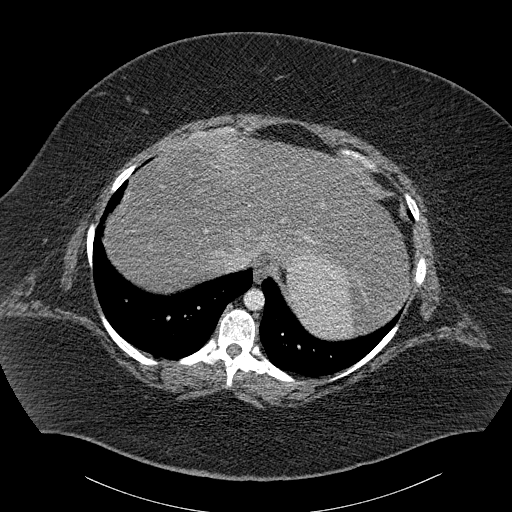
[im 142/160  lung]
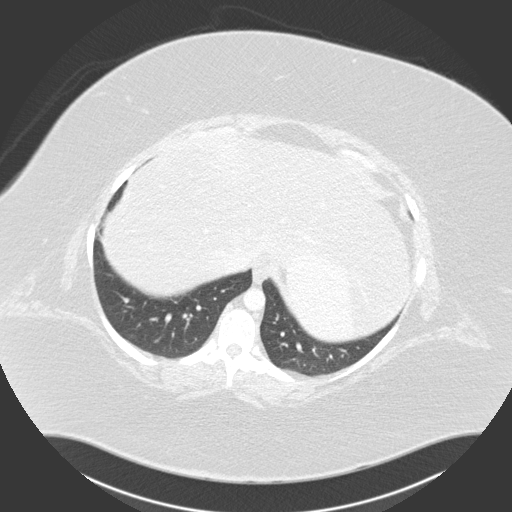

[Series 6: delay soft tissue · axial · delayed · 0.88mm/px · z∈[-1026,-758]mm · 6 of 160 slices shown]
[im 18/160  soft-tissue]
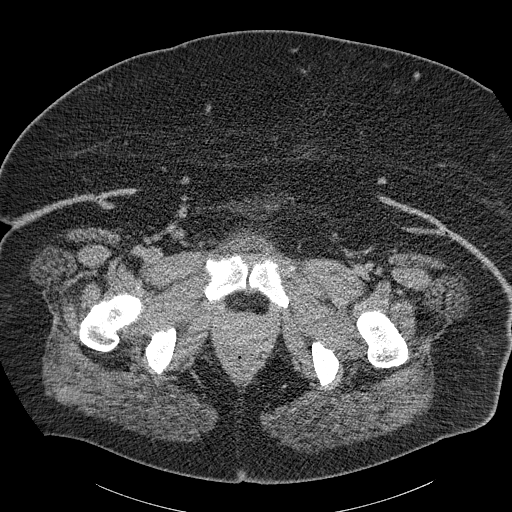
[im 36/160  soft-tissue]
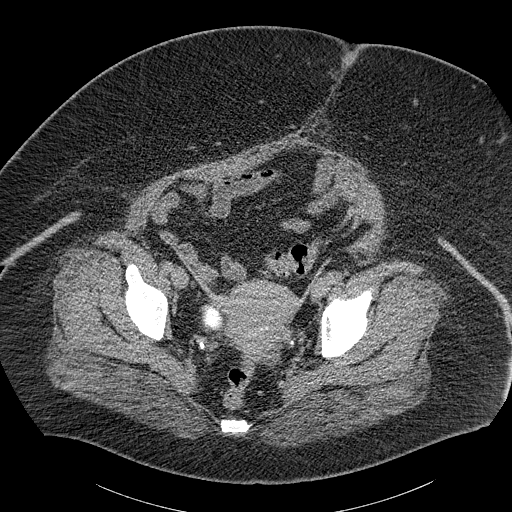
[im 54/160  soft-tissue]
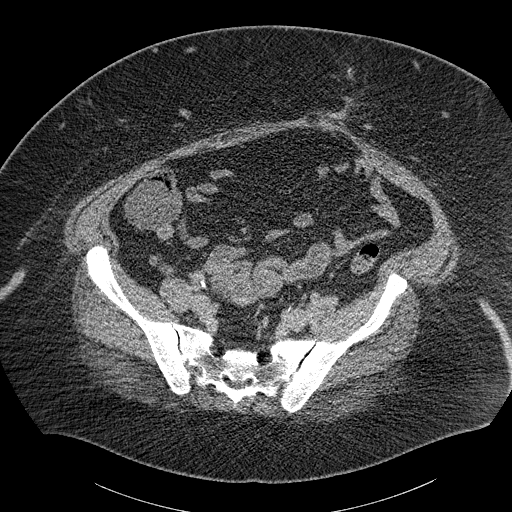
[im 71/160  soft-tissue]
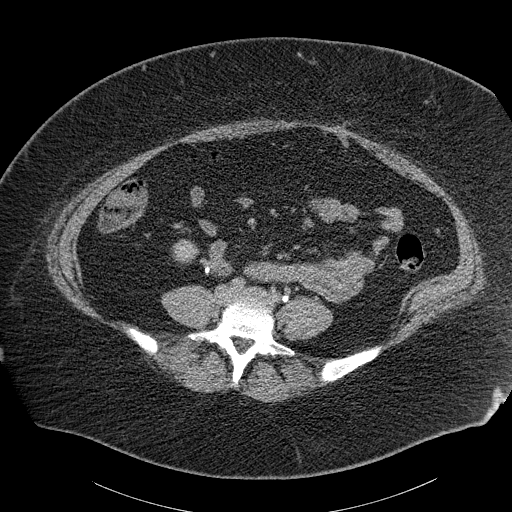
[im 89/160  soft-tissue]
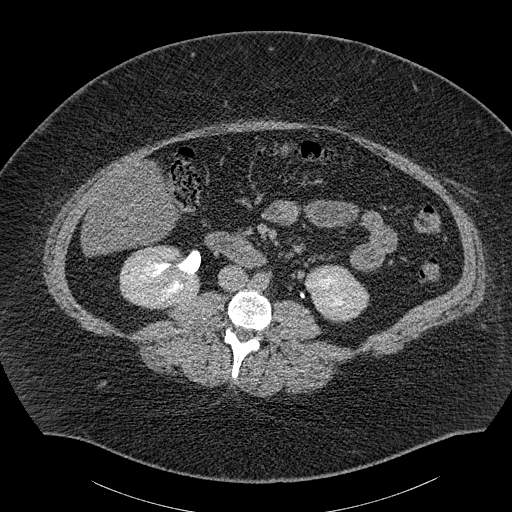
[im 107/160  soft-tissue]
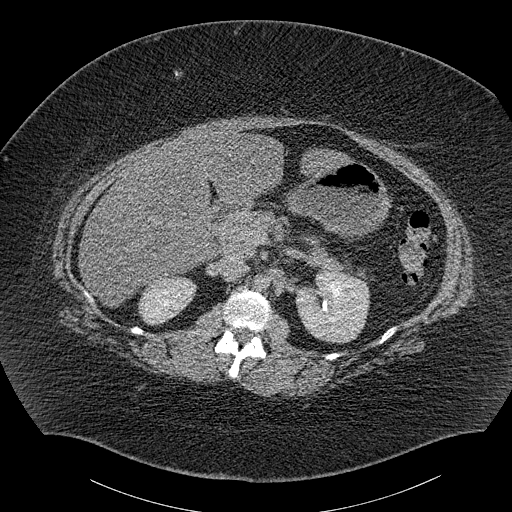

[14 of 32 positions shown; findings below may reference images not displayed]

FINDINGS: The lung bases demonstrate mild hypoventilation.

There is no evidence of nephrolithiasis nor ureterolithiasis. There is no
evidence of hydronephrosis, no evidence of solid or cystic masses. The liver
demonstrates a diffuse low attenuating architecture. The patient is status
post cholecystectomy. The spleen, adrenals, and pancreas are unremarkable.
There is no evidence of bowel obstruction, enteritis, colitis or
diverticulitis. An intrauterine device is appreciated.
IMPRESSION: 1. Hepatic steatosis, otherwise no evidence of obstructive or inflammatory
abnormalities nor evidence of focal or acute abnormalities.

## 2014-02-28 ENCOUNTER — Ambulatory Visit: Payer: Self-pay | Admitting: Physician Assistant

## 2014-07-07 DIAGNOSIS — O34219 Maternal care for unspecified type scar from previous cesarean delivery: Secondary | ICD-10-CM | POA: Insufficient documentation

## 2014-07-07 DIAGNOSIS — Z9884 Bariatric surgery status: Secondary | ICD-10-CM | POA: Insufficient documentation

## 2014-08-04 DIAGNOSIS — I517 Cardiomegaly: Secondary | ICD-10-CM | POA: Insufficient documentation

## 2014-08-04 DIAGNOSIS — O99210 Obesity complicating pregnancy, unspecified trimester: Secondary | ICD-10-CM | POA: Insufficient documentation

## 2014-09-23 DIAGNOSIS — O24419 Gestational diabetes mellitus in pregnancy, unspecified control: Secondary | ICD-10-CM | POA: Insufficient documentation

## 2014-09-23 DIAGNOSIS — O10913 Unspecified pre-existing hypertension complicating pregnancy, third trimester: Secondary | ICD-10-CM | POA: Insufficient documentation

## 2014-09-23 HISTORY — DX: Unspecified pre-existing hypertension complicating pregnancy, third trimester: O10.913

## 2014-09-23 HISTORY — DX: Gestational diabetes mellitus in pregnancy, unspecified control: O24.419

## 2014-12-10 ENCOUNTER — Observation Stay: Payer: Self-pay

## 2014-12-10 LAB — PIH PROFILE
ANION GAP: 11 (ref 7–16)
BUN: 11 mg/dL (ref 7–18)
CREATININE: 0.54 mg/dL — AB (ref 0.60–1.30)
Calcium, Total: 9.8 mg/dL (ref 8.5–10.1)
Chloride: 108 mmol/L — ABNORMAL HIGH (ref 98–107)
Co2: 21 mmol/L (ref 21–32)
GLUCOSE: 83 mg/dL (ref 65–99)
HCT: 36.9 % (ref 35.0–47.0)
HGB: 12.2 g/dL (ref 12.0–16.0)
MCH: 27.9 pg (ref 26.0–34.0)
MCHC: 33.1 g/dL (ref 32.0–36.0)
MCV: 84 fL (ref 80–100)
Osmolality: 278 (ref 275–301)
Platelet: 173 10*3/uL (ref 150–440)
Potassium: 3.9 mmol/L (ref 3.5–5.1)
RBC: 4.39 10*6/uL (ref 3.80–5.20)
RDW: 17 % — ABNORMAL HIGH (ref 11.5–14.5)
SGOT(AST): 15 U/L (ref 15–37)
Sodium: 140 mmol/L (ref 136–145)
URIC ACID: 4.8 mg/dL (ref 2.6–6.0)
WBC: 9.7 10*3/uL (ref 3.6–11.0)

## 2014-12-10 LAB — PROTEIN / CREATININE RATIO, URINE
CREATININE, URINE: 204.4 mg/dL — AB (ref 30.0–125.0)
PROTEIN, RANDOM URINE: 107 mg/dL — AB (ref 0–12)
Protein/Creat. Ratio: 523 mg/gCREAT — ABNORMAL HIGH (ref 0–200)

## 2014-12-23 DIAGNOSIS — Z8759 Personal history of other complications of pregnancy, childbirth and the puerperium: Secondary | ICD-10-CM | POA: Insufficient documentation

## 2014-12-27 DIAGNOSIS — K56609 Unspecified intestinal obstruction, unspecified as to partial versus complete obstruction: Secondary | ICD-10-CM | POA: Insufficient documentation

## 2015-01-16 ENCOUNTER — Ambulatory Visit: Payer: Self-pay | Admitting: Emergency Medicine

## 2015-02-27 DIAGNOSIS — E669 Obesity, unspecified: Secondary | ICD-10-CM

## 2015-02-27 DIAGNOSIS — E1169 Type 2 diabetes mellitus with other specified complication: Secondary | ICD-10-CM | POA: Insufficient documentation

## 2015-03-28 NOTE — H&P (Signed)
L&D Evaluation:  History Expanded:  HPI 29 yo G2P0101 at 5831w2d gestational age by 9 week ultrasound.  Patien'ts pregnancy complicated by chronic hypetension with recently diagnosed superimposed preeclampsia without severe features (at [facility] this past week). She is morbidly obese with a BMI of 64.  She has had a 20 pound weight gain this pregnancy, but the bulk of it has been in the past week or so.  She also has left ventricular hypertrophy but a normal left ventricular ejection fraction.  She has gestational diabetes on glyburide 2.5mg  QHS.  She has sleep apnea. During G1 she developed severe pre-eclampsia at 27 weeks requiring a classical cesarean delivery at that time.  She went into renal failure 3 days postpartum, per her report.  [facility] diagnosed with superimposed preeclampsia without severe features. She had her glyburide dose adjusted from 200mg  bid to 200mg  tid. She presents due to worsening BPs at home that have gradually worsened during the day today.  They were 146/80 yesterday and 148/90 this morning and slowly rising.  She notes headaches when her BPs are elevated.  She takes tylenol and this relieves her headache for about two hours.  She otherwise denies visual changes, and RUQ pain.  She has developed edema and blames this for her most recent weight gain.  She notes positive fetal movement from her female fetus Cloyde Reams(Reagan Faith), notes only occasional contractions, no leakage of fluid, and no vaginal bleeding.  She has no abdominal pain.   Group B Strep Results Maternal (Result >5wks must be treated as unknown) unknown/result > 5 weeks ago   Maternal HIV Unknown   Maternal Syphilis Ab Unknown   Maternal Varicella Unknown   Rubella Results (Maternal) unknown   Maternal T-Dap Unknown   [facility]   Patient's Medical History 1) asthma, 2) Chronic hypertension (now superimposed preeclampsia without severe features), 3) Gestational  diabetes (A2) with current pregnancy, 4) history of left ventricular hypertrophy with normal ejection fraction, 5) morbid obesity with BMI 63, Reynaud's phenomenon of the breast, 7) sleep apnea   Patient's Surgical History CLASSICAL cesarean delivery at 27 weeks with G1   Medications 1) PNV, 2) ASA 81mg  po daily, 3) labetalol 200mg  po tid, 4) nifedipine XL 30mg  po qdaily, 5) glyburide 2.5mg  po at dinner   Allergies robitussin   Family History Non-Contributory   ROS:  ROS All systems were reviewed.  HEENT, CNS, GI, GU, Respiratory, CV, Renal and Musculoskeletal systems were found to be normal., unless otherise noted in HPI   Exam:  Vital Signs BP 133/73, 173/99 (lying on cuff), immediate recheck 148/81   General no apparent distress, morbidly obese   Mental Status clear   Chest restricted breath sounds with occasional expiratory wheezing   Heart normal sinus rhythm   Abdomen obese, gravid, nontender, +BS   Estimated Fetal Weight could not estimate by leopolds given body habitus   Back no CVAT   Edema 1+   Reflexes 2+   Mebranes Intact   FHT baseline 130/mod var/+accels/no decels   Ucx irregular, infrequent   Skin no lesions   Impression:  Impression 1) Intrauterine pregnancy 8631w2d gestational age, 2) history of CLASSICAL cesarean delivery, 3) morbid obesity with bmi 63, 4) chronic hypertension with superimposed preeclampsia with question of severe features, 5) Gestational diabetes A2, 6) left ventricular hypertrophy with recently a normal EF   Plan:  Comments 1) superimposed preeclampsia: will cycle blood pressures and if is having  severe-range pressures or if any of her HELLP or pre-eclampsia labs are indicative of severe features [facility]  She currently has a headache that is somewhat controlled with Tylenol.  Labs, as previously noted. 2) Fetal well being: reassuring overall.  Per patient, recent growth ultrasound performed 5 days ago showed fetus in  39th %ile.   3) dispo: very low threshold to [facility]  She would be ok with this plan.  [facility] she was staying with some [facility] for fear of a power outage at home.    of note: we have no records of this patient available at this time.  [facility] who gave me most of the information that I have. The patient is carrying a notebook with her and is able to read of weight and blood glucose values, etc.  So, she seems fairly reliable.   Electronic Signatures: Conard NovakJackson, Meital Riehl D (MD)  (Signed 23-Jan-16 17:48)  Authored: L&D Evaluation   Last Updated: 23-Jan-16 17:48 by Conard NovakJackson, Kacee Sukhu D (MD)

## 2016-02-26 ENCOUNTER — Encounter: Payer: Self-pay | Admitting: Family Medicine

## 2016-02-26 ENCOUNTER — Ambulatory Visit (INDEPENDENT_AMBULATORY_CARE_PROVIDER_SITE_OTHER): Payer: 59 | Admitting: Family Medicine

## 2016-02-26 VITALS — BP 124/84 | HR 101 | Temp 98.2°F | Resp 16 | Ht 63.0 in | Wt 373.0 lb

## 2016-02-26 DIAGNOSIS — B379 Candidiasis, unspecified: Secondary | ICD-10-CM

## 2016-02-26 DIAGNOSIS — Z9989 Dependence on other enabling machines and devices: Secondary | ICD-10-CM

## 2016-02-26 DIAGNOSIS — R350 Frequency of micturition: Secondary | ICD-10-CM

## 2016-02-26 DIAGNOSIS — K921 Melena: Secondary | ICD-10-CM

## 2016-02-26 DIAGNOSIS — R7303 Prediabetes: Secondary | ICD-10-CM

## 2016-02-26 DIAGNOSIS — G4733 Obstructive sleep apnea (adult) (pediatric): Secondary | ICD-10-CM | POA: Diagnosis not present

## 2016-02-26 DIAGNOSIS — R635 Abnormal weight gain: Secondary | ICD-10-CM

## 2016-02-26 DIAGNOSIS — I10 Essential (primary) hypertension: Secondary | ICD-10-CM

## 2016-02-26 DIAGNOSIS — G43009 Migraine without aura, not intractable, without status migrainosus: Secondary | ICD-10-CM

## 2016-02-26 DIAGNOSIS — R300 Dysuria: Secondary | ICD-10-CM | POA: Insufficient documentation

## 2016-02-26 DIAGNOSIS — Z5181 Encounter for therapeutic drug level monitoring: Secondary | ICD-10-CM

## 2016-02-26 DIAGNOSIS — R7301 Impaired fasting glucose: Secondary | ICD-10-CM

## 2016-02-26 DIAGNOSIS — G43909 Migraine, unspecified, not intractable, without status migrainosus: Secondary | ICD-10-CM | POA: Insufficient documentation

## 2016-02-26 HISTORY — DX: Essential (primary) hypertension: I10

## 2016-02-26 MED ORDER — NYSTATIN 100000 UNIT/GM EX CREA: 1.0000 "application " | TOPICAL_CREAM | Freq: Two times a day (BID) | CUTANEOUS | Status: DC

## 2016-02-26 MED ORDER — LISINOPRIL-HYDROCHLOROTHIAZIDE 10-12.5 MG PO TABS: 1.0000 | ORAL_TABLET | Freq: Every day | ORAL | Status: DC

## 2016-02-26 MED ORDER — ELETRIPTAN HYDROBROMIDE 20 MG PO TABS: 20.0000 mg | ORAL_TABLET | ORAL | Status: DC | PRN

## 2016-02-26 MED ORDER — ALBUTEROL SULFATE HFA 108 (90 BASE) MCG/ACT IN AERS: 2.0000 | INHALATION_SPRAY | RESPIRATORY_TRACT | Status: DC | PRN

## 2016-02-26 NOTE — Progress Notes (Signed)
BP 124/84 mmHg  Pulse 101  Temp(Src) 98.2 F (36.8 C) (Oral)  Resp 16  Ht  (1.6 m)  Wt 373 lb (169.192 kg)  BMI 66.09 kg/m2  SpO2 96%  LMP 02/02/2016 (Approximate)   Subjective:    Patient ID: Tanya Reeves, female    DOB: 29-Nov-1985, 30 y.o.   MRN: [id number]  HPI: Tanya Reeves is a 30 y.o. female  Chief Complaint  Patient presents with  . Establish Care  . Hypertension  . Migraine    well controlled with Replax  . Rash    onset 1 week under right arm. Symptoms include reddness and itching  . Urinary Tract Infection    onset 3 days, some lower pelvic pain, frequency and burning  . Sleep Apnea    sleeps with cpap machine  . Asthma    sometimes flares with allergies   Patient is new to me and this practice; I care for her husband  She thinks she has a bladder infection; started with lower abd pain 3 days ago; burning with urination; mostly frequency with urination; no strong odor; no blood in the urine; no fevers; no prior procedures of the urinary tract; no hx of stones  Rash under the right armpit; irritated and red; nothing really similar; can't see it well; started with nystatiin and monistat and then desitin; thinks yeast or skin breakdown; no diabetes  She gained weight since she had her baby; had 3 yeast infections (vaginal) this year; feels bad if she doesn't eat after a few hours; both parents with diabetes; some dry mouth  Sleep apnea; wears CPAP; diagnosed during pregnancy; sleeps well with it; works so well, routine  Asthma; worsened by allergies; uses inhaler maybe six times a year; allergies act up from time to time  HTN; diagnosed during pregnancy; maintained after pregnancy; part of bowel had been covered with scar tissue with 2nd baby C-section, five hour procedure; very traumatic time; the bowel had necrosed with scar tissue, not sure which came first; went back 3 days after having the baby with a SBO; she alternates with loose stools, s/p  choly; might have back-up of bowel; there is a narrow area at one point of bowel; occasional red in the stool; some burning in the stomach at times after glass of wine or something spicy; just happened in the last year; she does not want to see a GI doctor; no bleeding for a while; no fam hx of colon cancer  Migraines since age 4; NO aura; the relpax helps and it's amazing; works really well; used to have 3-4 per months; now just once every six months  Relevant past medical, surgical, family and social history reviewed and updated as indicated Allergies and medications reviewed and updated.  Review of Systems Per HPI unless specifically indicated above     Objective:    BP 124/84 mmHg  Pulse 101  Temp(Src) 98.2 F (36.8 C) (Oral)  Resp 16  Ht  (1.6 m)  Wt 373 lb (169.192 kg)  BMI 66.09 kg/m2  SpO2 96%  LMP 02/02/2016 (Approximate)  Wt Readings from Last 3 Encounters:  02/26/16 373 lb (169.192 kg)    Physical Exam  Constitutional: She appears well-developed and well-nourished. No distress.  HENT:  Head: Normocephalic and atraumatic.  Eyes: EOM are normal. No scleral icterus.  Neck: No thyromegaly present.  Cardiovascular: Normal rate, regular rhythm and normal heart sounds.   No murmur heard. Rate under 100 during  auscultation  Pulmonary/Chest: Effort normal and breath sounds normal. No respiratory distress. She has no wheezes.  Abdominal: Soft. Bowel sounds are normal. She exhibits no distension.  Very large pannus; could not palpate bladder region well  Musculoskeletal: Normal range of motion. She exhibits no edema.  Neurological: She is alert. She exhibits normal muscle tone.  Skin: Skin is warm and dry. Rash (consistent with mild candidiasis, mildly erythematous, in intriginous area) noted. She is not diaphoretic. No pallor.  Psychiatric: She has a normal mood and affect. Her behavior is normal. Judgment and thought content normal.  Very pleasant      Assessment  & Plan:   Problem List Items Addressed This Visit      Cardiovascular and Mediastinum   Essential hypertension, benign    Controlled today; weight loss would help. Try DASH guidelines; continue meds      Relevant Medications   lisinopril-hydrochlorothiazide (PRINZIDE,ZESTORETIC) 10-12.5 MG tablet   Migraine without aura    BP controlled; continue to avoid triggers when possible, relpax prn      Relevant Medications   eletriptan (RELPAX) 20 MG tablet   lisinopril-hydrochlorothiazide (PRINZIDE,ZESTORETIC) 10-12.5 MG tablet     Respiratory   OSA on CPAP    OSA likely related to her morbid obesity; continue CPAP        Endocrine   IFG (impaired fasting glucose)    Monitor glucose and a1c; morbid obesity increasing risk of insulin resistance, may lead to outright diabetes        Other   Urinary frequency - Primary    Check urine today      Relevant Orders   UA/M w/rflx Culture, Routine (Completed)   Dysuria   Relevant Orders   UA/M w/rflx Culture, Routine (Completed)   Candidiasis    Topical agent; morbid obesity likely exacerbating this as well      Relevant Medications   nystatin cream (MYCOSTATIN)   Medication monitoring encounter   Relevant Orders   Comprehensive metabolic panel (Completed)   Morbid obesity (HCC)    Will check TSH; I am here to help patient if she decides to consider bariatry surgery, medication mangaement for her obesity       Other Visit Diagnoses    Borderline diabetes mellitus        Relevant Orders    Lipid Panel w/o Chol/HDL Ratio (Completed)    Hgb A1c w/o eAG (Completed)    Weight gain        Relevant Orders    TSH (Completed)    Blood in stool        she does not want to see GI; encouraged her to be vigilant and let me know if recurs    Relevant Orders    CBC with Differential/Platelet (Completed)       Follow up plan: Return in about 6 months (around 08/27/2016) for fasting labs and visit.  An after-visit summary was  printed and given to the patient at check-out.  Please see the patient instructions which may contain other information and recommendations beyond what is mentioned above in the assessment and plan.  Orders Placed This Encounter  Procedures  . Microscopic Examination  . Lipid Panel w/o Chol/HDL Ratio  . CBC with Differential/Platelet  . Comprehensive metabolic panel  . Hgb A1c w/o eAG  . TSH  . UA/M w/rflx Culture, Routine    Face-to-face time with patient was more than 45 minutes, >50% time spent counseling and coordination of care

## 2016-02-26 NOTE — Patient Instructions (Signed)
We'll get labs today If you have not heard anything from my staff in a week about any orders/referrals/studies from today, please contact us here to follow-up [phone] Try the DASH guidelines for blood pressure control Do let me know right away of any recurrence of blood in your stool Okay to use H2 blocker like Zantac PRN and avoid triggers when possible

## 2016-02-27 LAB — COMPREHENSIVE METABOLIC PANEL
A/G RATIO: 1.4 (ref 1.2–2.2)
ALK PHOS: 53 IU/L (ref 39–117)
ALT: 59 IU/L — AB (ref 0–32)
AST: 37 IU/L (ref 0–40)
Albumin: 4.2 g/dL (ref 3.5–5.5)
BUN/Creatinine Ratio: 24 — ABNORMAL HIGH (ref 9–23)
BUN: 11 mg/dL (ref 6–20)
Bilirubin Total: 0.3 mg/dL (ref 0.0–1.2)
CALCIUM: 9.8 mg/dL (ref 8.7–10.2)
CHLORIDE: 101 mmol/L (ref 96–106)
CO2: 24 mmol/L (ref 18–29)
Creatinine, Ser: 0.45 mg/dL — ABNORMAL LOW (ref 0.57–1.00)
GFR calc Af Amer: 154 mL/min/{1.73_m2} (ref >59)
GFR, EST NON AFRICAN AMERICAN: 134 mL/min/{1.73_m2} (ref >59)
GLOBULIN, TOTAL: 3 g/dL (ref 1.5–4.5)
Glucose: 102 mg/dL — ABNORMAL HIGH (ref 65–99)
POTASSIUM: 5.2 mmol/L (ref 3.5–5.2)
SODIUM: 141 mmol/L (ref 134–144)
Total Protein: 7.2 g/dL (ref 6.0–8.5)

## 2016-02-27 LAB — CBC WITH DIFFERENTIAL/PLATELET
BASOS: 1 %
Basophils Absolute: 0 10*3/uL (ref 0.0–0.2)
EOS (ABSOLUTE): 0.3 10*3/uL (ref 0.0–0.4)
EOS: 4 %
Hematocrit: 38.8 % (ref 34.0–46.6)
Hemoglobin: 12.7 g/dL (ref 11.1–15.9)
Immature Grans (Abs): 0 10*3/uL (ref 0.0–0.1)
Immature Granulocytes: 0 %
LYMPHS ABS: 2.7 10*3/uL (ref 0.7–3.1)
Lymphs: 37 %
MCH: 27.5 pg (ref 26.6–33.0)
MCHC: 32.7 g/dL (ref 31.5–35.7)
MCV: 84 fL (ref 79–97)
MONOCYTES: 8 %
MONOS ABS: 0.6 10*3/uL (ref 0.1–0.9)
Neutrophils Absolute: 3.7 10*3/uL (ref 1.4–7.0)
Neutrophils: 50 %
PLATELETS: 261 10*3/uL (ref 150–379)
RBC: 4.62 x10E6/uL (ref 3.77–5.28)
RDW: 15.4 % (ref 12.3–15.4)
WBC: 7.3 10*3/uL (ref 3.4–10.8)

## 2016-02-27 LAB — UA/M W/RFLX CULTURE, ROUTINE
Bilirubin, UA: NEGATIVE
Glucose, UA: NEGATIVE
Ketones, UA: NEGATIVE
LEUKOCYTES UA: NEGATIVE
NITRITE UA: NEGATIVE
PH UA: 6 (ref 5.0–7.5)
RBC, UA: NEGATIVE
SPEC GRAV UA: 1.022 (ref 1.005–1.030)
Urobilinogen, Ur: 0.2 mg/dL (ref 0.2–1.0)

## 2016-02-27 LAB — MICROSCOPIC EXAMINATION: Casts: NONE SEEN /lpf

## 2016-02-27 LAB — LIPID PANEL W/O CHOL/HDL RATIO
Cholesterol, Total: 183 mg/dL (ref 100–199)
HDL: 44 mg/dL (ref >39)
LDL Calculated: 93 mg/dL (ref 0–99)
TRIGLYCERIDES: 229 mg/dL — AB (ref 0–149)
VLDL Cholesterol Cal: 46 mg/dL — ABNORMAL HIGH (ref 5–40)

## 2016-02-27 LAB — HGB A1C W/O EAG: HEMOGLOBIN A1C: 6.1 % — AB (ref 4.8–5.6)

## 2016-02-27 LAB — TSH: TSH: 2.1 u[IU]/mL (ref 0.450–4.500)

## 2016-03-01 ENCOUNTER — Telehealth: Payer: Self-pay

## 2016-03-01 DIAGNOSIS — R7301 Impaired fasting glucose: Secondary | ICD-10-CM

## 2016-03-01 NOTE — Telephone Encounter (Signed)
Please review labs. 

## 2016-03-01 NOTE — Telephone Encounter (Signed)
I called, but no answer, could not leave message, no voicemail

## 2016-03-01 NOTE — Telephone Encounter (Signed)
Discussed lab results by phone; I did not get to tell her about the A1c (did not scroll down far enough) so I called her back and we discussed all labs Work on weight loss, healthier eating, limit simple carbs, maybe try fish oil for TG; will want to recheck labs every 6 months She agrees

## 2016-03-17 ENCOUNTER — Encounter: Payer: Self-pay | Admitting: Family Medicine

## 2016-03-17 DIAGNOSIS — Z6841 Body Mass Index (BMI) 40.0 and over, adult: Secondary | ICD-10-CM | POA: Insufficient documentation

## 2016-03-17 HISTORY — DX: Morbid (severe) obesity due to excess calories: E66.01

## 2016-03-17 NOTE — Assessment & Plan Note (Signed)
Controlled today; weight loss would help. Try DASH guidelines; continue meds

## 2016-03-17 NOTE — Assessment & Plan Note (Signed)
Will check TSH; I am here to help patient if she decides to consider bariatry surgery, medication mangaement for her obesity

## 2016-03-17 NOTE — Assessment & Plan Note (Signed)
Check urine today 

## 2016-03-17 NOTE — Assessment & Plan Note (Signed)
BP controlled; continue to avoid triggers when possible, relpax prn

## 2016-03-17 NOTE — Assessment & Plan Note (Signed)
Topical agent; morbid obesity likely exacerbating this as well

## 2016-03-17 NOTE — Assessment & Plan Note (Signed)
Monitor glucose and a1c; morbid obesity increasing risk of insulin resistance, may lead to outright diabetes

## 2016-03-17 NOTE — Assessment & Plan Note (Signed)
OSA likely related to her morbid obesity; continue CPAP

## 2016-07-17 ENCOUNTER — Telehealth: Payer: Self-pay | Admitting: Family Medicine

## 2016-07-17 MED ORDER — FLUCONAZOLE 150 MG PO TABS: 150.0000 mg | ORAL_TABLET | Freq: Once | ORAL | 0 refills | Status: AC

## 2016-07-17 NOTE — Telephone Encounter (Signed)
Rx sent 

## 2016-07-31 ENCOUNTER — Ambulatory Visit
Admission: EM | Admit: 2016-07-31 | Discharge: 2016-07-31 | Disposition: A | Discharge status: Home / Self Care | Payer: 59 | Attending: Family Medicine | Admitting: Family Medicine

## 2016-07-31 ENCOUNTER — Ambulatory Visit (INDEPENDENT_AMBULATORY_CARE_PROVIDER_SITE_OTHER): Payer: 59

## 2016-07-31 DIAGNOSIS — J45901 Unspecified asthma with (acute) exacerbation: Secondary | ICD-10-CM | POA: Diagnosis not present

## 2016-07-31 DIAGNOSIS — J069 Acute upper respiratory infection, unspecified: Secondary | ICD-10-CM

## 2016-07-31 LAB — BASIC METABOLIC PANEL
ANION GAP: 8 (ref 5–15)
BUN: 19 mg/dL (ref 6–20)
CO2: 25 mmol/L (ref 22–32)
Calcium: 9.4 mg/dL (ref 8.9–10.3)
Chloride: 102 mmol/L (ref 101–111)
Creatinine, Ser: 0.58 mg/dL (ref 0.44–1.00)
GFR calc Af Amer: >60 mL/min (ref >60)
GFR calc non Af Amer: >60 mL/min (ref >60)
GLUCOSE: 96 mg/dL (ref 65–99)
POTASSIUM: 3.8 mmol/L (ref 3.5–5.1)
Sodium: 135 mmol/L (ref 135–145)

## 2016-07-31 LAB — CBC WITH DIFFERENTIAL/PLATELET
BASOS ABS: 0.1 10*3/uL (ref 0–0.1)
Basophils Relative: 1 %
Eosinophils Absolute: 0.3 10*3/uL (ref 0–0.7)
Eosinophils Relative: 3 %
HEMATOCRIT: 39.5 % (ref 35.0–47.0)
Hemoglobin: 13.1 g/dL (ref 12.0–16.0)
LYMPHS ABS: 3.6 10*3/uL (ref 1.0–3.6)
LYMPHS PCT: 34 %
MCH: 27.7 pg (ref 26.0–34.0)
MCHC: 33.3 g/dL (ref 32.0–36.0)
MCV: 83.2 fL (ref 80.0–100.0)
MONO ABS: 0.8 10*3/uL (ref 0.2–0.9)
Monocytes Relative: 7 %
NEUTROS ABS: 5.6 10*3/uL (ref 1.4–6.5)
Neutrophils Relative %: 55 %
Platelets: 253 10*3/uL (ref 150–440)
RBC: 4.75 MIL/uL (ref 3.80–5.20)
RDW: 14.9 % — ABNORMAL HIGH (ref 11.5–14.5)
WBC: 10.4 10*3/uL (ref 3.6–11.0)

## 2016-07-31 LAB — FIBRIN DERIVATIVES D-DIMER (ARMC ONLY): Fibrin derivatives D-dimer (ARMC): 220 (ref 0–499)

## 2016-07-31 IMAGING — CR DG CHEST 2V
2 series · 2 of 2 positions shown · non-contrast
Comparison: [DATE]

CLINICAL DATA: Wheezing, shortness of breath. Productive cough for
2-3 days.

EXAM:
CHEST  2 VIEW

[chest pa]
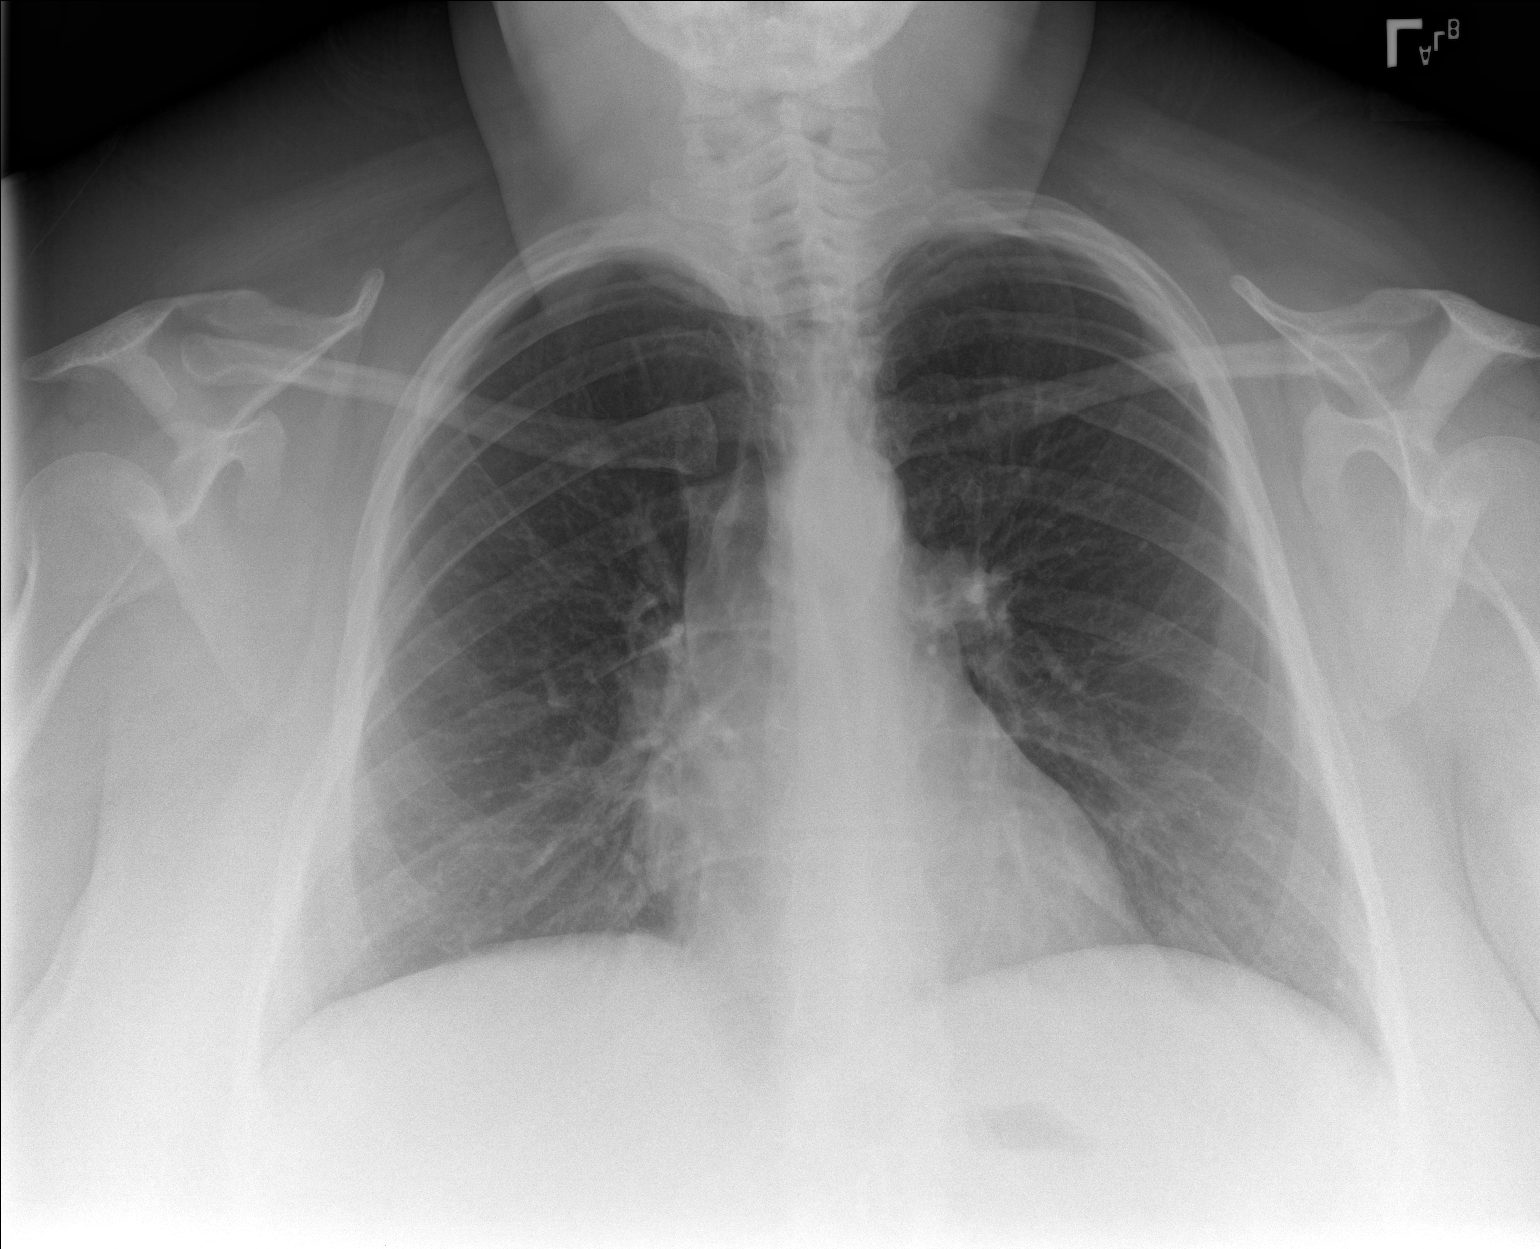

[chest lat]
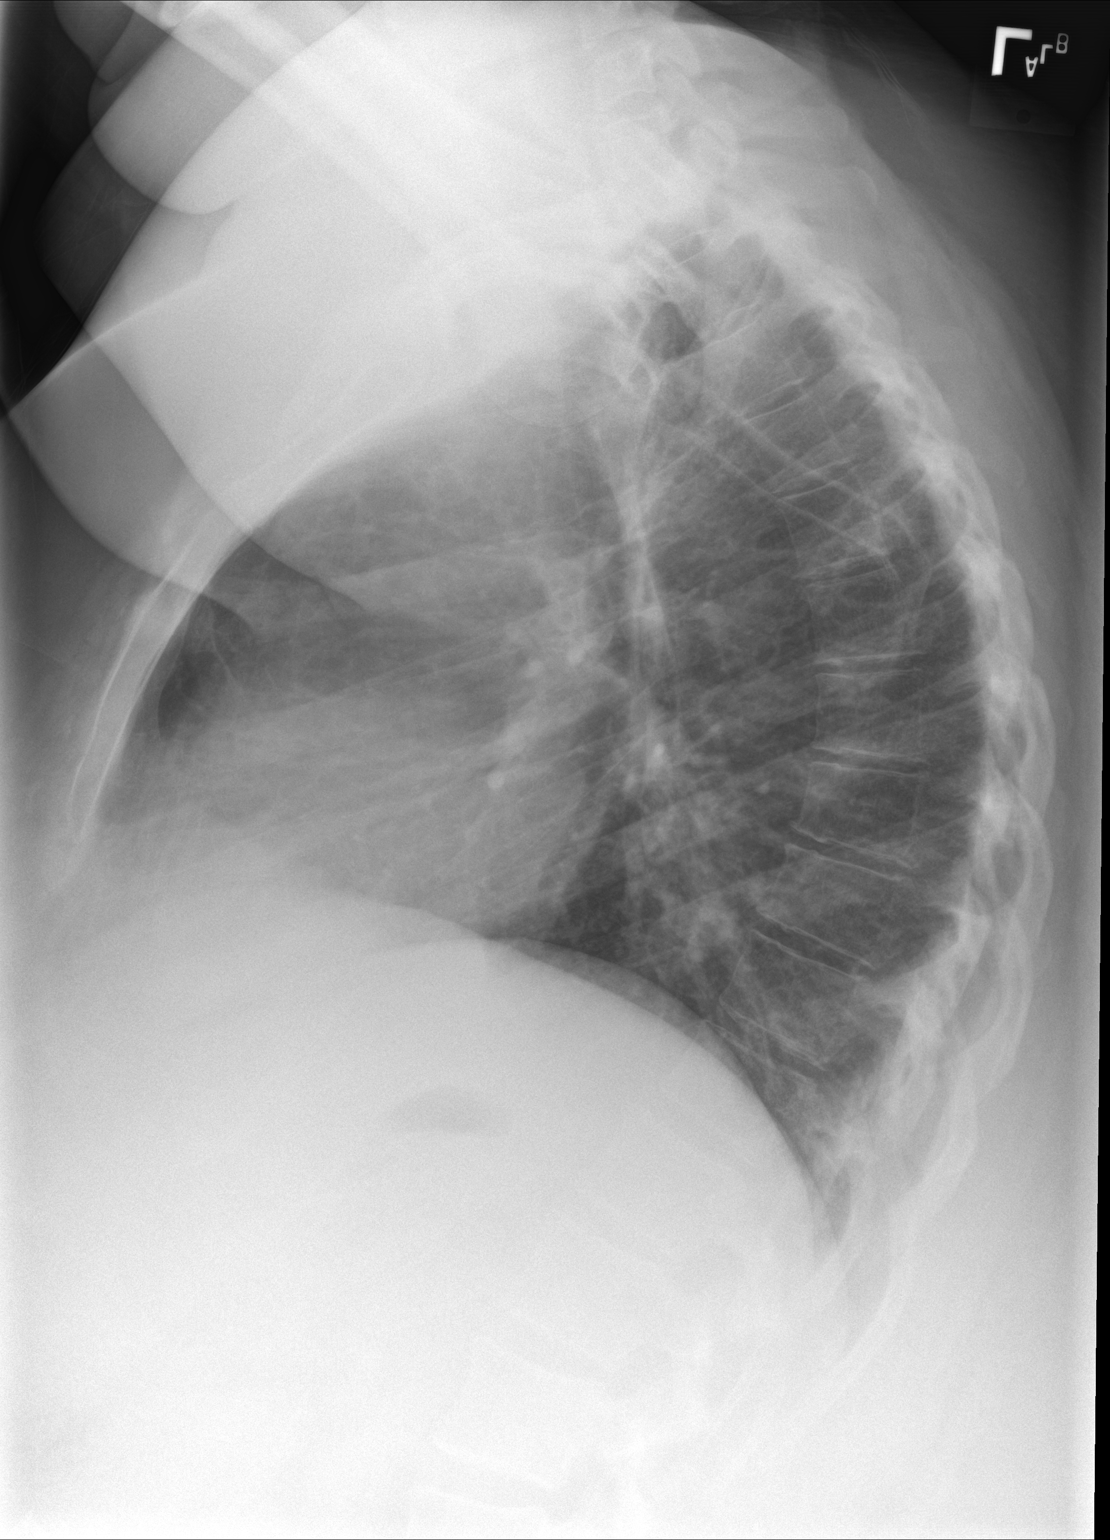

[2 of 2 positions shown; findings below may reference images not displayed]

FINDINGS: Heart and mediastinal contours are within normal limits. No focal
opacities or effusions. No acute bony abnormality.
IMPRESSION: No active cardiopulmonary disease.

## 2016-07-31 MED ORDER — PREDNISONE 20 MG PO TABS: ORAL_TABLET | ORAL | 0 refills | Status: DC

## 2016-07-31 NOTE — ED Provider Notes (Signed)
[facility]    CSN: [id number] Arrival date & time: 07/31/16  4098  First Provider Contact:  None       History   Chief Complaint Chief Complaint  Patient presents with  . Wheezing    HPI Tanya Reeves is a 30 y.o. female.   HPI: Patient presents with symptoms of wheezing, cough, and shortness of breath for over a week. Patient does have asthma and is morbidly obese. Patient was prescribed antibiotics, steroids, and was told to continue her inhalers. Patient states She felt better while she was on her steroids but her symptoms have returned. Patient denies any history of DVT or PE in the past.  Past Medical History:  Diagnosis Date  . Asthma   . Chronic hip pain   . Hypertension   . Migraines   . Morbid obesity (HCC) 03/17/2016  . Sleep apnea     Patient Active Problem List   Diagnosis Date Noted  . Morbid obesity (HCC) 03/17/2016  . IFG (impaired fasting glucose) 03/01/2016  . Urinary frequency 02/26/2016  . Dysuria 02/26/2016  . Essential hypertension, benign 02/26/2016  . Migraine without aura 02/26/2016  . OSA on CPAP 02/26/2016  . Candidiasis 02/26/2016  . Medication monitoring encounter 02/26/2016    Past Surgical History:  Procedure Laterality Date  . CESAREAN SECTION     2  . CHOLECYSTECTOMY    . COLON SURGERY     part of bowel removed due to c-section    OB History    No data available       Home Medications    Prior to Admission medications   Medication Sig Start Date End Date Taking? Authorizing Provider  fluticasone (FLOVENT HFA) 220 MCG/ACT inhaler Inhale into the lungs 2 (two) times daily.   Yes Historical Provider, MD  Fluticasone-Salmeterol (ADVAIR) 250-50 MCG/DOSE AEPB Inhale 1 puff into the lungs 2 (two) times daily.   Yes Historical Provider, MD  lactobacillus acidophilus (BACID) TABS tablet Take 2 tablets by mouth 3 (three) times daily.   Yes Historical Provider, MD  albuterol (PROAIR HFA) 108 (90 Base) MCG/ACT  inhaler Inhale 2 puffs into the lungs every 4 (four) hours as needed. 02/26/16 02/25/17  Kerman Passey, MD  eletriptan (RELPAX) 20 MG tablet Take 1 tablet (20 mg total) by mouth as needed for migraine or headache. May repeat in 2 hours if headache persists or recurs. 02/26/16   Kerman Passey, MD  lisinopril-hydrochlorothiazide (PRINZIDE,ZESTORETIC) 10-12.5 MG tablet Take 1 tablet by mouth daily. 02/26/16   Kerman Passey, MD  nystatin cream (MYCOSTATIN) Apply 1 application topically 2 (two) times daily. As needed 02/26/16   Kerman Passey, MD    Family History Family History  Problem Relation Age of Onset  . Diabetes Mother   . Cancer Mother     femal organs  . Hypoparathyroidism Mother   . Asthma Mother     Social History Social History  Substance Use Topics  . Smoking status: Never Smoker  . Smokeless tobacco: Never Used  . Alcohol use 0.0 oz/week     Comment: wine     Allergies   Cinnamon and Dm-guaifenesin er   Review of Systems Review of Systems: Negative except mentioned above.    Physical Exam Triage Vital Signs ED Triage Vitals  Enc Vitals Group     BP 07/31/16 1018 (!) 157/91     Pulse Rate 07/31/16 1018 87     Resp 07/31/16 1018 17  Temp 07/31/16 1018 97.5 F (36.4 C)     Temp Source 07/31/16 1018 Tympanic     SpO2 07/31/16 1018 98 %     Weight 07/31/16 1018 (!) 347 lb (157.4 kg)     Height 07/31/16 1018 5\' 1"  (1.549 m)     Head Circumference --      Peak Flow --      Pain Score 07/31/16 1019 0     Pain Loc --      Pain Edu? --      Excl. in GC? --    No data found.   Updated Vital Signs BP (!) 157/91 (BP Location: Left Arm)   Pulse 87   Temp 97.5 F (36.4 C) (Tympanic)   Resp 17   Ht 5\' 1"  (1.549 m)   Wt (!) 347 lb (157.4 kg)   LMP 07/14/2016   SpO2 98%   BMI 65.57 kg/m     Physical Exam:  GENERAL: NAD, morbidly obese HEENT: no pharyngeal erythema, no exudate, no cervical LAD  RESP: slight decrease in bases, no accessory muscle use  appreciated  CARD: RRR EXTREM: -Homans NEURO: CN II-XII grossly intact    UC Treatments / Results  Labs (all labs ordered are listed, but only abnormal results are displayed) Labs Reviewed - No data to display  EKG  EKG Interpretation None       Radiology No results found.  Procedures Procedures (including critical care time)  Medications Ordered in UC Medications - No data to display   Initial Impression / Assessment and Plan / UC Course  I have reviewed the triage vital signs and the nursing notes.  Pertinent labs & imaging results that were available during my care of the patient were reviewed by me and considered in my medical decision making (see chart for details).  Clinical Course   A/P: URI, Asthma - Chest x-ray negative for infiltrate, patient is in no distress in the office, labs reviewed with patient as well and D-dimer was within normal limits, recommend patient take another 5 day burst course of prednisone, continue inhalers as prescribed by patient's physician, complete course of antibiotics, if any symptoms worsen go to the ER as discussed. Patient addresses understanding.   Final Clinical Impressions(s) / UC Diagnoses   Final diagnoses:  None    New Prescriptions New Prescriptions   No medications on file     Jolene ProvostKirtida Orlondo [place], MD 07/31/16 1217

## 2016-07-31 NOTE — Discharge Instructions (Signed)
Use inhalers as prescribed, finish out antibiotic course. If any worsening symptoms develop go to the ER as discussed.

## 2016-07-31 NOTE — ED Triage Notes (Signed)
Patient complains of wheezing with coughing. States that she saw a provider last Thursday and was treated for Sinusitis with asthma exacerbation. Patient states that she finished steroids yesterday and breathing worsened. Patient states that she is currently still taking Augmentin.

## 2016-08-02 ENCOUNTER — Ambulatory Visit: Payer: 59 | Admitting: Family Medicine

## 2016-08-06 ENCOUNTER — Ambulatory Visit
Admission: RE | Admit: 2016-08-06 | Discharge: 2016-08-06 | Disposition: A | Discharge status: Home / Self Care | Payer: 59 | Source: Ambulatory Visit | Attending: Family Medicine | Admitting: Family Medicine

## 2016-08-06 ENCOUNTER — Other Ambulatory Visit: Payer: Self-pay | Admitting: Family Medicine

## 2016-08-06 ENCOUNTER — Encounter: Payer: Self-pay | Admitting: Family Medicine

## 2016-08-06 ENCOUNTER — Ambulatory Visit (INDEPENDENT_AMBULATORY_CARE_PROVIDER_SITE_OTHER): Payer: 59 | Admitting: Family Medicine

## 2016-08-06 DIAGNOSIS — J209 Acute bronchitis, unspecified: Secondary | ICD-10-CM

## 2016-08-06 IMAGING — CR DG CHEST 2V
1 series · 2 of 2 positions shown · non-contrast
Comparison: [DATE]

CLINICAL DATA: Cough and wheezing for several days

EXAM:
CHEST  2 VIEW

[Series 1: dg chest 2 view · 0.14mm/px · 2 of 2 slices shown]
[im 1/2]
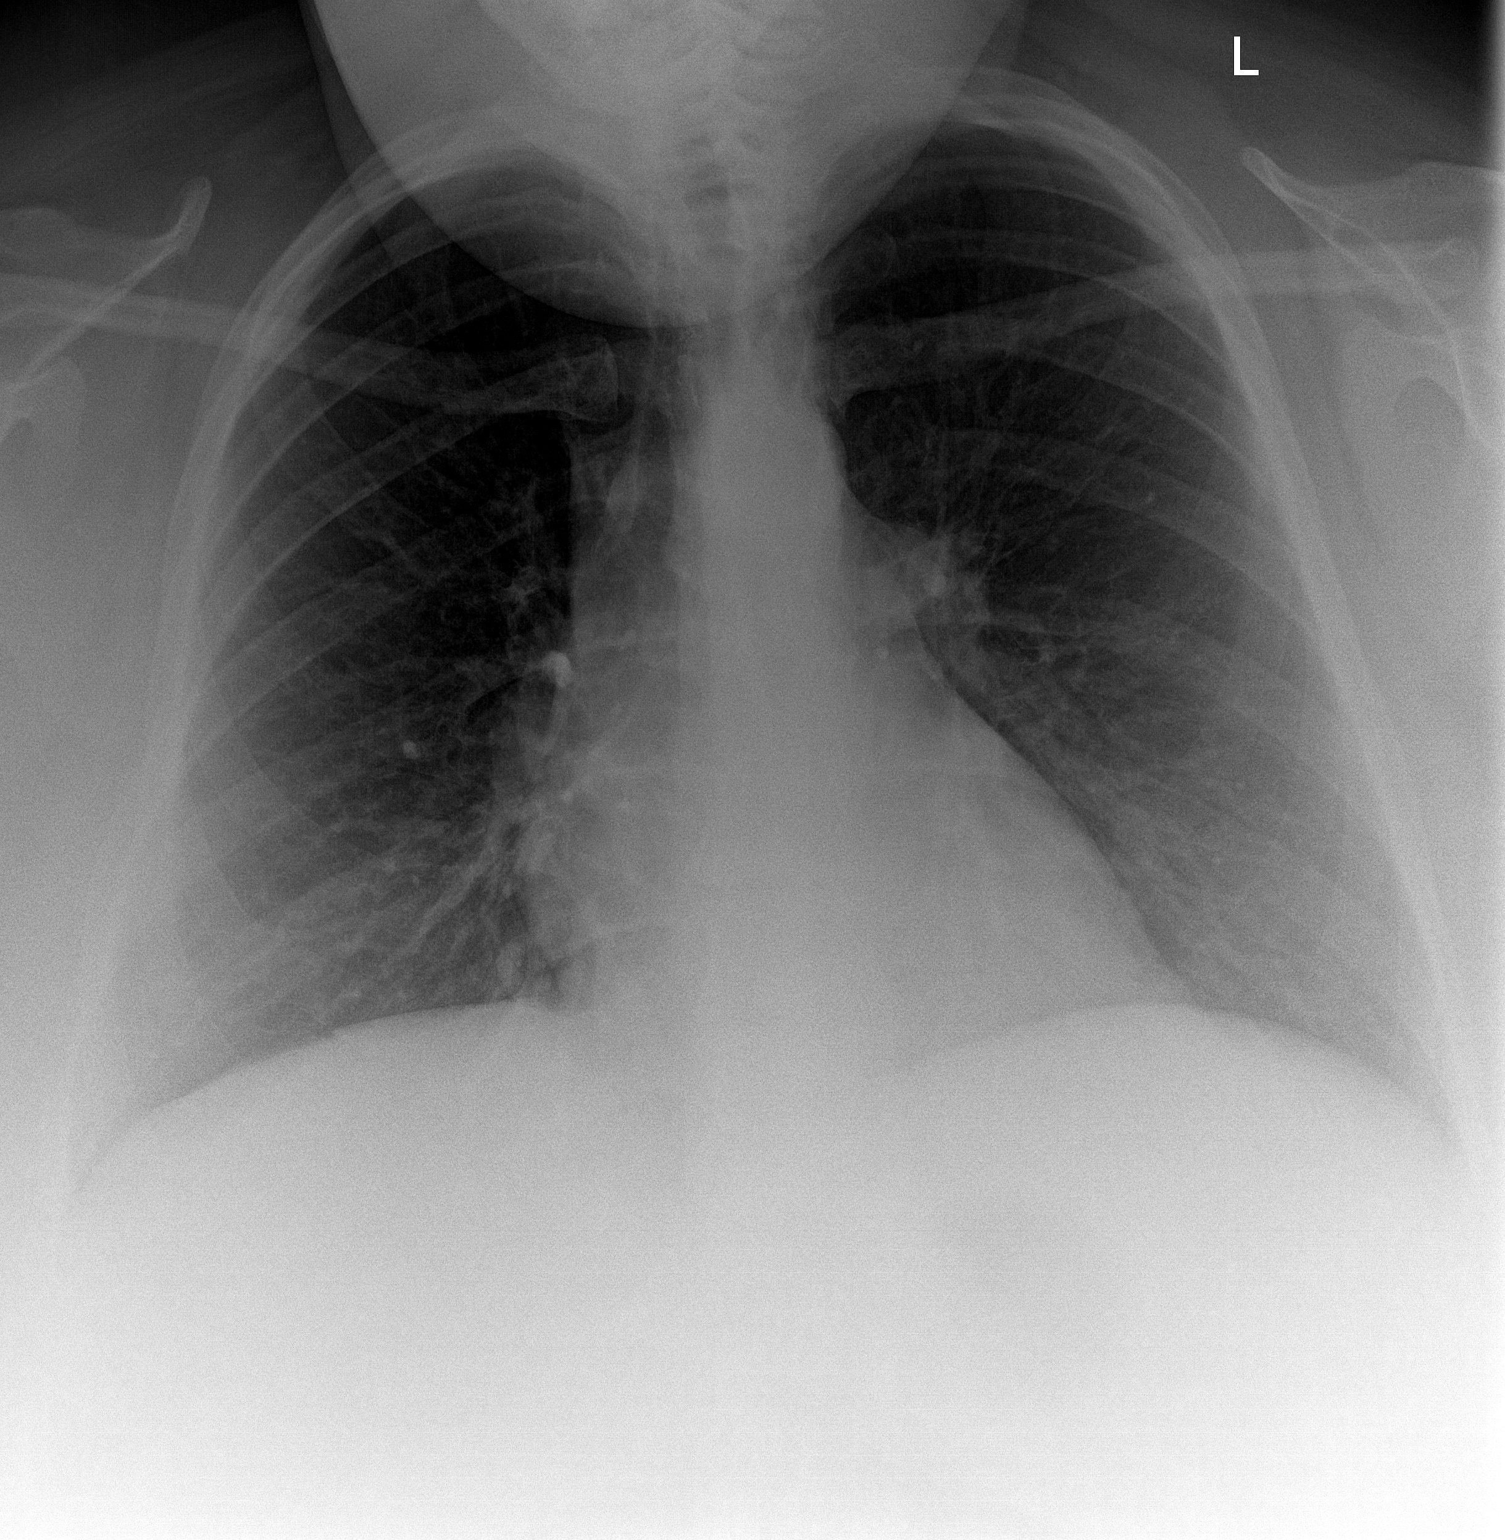
[im 2/2]
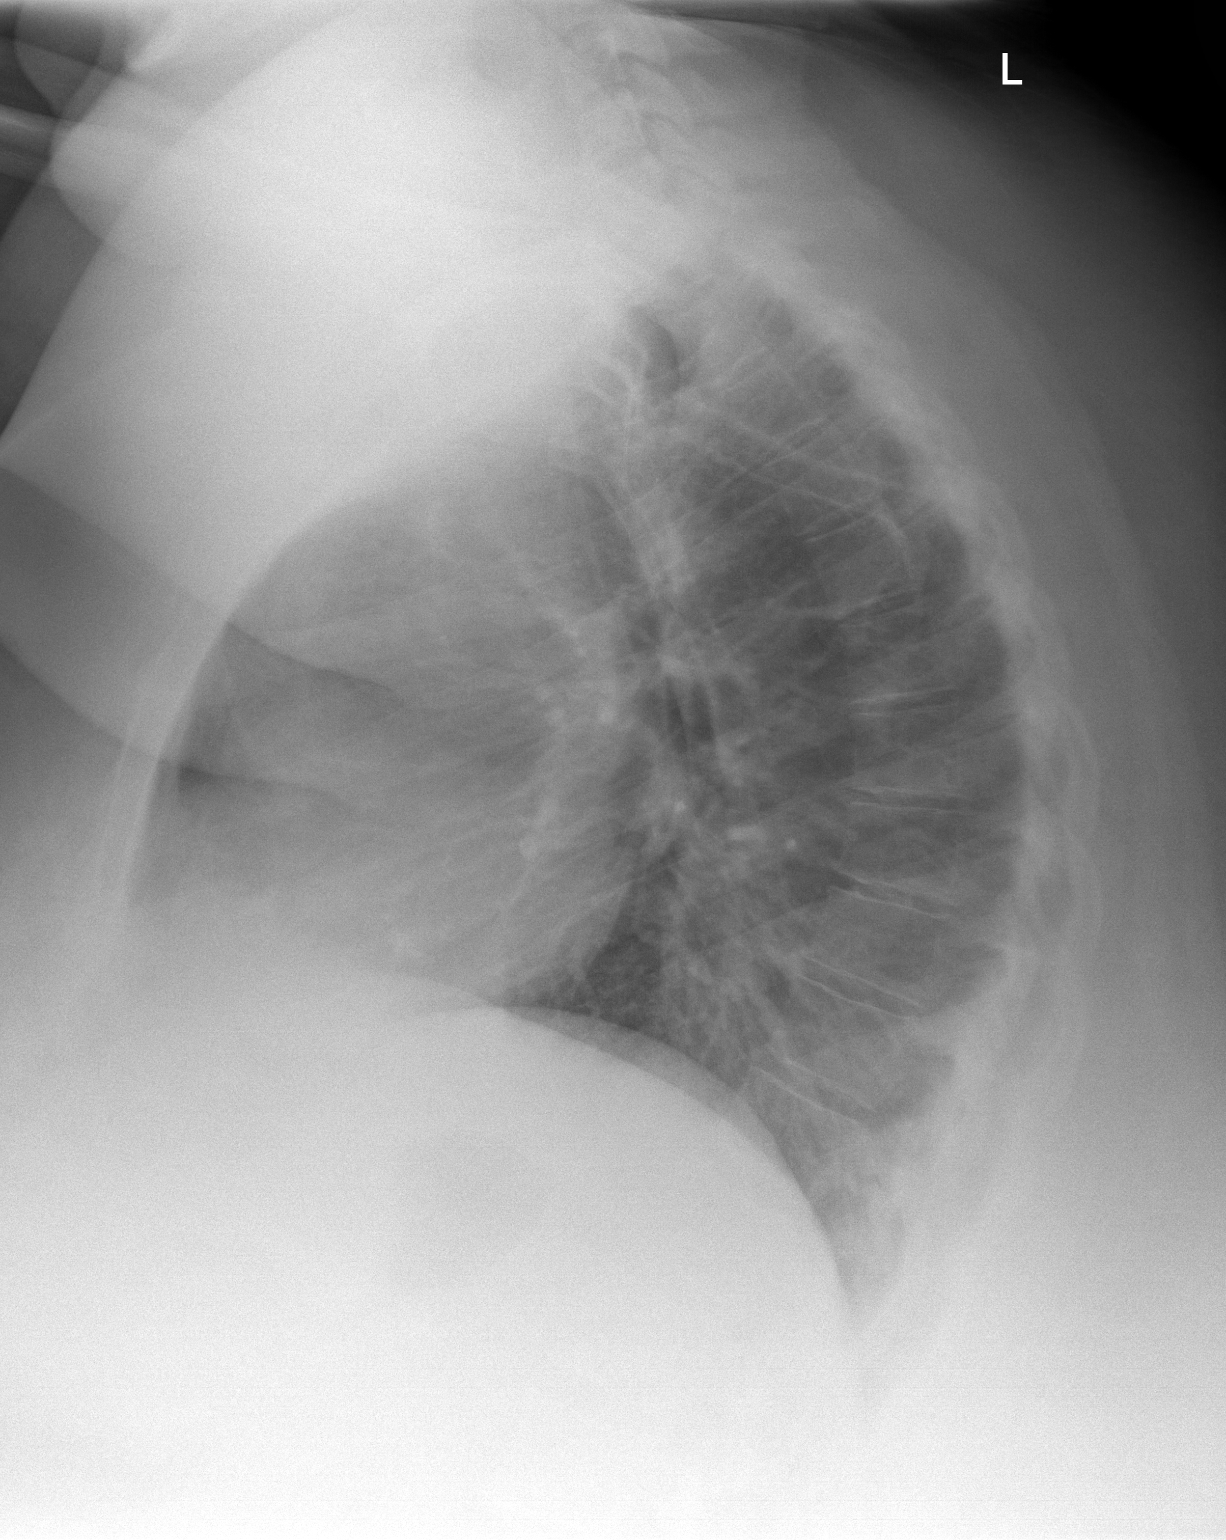

[2 of 2 positions shown; findings below may reference images not displayed]

FINDINGS: The heart size and mediastinal contours are within normal limits.
Both lungs are clear. The visualized skeletal structures are
unremarkable.
IMPRESSION: No active cardiopulmonary disease.

## 2016-08-06 MED ORDER — PREDNISONE 10 MG (21) PO TBPK: 10.0000 mg | ORAL_TABLET | Freq: Every day | ORAL | 0 refills | Status: DC

## 2016-08-06 MED ORDER — GUAIFENESIN-CODEINE 100-10 MG/5ML PO SYRP: 10.0000 mL | ORAL_SOLUTION | Freq: Four times a day (QID) | ORAL | 0 refills | Status: DC | PRN

## 2016-08-06 MED ORDER — ALBUTEROL SULFATE (2.5 MG/3ML) 0.083% IN NEBU: 2.5000 mg | INHALATION_SOLUTION | Freq: Four times a day (QID) | RESPIRATORY_TRACT | 1 refills | Status: DC | PRN

## 2016-08-06 MED ORDER — MOXIFLOXACIN HCL 400 MG PO TABS: 400.0000 mg | ORAL_TABLET | Freq: Every day | ORAL | 0 refills | Status: AC

## 2016-08-06 MED ORDER — GUAIFENESIN-CODEINE 100-10 MG/5ML PO SYRP: 10.0000 mL | ORAL_SOLUTION | Freq: Four times a day (QID) | ORAL | 0 refills | Status: AC | PRN

## 2016-08-06 NOTE — Progress Notes (Signed)
Name: Tanya Reeves   MRN: [id number]    DOB: 1986-03-23   Date:08/06/2016       Progress Note  Subjective  Chief Complaint  No chief complaint on file.   Wheezing   This is a new problem. The current episode started 1 to 4 weeks ago. Associated symptoms include chills, coughing, headaches, shortness of breath and sputum production. Pertinent negatives include no fever. She has tried oral steroids, steroid inhaler and beta agonist inhalers for the symptoms. The treatment provided moderate relief. Her past medical history is significant for asthma. There is no history of chronic lung disease or COPD.  Initially evaluated on 9 by video visit, started on antibiotics and steroids, later seen at urgent care and started another burst of steroids along with continuation of inhalers. She feels as if she is moderately improved but still has persistent coughing, wheezing.    Past Medical History:  Diagnosis Date  . Asthma   . Chronic hip pain   . Hypertension   . Migraines   . Morbid obesity (HCC) 03/17/2016  . Sleep apnea     Past Surgical History:  Procedure Laterality Date  . CESAREAN SECTION     2  . CHOLECYSTECTOMY    . COLON SURGERY     part of bowel removed due to c-section    Family History  Problem Relation Age of Onset  . Diabetes Mother   . Cancer Mother     femal organs  . Hypoparathyroidism Mother   . Asthma Mother     Social History   Social History  . Marital status: Married    Spouse name: N/A  . Number of children: N/A  . Years of education: N/A   Occupational History  . Not on file.   Social History Main Topics  . Smoking status: Never Smoker  . Smokeless tobacco: Never Used  . Alcohol use 0.0 oz/week     Comment: wine  . Drug use: No  . Sexual activity: Yes   Other Topics Concern  . Not on file   Social History Narrative  . No narrative on file     Current Outpatient Prescriptions:  .  albuterol (PROAIR HFA) 108 (90 Base) MCG/ACT  inhaler, Inhale 2 puffs into the lungs every 4 (four) hours as needed., Disp: 1 Inhaler, Rfl: 2 .  eletriptan (RELPAX) 20 MG tablet, Take 1 tablet (20 mg total) by mouth as needed for migraine or headache. May repeat in 2 hours if headache persists or recurs., Disp: 6 tablet, Rfl: 3 .  fluticasone (FLOVENT HFA) 220 MCG/ACT inhaler, Inhale into the lungs 2 (two) times daily., Disp: , Rfl:  .  Fluticasone-Salmeterol (ADVAIR) 250-50 MCG/DOSE AEPB, Inhale 1 puff into the lungs 2 (two) times daily., Disp: , Rfl:  .  lactobacillus acidophilus (BACID) TABS tablet, Take 2 tablets by mouth 3 (three) times daily., Disp: , Rfl:  .  lisinopril-hydrochlorothiazide (PRINZIDE,ZESTORETIC) 10-12.5 MG tablet, Take 1 tablet by mouth daily., Disp: 30 tablet, Rfl: 5 .  nystatin cream (MYCOSTATIN), Apply 1 application topically 2 (two) times daily. As needed, Disp: 30 g, Rfl: 2 .  predniSONE (DELTASONE) 20 MG tablet, Take two tablets once daily for 5 days. (Patient not taking: Reported on 08/06/2016), Disp: 10 tablet, Rfl: 0  Allergies  Allergen Reactions  . Cinnamon Other (See Comments)    Tongue swells up. With artificial (such as in gum/red hots)  . Dm-Guaifenesin Er      Review of Systems  Constitutional: Positive for chills. Negative for fever.  Respiratory: Positive for cough, sputum production, shortness of breath and wheezing.   Neurological: Positive for headaches.    Objective  Vitals:   08/06/16 1117  BP: 139/82  Pulse: (!) 124  Resp: 18  Temp: 98.1 F (36.7 C)  TempSrc: Oral  SpO2: 96%  Weight: (!) 370 lb 6.4 oz (168 kg)  Height: 5\' 3"  (1.6 m)    Physical Exam  Constitutional: She is oriented to person, place, and time and well-developed, well-nourished, and in no distress.  HENT:  Head: Normocephalic and atraumatic.  Mouth/Throat: Posterior oropharyngeal erythema present.  Cardiovascular: Regular rhythm, S1 normal and S2 normal.  Tachycardia present.   No murmur heard. HR on  auscultation: 90 bpm  Pulmonary/Chest: No respiratory distress. She has decreased breath sounds. She has wheezes in the left upper field, the left middle field and the left lower field.  Neurological: She is alert and oriented to person, place, and time.  Nursing note and vitals reviewed.     Assessment & Plan  1. Acute bronchitis, unspecified organism We will start on a 6 day course of steroids, changed antibiotic to a broader coverage fluoroquinolone, obtain chest x-ray to rule out pneumonia and added nebulizer treatments. She should see improvement and complete resolution within a few days. Reassess if symptoms are still persistent. - predniSONE (STERAPRED UNI-PAK 21 TAB) 10 MG (21) TBPK tablet; Take 1 tablet (10 mg total) by mouth daily. 60 50 40 30 20 10  then STOP  Dispense: 21 tablet; Refill: 0 - moxifloxacin (AVELOX) 400 MG tablet; Take 1 tablet (400 mg total) by mouth daily at 8 pm.  Dispense: 5 tablet; Refill: 0 - guaiFENesin-codeine (CHERATUSSIN AC) 100-10 MG/5ML syrup; Take 10 mLs by mouth 4 (four) times daily as needed for cough.  Dispense: 473 mL; Refill: 0 - DG Chest 2 View; Future - albuterol (PROVENTIL) (2.5 MG/3ML) 0.083% nebulizer solution; Take 3 mLs (2.5 mg total) by nebulization every 6 (six) hours as needed for wheezing or shortness of breath.  Dispense: 150 mL; Refill: 1   Demauri Advincula Asad [facility] 08/06/2016 11:54 AM

## 2016-08-09 ENCOUNTER — Ambulatory Visit: Payer: 59 | Admitting: Family Medicine

## 2016-08-13 ENCOUNTER — Other Ambulatory Visit: Payer: Self-pay | Admitting: Family Medicine

## 2016-08-13 DIAGNOSIS — I10 Essential (primary) hypertension: Secondary | ICD-10-CM

## 2016-08-13 NOTE — Telephone Encounter (Signed)
Sept BMP reviewed; Rx approved

## 2016-08-26 ENCOUNTER — Encounter: Payer: Self-pay | Admitting: Family Medicine

## 2016-08-26 ENCOUNTER — Ambulatory Visit (INDEPENDENT_AMBULATORY_CARE_PROVIDER_SITE_OTHER): Payer: 59 | Admitting: Family Medicine

## 2016-08-26 VITALS — BP 132/84 | HR 95 | Temp 98.4°F | Resp 16 | Wt 374.0 lb

## 2016-08-26 DIAGNOSIS — H6981 Other specified disorders of Eustachian tube, right ear: Secondary | ICD-10-CM

## 2016-08-26 DIAGNOSIS — R0602 Shortness of breath: Secondary | ICD-10-CM

## 2016-08-26 DIAGNOSIS — I10 Essential (primary) hypertension: Secondary | ICD-10-CM | POA: Diagnosis not present

## 2016-08-26 HISTORY — DX: Shortness of breath: R06.02

## 2016-08-26 NOTE — Assessment & Plan Note (Signed)
Controlled with meds; she does not think the ACE-I contributes to her SHOB, dyspnea, or cough

## 2016-08-26 NOTE — Patient Instructions (Addendum)
Check out the information at familydoctor.org entitled "Nutrition for Weight Loss: What You Need to Know about Fad Diets" Try to lose between 1-2 pounds per week by taking in fewer calories and burning off more calories You can succeed by limiting portions, limiting foods dense in calories and fat, becoming more active, and drinking 8 glasses of water a day (64 ounces) Don't skip meals, especially breakfast, as skipping meals may alter your metabolism Do not use over-the-counter weight loss pills or gimmicks that claim rapid weight loss A healthy BMI (or body mass index) is between 18.5 and 24.9 You can calculate your ideal BMI at the NIH website [employer]huhttp://www.nhlbi.nih.gov/health/educational/lose_wt/BMI/bmicalc.htm  Let's refer to a nutritionist and to a bariatric surgeon  We'll have you see the pulmonologist Stop the Flovent, don't use both Flovent or Advair

## 2016-08-26 NOTE — Assessment & Plan Note (Signed)
Persistent cough for 5 weeks; two rounds of antibotics, four rounds of prednisone; refer to pulmonologist; I will not re-image today since she has had two xrays per her report

## 2016-08-26 NOTE — Assessment & Plan Note (Signed)
Refer to nutritionist; see AVS; refer for bariatric surgery

## 2016-08-26 NOTE — Progress Notes (Signed)
BP 132/84   Pulse 95   Temp 98.4 F (36.9 C) (Oral)   Resp 16   Wt (!) 374 lb (169.6 kg)   LMP 08/14/2016 Comment: denies preg, has had tubaligation  SpO2 97%   BMI 66.25 kg/m    Subjective:    Patient ID: Tanya Reeves, female    DOB: 1985/12/16, 30 y.o.   MRN: [id number]  HPI: Tanya Reeves is a 30 y.o. female  Chief Complaint  Patient presents with  . Follow-up   She had been to urgent care and [facility]; treated for 2 weeks; treated for pneumonia even though 2 clear chest xrays; coughing every 2-3 minutes; she is slightly wheezing; four rounds of prednisone; it's the craziest thing she says; she has never had anything like this; her O2 sats dropped with exertion, walking around, then would drop; still feels short of breath even at rest; no fever in weeks; sickness going on for a total of 5 weeks and it's better, but not gone; O2 sats are more stable, but not staying in the high 90s with exertion; could drop to 89% but now dropping to 93%; nobody should need four rounds of steroids and two rounds antibiotics (Augmentin and Avelox); she does not think it's the lisinopril; she has been using Flovent and Advair; both daughters are "snotty", but nothing like what she has had; husband has not gotten sick; no hemoptysis; small amoutnn of postnasal drip, white or green, but very scant every few days; she has trouble hearing out of her right ear  Sept 19, 2017: Study Result   CLINICAL DATA:  Cough and wheezing for several days  EXAM: CHEST  2 VIEW  COMPARISON:  07/31/2016  FINDINGS: The heart size and mediastinal contours are within normal limits. Both lungs are clear. The visualized skeletal structures are unremarkable.  IMPRESSION: No active cardiopulmonary disease.   Electronically Signed   By: Alcide Clever M.D.   On: 08/06/2016 13:46   She has been steady with her weight since having her baby; what she's doing is not working; joined a gym; timing is not  working out; tried portion control, eating better; doesn't eat fast food often; works from home; skips meals; when she does eat, larger portions; joined the gym, joined for a year, but only went once a week because of schedule change at work; considering bariatric surgery; she lost 100 pounds in high school, she was a runner; can't run now  Depression screen [facility] 2/9 08/06/2016 02/26/2016  Decreased Interest 0 0  Down, Depressed, Hopeless 0 0  PHQ - 2 Score 0 0   Relevant past medical, surgical, family and social history reviewed Past Medical History:  Diagnosis Date  . Asthma   . Chronic hip pain   . Hypertension   . Migraines   . Morbid obesity (HCC) 03/17/2016  . Sleep apnea    Past Surgical History:  Procedure Laterality Date  . CESAREAN SECTION     2  . CHOLECYSTECTOMY    . COLON SURGERY     part of bowel removed due to c-section   Family History  Problem Relation Age of Onset  . Diabetes Mother   . Cancer Mother     femal organs  . Hypoparathyroidism Mother   . Asthma Mother    Social History  Substance Use Topics  . Smoking status: Never Smoker  . Smokeless tobacco: Never Used  . Alcohol use 0.0 oz/week     Comment: wine  Interim medical history since last visit reviewed. Allergies and medications reviewed  Review of Systems Per HPI unless specifically indicated above     Objective:    BP 132/84   Pulse 95   Temp 98.4 F (36.9 C) (Oral)   Resp 16   Wt (!) 374 lb (169.6 kg)   LMP 08/14/2016 Comment: denies preg, has had tubaligation  SpO2 97%   BMI 66.25 kg/m   Wt Readings from Last 3 Encounters:  08/26/16 (!) 374 lb (169.6 kg)  08/06/16 (!) 370 lb 6.4 oz (168 kg)  07/31/16 (!) 347 lb (157.4 kg)   MD note: big different in her weight  Physical Exam  Constitutional: She appears well-developed and well-nourished. No distress.  HENT:  Head: Normocephalic and atraumatic.  Right Ear: Tympanic membrane, external ear and ear canal normal. No drainage.  Tympanic membrane is not erythematous. No middle ear effusion. Decreased hearing is noted.  Left Ear: Hearing, tympanic membrane, external ear and ear canal normal. No drainage. Tympanic membrane is not erythematous.  No middle ear effusion. No decreased hearing is noted.  Nose: No rhinorrhea.  Mouth/Throat: Mucous membranes are normal. Mucous membranes are not dry.  Eyes: EOM are normal. No scleral icterus.  Neck: No thyromegaly present.  Cardiovascular: Normal rate, regular rhythm and normal heart sounds.   No murmur heard. Pulmonary/Chest: Effort normal and breath sounds normal. No respiratory distress. She has no wheezes.  Abdominal: Soft. Bowel sounds are normal. She exhibits no distension.  Musculoskeletal: Normal range of motion. She exhibits no edema.  Lymphadenopathy:    She has no cervical adenopathy.       Right: No supraclavicular adenopathy present.       Left: No supraclavicular adenopathy present.  Neurological: She is alert. She exhibits normal muscle tone.  Skin: Skin is warm and dry. She is not diaphoretic. No pallor.  Psychiatric: She has a normal mood and affect. Her behavior is normal. Judgment and thought content normal.    Results for orders placed or performed during the hospital encounter of 07/31/16  CBC with Differential  Result Value Ref Range   WBC 10.4 3.6 - 11.0 K/uL   RBC 4.75 3.80 - 5.20 MIL/uL   Hemoglobin 13.1 12.0 - 16.0 g/dL   HCT 16.139.5 09.635.0 - 04.547.0 %   MCV 83.2 80.0 - 100.0 fL   MCH 27.7 26.0 - 34.0 pg   MCHC 33.3 32.0 - 36.0 g/dL   RDW 40.914.9 (H) 81.111.5 - 91.414.5 %   Platelets 253 150 - 440 K/uL   Neutrophils Relative % 55 %   Neutro Abs 5.6 1.4 - 6.5 K/uL   Lymphocytes Relative 34 %   Lymphs Abs 3.6 1.0 - 3.6 K/uL   Monocytes Relative 7 %   Monocytes Absolute 0.8 0.2 - 0.9 K/uL   Eosinophils Relative 3 %   Eosinophils Absolute 0.3 0 - 0.7 K/uL   Basophils Relative 1 %   Basophils Absolute 0.1 0 - 0.1 K/uL  Basic metabolic panel  Result Value  Ref Range   Sodium 135 135 - 145 mmol/L   Potassium 3.8 3.5 - 5.1 mmol/L   Chloride 102 101 - 111 mmol/L   CO2 25 22 - 32 mmol/L   Glucose, Bld 96 65 - 99 mg/dL   BUN 19 6 - 20 mg/dL   Creatinine, Ser 7.820.58 0.44 - 1.00 mg/dL   Calcium 9.4 8.9 - 95.610.3 mg/dL   GFR calc non Af Amer >60 >60 mL/min  GFR calc Af Amer >60 >60 mL/min   Anion gap 8 5 - 15  Fibrin derivatives D-Dimer  Result Value Ref Range   Fibrin derivatives D-dimer ([facility]) 220 0 - 499      Assessment & Plan:   Problem List Items Addressed This Visit      Cardiovascular and Mediastinum   Essential hypertension, benign    Controlled with meds; she does not think the ACE-I contributes to her SHOB, dyspnea, or cough        Other   Shortness of breath on exertion - Primary    Persistent cough for 5 weeks; two rounds of antibotics, four rounds of prednisone; refer to pulmonologist; I will not re-image today since she has had two xrays per her report      Relevant Orders   Ambulatory referral to Pulmonology   Morbid obesity (HCC)    Refer to nutritionist; see AVS; refer for bariatric surgery      Relevant Orders   Amb ref to Medical Nutrition Therapy-MNT   Ambulatory referral to General Surgery    Other Visit Diagnoses    Eustachian tube dysfunction, right       Afrin x 3 days and 3 days only; spray, lay, roll       Follow up plan: Return in about 4 weeks (around 09/23/2016).  An after-visit summary was printed and given to the patient at check-out.  Please see the patient instructions which may contain other information and recommendations beyond what is mentioned above in the assessment and plan.  No orders of the defined types were placed in this encounter.   Orders Placed This Encounter  Procedures  . Ambulatory referral to Pulmonology  . Amb ref to Medical Nutrition Therapy-MNT  . Ambulatory referral to General Surgery

## 2016-09-06 ENCOUNTER — Other Ambulatory Visit: Payer: Self-pay | Admitting: Family Medicine

## 2016-09-06 DIAGNOSIS — I10 Essential (primary) hypertension: Secondary | ICD-10-CM

## 2016-09-07 NOTE — Telephone Encounter (Signed)
Please resolve lisinopril/hctz Rx request with pharmacy; I approved a 6 month supply on 08/13/16; thank you

## 2016-09-22 NOTE — Progress Notes (Signed)
[facility]      Assessment and Plan:  Dyspnea due to allergic asthma exacerbation.  --Appears responsive to benadryl, continue twice daily.  --Continue advair twice daily.  --Singulair  -She is asked to call back in 2-4 weeks with progress, if improved, can follow-up when necessary, otherwise she should follow up within 4 weeks.  Obstructive Sleep Apnea.  -Known history of obstructive sleep apnea, continue using CPAP.  Morbid Obesity.  -BMI equals 65. -Likely contributing to dyspnea, discussed importance of weight loss.  Date: 09/22/2016  MRN# [id number] Tanya BleakKristen C Reeves 1986-02-22   Tanya BleakKristen C Reeves is a 30 y.o. old female seen in consultation for chief complaint of:    Chief Complaint  Patient presents with  . pulmonary consult    per Dr. Sherie DonLada. pt states she's recently has been treated for URI and PNA. pt has had 2 round abx and 4 rounds of prednisone over the past14wk. pt had been off of prednisone X 3wk. pt currently c/o sob with exertion, wheezing & prod cough with yellow to greenish mucus. pt denies any fever, sweats or chills.     HPI:  Her troubles began URTI and continuous cough about 14 weeks ago. She received a course of augmentin and steroids. She did not improve, then she got a course of avelox and another course of steroids, with mild improvement but not to baseline.  She has upper airway wheezing, and noted that when the prednisone finished the cough came back.  She got another course of steroids x 2; the cough always seems to come back, and has pulled a muscle.  She also has a lot of mucus drainage, she is currently using albuterol, she ran out of advair.   She does have similar problems in previous years, but they usually resolved with a course of abx and steroids. She notes that her does have dyspnea, which was much worse before.   At the time this came on, [place] travelled to [place], no recent move, no new bedding or  furniture, no new pets.  They do have a dog, sleeps in bedroom on floor.  She has never been tested for allergies in the past, she notes that she has occasional hives periodically, mostly in the springtime. She was diagnosed with asthma in 2011; at that time she had severe hives at that time and she had trouble at that time.   She usually uses her inhaler in the springtime a few times, but otherwise will not have trouble, but when she gets a cold she get worse.   She has a history of gerd in the past and was waking up with a sore throat. She wears cpap every night. She takes zantac OTC periodically. She takes claritin alternating with zyrtec, and benadryl which helps a lot.  She does not take singulair, she does not smoke and is not around smoke.   Review of CXR images 9/13 & 08/06/16; morbid obesity with reduced lung volumes, possible atelectasis.  Review of CT abdomen lung images 08/09/13; normal lung bases; diffusely fatty liver.  CBC 07/31/16; Abs Eos=300  PMHX:   Past Medical History:  Diagnosis Date  . Asthma   . Chronic hip pain   . Hypertension   . Migraines   . Morbid obesity (HCC) 03/17/2016  . Sleep apnea    Surgical Hx:  Past Surgical History:  Procedure Laterality Date  . CESAREAN SECTION     2  . CHOLECYSTECTOMY    .  COLON SURGERY     part of bowel removed due to c-section   Family Hx:  Family History  Problem Relation Age of Onset  . Diabetes Mother   . Cancer Mother     femal organs  . Hypoparathyroidism Mother   . Asthma Mother    Social Hx:   Social History  Substance Use Topics  . Smoking status: Never Smoker  . Smokeless tobacco: Never Used  . Alcohol use 0.0 oz/week     Comment: wine   Medication:   reviewed    Allergies:  Cinnamon and Dm-guaifenesin er  Review of Systems: Gen:  Denies  fever, sweats, chills HEENT: Denies blurred vision, double vision. bleeds, sore throat Cvc:  No dizziness, chest pain. Resp:   Denies putum production Gi:  Denies swallowing difficulty, stomach pain. Gu:  Denies bladder incontinence, burning urine Ext:   No Joint pain, stiffness. Skin: No skin rash,  hives  Endoc:  No polyuria, polydipsia. Psych: No depression, insomnia. Other:  All other systems were reviewed with the patient and were negative other that what is mentioned in the HPI.   Physical Examination:   VS: BP 134/72 (BP Location: Left Arm, Cuff Size: Normal)   Pulse 86   Ht 5\' 3"  (1.6 m)   Wt (!) 368 lb (166.9 kg)   LMP 08/14/2016 Comment: denies preg, has had tubaligation  SpO2 96%   BMI 65.19 kg/m   General Appearance: No distress . Morbid obesity. Neuro:without focal findings,  speech normal,  HEENT: PERRLA, EOM intact.  Mallampati 3. Pulmonary: normal breath sounds, No wheezing.  CardiovascularNormal S1,S2.  No m/r/g.   Abdomen: Benign, Soft, non-tender. Renal:  No costovertebral tenderness  GU:  No performed at this time. Endoc: No evident thyromegaly, no signs of acromegaly. Skin:   warm, no rashes, no ecchymosis  Extremities: normal, no cyanosis, clubbing.  Other findings:    LABORATORY PANEL:   CBC No results for input(s): WBC, HGB, HCT, PLT in the last 168 hours. ------------------------------------------------------------------------------------------------------------------  Chemistries  No results for input(s): NA, K, CL, CO2, GLUCOSE, BUN, CREATININE, CALCIUM, MG, AST, ALT, ALKPHOS, BILITOT in the last 168 hours.  Invalid input(s): GFRCGP ------------------------------------------------------------------------------------------------------------------  Cardiac Enzymes No results for input(s): TROPONINI in the last 168 hours. ------------------------------------------------------------  RADIOLOGY:  No results found.     Thank  you for the consultation and for allowing [facility] to assist in the care of your patient. Our recommendations are noted above.  Please contact  us if we can be of further service.   Wells Guileseep Mykael Batz, MD.  Board Certified in Internal Medicine, Pulmonary Medicine, Critical Care Medicine, and Sleep Medicine.   Pulmonary and Critical Care Office Number: [phone]  Santiago Gladavid Kasa, M.D.  Stephanie AcreVishal Mungal, M.D.  Billy Fischeravid Simonds, M.D  09/22/2016

## 2016-09-23 ENCOUNTER — Encounter: Payer: Self-pay | Admitting: Internal Medicine

## 2016-09-23 ENCOUNTER — Ambulatory Visit (INDEPENDENT_AMBULATORY_CARE_PROVIDER_SITE_OTHER): Payer: 59 | Admitting: Internal Medicine

## 2016-09-23 VITALS — BP 134/72 | HR 86 | Ht 63.0 in | Wt 368.0 lb

## 2016-09-23 DIAGNOSIS — J4551 Severe persistent asthma with (acute) exacerbation: Secondary | ICD-10-CM | POA: Diagnosis not present

## 2016-09-23 MED ORDER — FLUTICASONE-SALMETEROL 250-50 MCG/DOSE IN AEPB: 1.0000 | INHALATION_SPRAY | Freq: Two times a day (BID) | RESPIRATORY_TRACT | 3 refills | Status: DC

## 2016-09-23 MED ORDER — MONTELUKAST SODIUM 10 MG PO TABS: 10.0000 mg | ORAL_TABLET | Freq: Every day | ORAL | 2 refills | Status: DC

## 2016-09-23 MED ORDER — BENZONATATE 200 MG PO CAPS: 200.0000 mg | ORAL_CAPSULE | Freq: Three times a day (TID) | ORAL | 1 refills | Status: DC

## 2016-09-23 NOTE — Patient Instructions (Addendum)
--  Tessalon 200 mg three times daily.   --Continue benadryl 2 to 3 times per day.  --Start singulair 10 mg every night.   --Advair diskus 250 1 puff twice daily 3 month supply, 3 refills.   --Start prilosec OTC once daily.   --Start flonase OTC sprays in each nostril every night.

## 2016-09-26 ENCOUNTER — Institutional Professional Consult (permissible substitution): Payer: 59 | Admitting: Emergency Medicine

## 2016-10-02 ENCOUNTER — Ambulatory Visit (INDEPENDENT_AMBULATORY_CARE_PROVIDER_SITE_OTHER): Payer: 59 | Admitting: Family Medicine

## 2016-10-02 ENCOUNTER — Encounter: Payer: Self-pay | Admitting: Family Medicine

## 2016-10-02 VITALS — BP 124/72 | HR 93 | Temp 97.8°F | Resp 16 | Wt 371.0 lb

## 2016-10-02 DIAGNOSIS — E781 Pure hyperglyceridemia: Secondary | ICD-10-CM | POA: Diagnosis not present

## 2016-10-02 DIAGNOSIS — J329 Chronic sinusitis, unspecified: Secondary | ICD-10-CM | POA: Diagnosis not present

## 2016-10-02 DIAGNOSIS — G43009 Migraine without aura, not intractable, without status migrainosus: Secondary | ICD-10-CM | POA: Diagnosis not present

## 2016-10-02 DIAGNOSIS — I10 Essential (primary) hypertension: Secondary | ICD-10-CM | POA: Diagnosis not present

## 2016-10-02 DIAGNOSIS — R7301 Impaired fasting glucose: Secondary | ICD-10-CM

## 2016-10-02 DIAGNOSIS — Z5181 Encounter for therapeutic drug level monitoring: Secondary | ICD-10-CM | POA: Diagnosis not present

## 2016-10-02 HISTORY — DX: Chronic sinusitis, unspecified: J32.9

## 2016-10-02 LAB — LIPID PANEL
Cholesterol: 209 mg/dL — ABNORMAL HIGH (ref <200)
HDL: 43 mg/dL — ABNORMAL LOW (ref >50)
LDL CALC: 132 mg/dL — AB (ref <100)
TRIGLYCERIDES: 171 mg/dL — AB (ref <150)
Total CHOL/HDL Ratio: 4.9 Ratio (ref <5.0)
VLDL: 34 mg/dL — ABNORMAL HIGH (ref <30)

## 2016-10-02 LAB — COMPLETE METABOLIC PANEL WITH GFR
ALT: 40 U/L — ABNORMAL HIGH (ref 6–29)
AST: 27 U/L (ref 10–30)
Albumin: 4.2 g/dL (ref 3.6–5.1)
Alkaline Phosphatase: 50 U/L (ref 33–115)
BILIRUBIN TOTAL: 0.3 mg/dL (ref 0.2–1.2)
BUN: 15 mg/dL (ref 7–25)
CO2: 25 mmol/L (ref 20–31)
CREATININE: 0.56 mg/dL (ref 0.50–1.10)
Calcium: 10 mg/dL (ref 8.6–10.2)
Chloride: 103 mmol/L (ref 98–110)
GFR, Est Non African American: >89 mL/min (ref >=60)
GLUCOSE: 101 mg/dL — AB (ref 65–99)
Potassium: 4.6 mmol/L (ref 3.5–5.3)
Sodium: 139 mmol/L (ref 135–146)
TOTAL PROTEIN: 7.2 g/dL (ref 6.1–8.1)

## 2016-10-02 MED ORDER — AMOXICILLIN-POT CLAVULANATE 875-125 MG PO TABS: 1.0000 | ORAL_TABLET | Freq: Two times a day (BID) | ORAL | 0 refills | Status: AC

## 2016-10-02 NOTE — Assessment & Plan Note (Signed)
relpax working well; avoiding triggers

## 2016-10-02 NOTE — Patient Instructions (Addendum)
Try to limit saturated fats in your diet (bologna, hot dogs, barbeque, cheeseburgers, hamburgers, steak, bacon, sausage, cheese, etc.) and get more fresh fruits, vegetables, and whole grains Your goal blood pressure is less than 140 mmHg on top. Try to follow the DASH guidelines (DASH stands for Dietary Approaches to Stop Hypertension) Try to limit the sodium in your diet.  Ideally, consume less than 1.5 grams (less than 1,500mg ) per day. Do not add salt when cooking or at the table.  Check the sodium amount on labels when shopping, and choose items lower in sodium when given a choice. Avoid or limit foods that already contain a lot of sodium. Eat a diet rich in fruits and vegetables and whole grains. We'll get labs today and refer you to the ENT If you have not heard anything from my staff in a week about any orders/referrals/studies from today, please contact us here to follow-up [phone]

## 2016-10-02 NOTE — Assessment & Plan Note (Signed)
Check labs 

## 2016-10-02 NOTE — Assessment & Plan Note (Signed)
Refer to ENT; sounds like whatever is going on in her head is draining down into her chest; continue flonase

## 2016-10-02 NOTE — Assessment & Plan Note (Signed)
Check A1c; glad she is going see educator; avoid "whites" (sugar, rice, flour, etc)

## 2016-10-02 NOTE — Progress Notes (Signed)
BP 124/72   Pulse 93   Temp 97.8 F (36.6 C) (Oral)   Resp 16   Wt (!) 371 lb (168.3 kg)   LMP 09/19/2016   SpO2 94%   BMI 65.72 kg/m    Subjective:    Patient ID: Tanya Reeves, female    DOB: July 03, 1986, 30 y.o.   MRN: [id number]  HPI: Tanya Reeves is a 30 y.o. female  Chief Complaint  Patient presents with  . Follow-up   She has seen a pulmonologist about her breathing; she was told to take benadryl 50 mg TID and singulair Coughing up tons of nasty green mucous, just thinks it is nasal drainage that she is coughing back up Has pain in her face and pressure; hurts to chew, sinus symptoms for 2.5 weeks; she took two rounds of antibiotics and prednisone 15 weeks ago; got some better for a while, but still having breathing issues; right ear is backed up; she would get one sinus infection a year or so; tubes in ears as child  Blood rpessure is doing well  relpax controls her migraines well; she knows what her triggers are; no aura  Prediabetes; last A1c was 6.1 in April; will see diabetic educator; knows to stay away from the whites  Morbid obesity; saw about bariatric surgery; not covered by insurance  High TG, 229 in April; last checked in April; runs in the family; tried a keto diet for several months, ate a lot of healthy fats, but not sure if that affected it; tries to limit fried foods  She'll postpone flu shot for a few weeks  [facility] 2/9 08/06/2016 02/26/2016  Decreased Interest 0 0  Down, Depressed, Hopeless 0 0  PHQ - 2 Score 0 0   Relevant past medical, surgical, family and social history reviewed Past Medical History:  Diagnosis Date  . Asthma   . Chronic hip pain   . Hypertension   . Migraines   . Morbid obesity (HCC) 03/17/2016  . Sleep apnea    Past Surgical History:  Procedure Laterality Date  . CESAREAN SECTION     2  . CHOLECYSTECTOMY    . COLON SURGERY     part of bowel removed due to c-section   Family History    Problem Relation Age of Onset  . Diabetes Mother   . Cancer Mother     femal organs  . Hypoparathyroidism Mother   . Asthma Mother    Social History  Substance Use Topics  . Smoking status: Never Smoker  . Smokeless tobacco: Never Used  . Alcohol use 0.0 oz/week     Comment: wine    Interim medical history since last visit reviewed. Allergies and medications reviewed  Review of Systems Per HPI unless specifically indicated above     Objective:    BP 124/72   Pulse 93   Temp 97.8 F (36.6 C) (Oral)   Resp 16   Wt (!) 371 lb (168.3 kg)   LMP 09/19/2016   SpO2 94%   BMI 65.72 kg/m   Wt Readings from Last 3 Encounters:  10/02/16 (!) 371 lb (168.3 kg)  09/23/16 (!) 368 lb (166.9 kg)  08/26/16 (!) 374 lb (169.6 kg)    Physical Exam  Constitutional: She appears well-developed and well-nourished. No distress.  HENT:  Head: Normocephalic and atraumatic.  Right Ear: Tympanic membrane, external ear and ear canal normal. No drainage. Tympanic membrane is not erythematous. No middle ear effusion.  No decreased hearing is noted.  Left Ear: Hearing, external ear and ear canal normal. No drainage. Tympanic membrane is scarred. Tympanic membrane is not erythematous.  No middle ear effusion. No decreased hearing is noted.  Nose: Rhinorrhea present.  Mouth/Throat: Oropharynx is clear and moist and mucous membranes are normal. Mucous membranes are not dry.  Eyes: EOM are normal. No scleral icterus.  Neck: No thyromegaly present.  Cardiovascular: Normal rate, regular rhythm and normal heart sounds.   No murmur heard. Pulmonary/Chest: Effort normal and breath sounds normal. No respiratory distress. She has no wheezes.  Abdominal: Soft. She exhibits no distension.  large pannus  Musculoskeletal: Normal range of motion. She exhibits no edema.  Lymphadenopathy:    She has no cervical adenopathy.       Right: No supraclavicular adenopathy present.       Left: No supraclavicular  adenopathy present.  Neurological: She is alert. She exhibits normal muscle tone.  Skin: Skin is warm and dry. She is not diaphoretic. No pallor.  Psychiatric: She has a normal mood and affect. Her behavior is normal. Judgment and thought content normal. Her mood appears not anxious. She does not exhibit a depressed mood.   Results for orders placed or performed during the hospital encounter of 07/31/16  CBC with Differential  Result Value Ref Range   WBC 10.4 3.6 - 11.0 K/uL   RBC 4.75 3.80 - 5.20 MIL/uL   Hemoglobin 13.1 12.0 - 16.0 g/dL   HCT 40.939.5 81.135.0 - 91.447.0 %   MCV 83.2 80.0 - 100.0 fL   MCH 27.7 26.0 - 34.0 pg   MCHC 33.3 32.0 - 36.0 g/dL   RDW 78.214.9 (H) 95.611.5 - 21.314.5 %   Platelets 253 150 - 440 K/uL   Neutrophils Relative % 55 %   Neutro Abs 5.6 1.4 - 6.5 K/uL   Lymphocytes Relative 34 %   Lymphs Abs 3.6 1.0 - 3.6 K/uL   Monocytes Relative 7 %   Monocytes Absolute 0.8 0.2 - 0.9 K/uL   Eosinophils Relative 3 %   Eosinophils Absolute 0.3 0 - 0.7 K/uL   Basophils Relative 1 %   Basophils Absolute 0.1 0 - 0.1 K/uL  Basic metabolic panel  Result Value Ref Range   Sodium 135 135 - 145 mmol/L   Potassium 3.8 3.5 - 5.1 mmol/L   Chloride 102 101 - 111 mmol/L   CO2 25 22 - 32 mmol/L   Glucose, Bld 96 65 - 99 mg/dL   BUN 19 6 - 20 mg/dL   Creatinine, Ser 0.860.58 0.44 - 1.00 mg/dL   Calcium 9.4 8.9 - 57.810.3 mg/dL   GFR calc non Af Amer >60 >60 mL/min   GFR calc Af Amer >60 >60 mL/min   Anion gap 8 5 - 15  Fibrin derivatives D-Dimer  Result Value Ref Range   Fibrin derivatives D-dimer ([facility]) 220 0 - 499      Assessment & Plan:   Problem List Items Addressed This Visit      Cardiovascular and Mediastinum   Migraine without aura    relpax working well; avoiding triggers      Essential hypertension, benign    Good control; she is working on weight loss; try DASH guidelines        Respiratory   Chronic sinusitis - Primary    Refer to ENT; sounds like whatever is going on in  her head is draining down into her chest; continue flonase  Relevant Medications   diphenhydrAMINE (BENADRYL) 50 MG capsule   amoxicillin-clavulanate (AUGMENTIN) 875-125 MG tablet   Other Relevant Orders   Ambulatory referral to ENT     Endocrine   IFG (impaired fasting glucose)    Check A1c; glad she is going see educator; avoid "whites" (sugar, rice, flour, etc)      Relevant Orders   Hemoglobin A1c     Other   Medication monitoring encounter    Check labs      Relevant Orders   COMPLETE METABOLIC PANEL WITH GFR   High triglycerides    Likely affected by her obesity; check lipids today; limit fried foods, sugars      Relevant Orders   Lipid panel      Follow up plan: Return in about 6 months (around 04/01/2017) for fasting labs and visit.  An after-visit summary was printed and given to the patient at check-out.  Please see the patient instructions which may contain other information and recommendations beyond what is mentioned above in the assessment and plan.  Meds ordered this encounter  Medications  . ranitidine (ZANTAC) 150 MG tablet    Sig: Take 150 mg by mouth daily.  . diphenhydrAMINE (BENADRYL) 50 MG capsule    Sig: Take 50 mg by mouth every 6 (six) hours as needed.  Marland Kitchen amoxicillin-clavulanate (AUGMENTIN) 875-125 MG tablet    Sig: Take 1 tablet by mouth 2 (two) times daily.    Dispense:  28 tablet    Refill:  0    Orders Placed This Encounter  Procedures  . Hemoglobin A1c  . COMPLETE METABOLIC PANEL WITH GFR  . Lipid panel  . Ambulatory referral to ENT

## 2016-10-02 NOTE — Assessment & Plan Note (Signed)
Likely affected by her obesity; check lipids today; limit fried foods, sugars

## 2016-10-02 NOTE — Assessment & Plan Note (Signed)
Good control; she is working on weight loss; try DASH guidelines

## 2016-10-03 LAB — HEMOGLOBIN A1C
Hgb A1c MFr Bld: 6.1 % — ABNORMAL HIGH (ref <5.7)
MEAN PLASMA GLUCOSE: 128 mg/dL

## 2016-10-09 ENCOUNTER — Ambulatory Visit: Payer: 59 | Admitting: Dietician

## 2016-10-12 ENCOUNTER — Other Ambulatory Visit: Payer: Self-pay | Admitting: Family Medicine

## 2016-10-12 DIAGNOSIS — B379 Candidiasis, unspecified: Secondary | ICD-10-CM

## 2016-10-28 ENCOUNTER — Telehealth: Payer: Self-pay

## 2016-10-28 NOTE — Telephone Encounter (Signed)
Patient had an appt with [facility] today but since she works for [employer] and has the navigate plan an online referral must be made via their portal. I tried to do it but did not have access since I was still operating under the  [facility] Tax ID.   I applied for a new [employer] [facility] ID with our [facility] doctor's office tax idea but now has to wait until it is approved.  Patient and ENT office was informed of this process and I apologized to the patient for the inconvenience of her taking off and not being able to be seen due to lack of communication.

## 2016-11-08 ENCOUNTER — Other Ambulatory Visit: Payer: Self-pay | Admitting: Family Medicine

## 2016-11-08 DIAGNOSIS — B379 Candidiasis, unspecified: Secondary | ICD-10-CM

## 2017-02-13 ENCOUNTER — Other Ambulatory Visit: Payer: Self-pay | Admitting: Family Medicine

## 2017-02-13 DIAGNOSIS — I10 Essential (primary) hypertension: Secondary | ICD-10-CM

## 2017-02-13 NOTE — Telephone Encounter (Signed)
Last Cr and K+ reviewed; Rx approved 

## 2017-03-24 ENCOUNTER — Telehealth: Payer: Self-pay | Admitting: Family Medicine

## 2017-03-24 DIAGNOSIS — L732 Hidradenitis suppurativa: Secondary | ICD-10-CM | POA: Insufficient documentation

## 2017-03-24 NOTE — Telephone Encounter (Signed)
Referral entered  

## 2017-03-24 NOTE — Telephone Encounter (Signed)
Requesting a new referral to be sent to [facility] in [place]. She has been seen there before and is needing a new one due boils under the skin that is hereditary (hidradenitis suppurativa), which has to be lanced. Time for her next appointment.

## 2017-04-15 ENCOUNTER — Other Ambulatory Visit: Payer: Self-pay | Admitting: Family Medicine

## 2017-04-15 DIAGNOSIS — I10 Essential (primary) hypertension: Secondary | ICD-10-CM

## 2017-04-15 NOTE — Telephone Encounter (Signed)
Last Cr reviewed; Rx approved 

## 2017-05-21 ENCOUNTER — Other Ambulatory Visit: Payer: Self-pay | Admitting: Family Medicine

## 2017-05-21 DIAGNOSIS — G43009 Migraine without aura, not intractable, without status migrainosus: Secondary | ICD-10-CM

## 2017-07-10 ENCOUNTER — Telehealth: Payer: Self-pay

## 2017-07-10 ENCOUNTER — Other Ambulatory Visit: Payer: Self-pay | Admitting: Family Medicine

## 2017-07-10 DIAGNOSIS — I10 Essential (primary) hypertension: Secondary | ICD-10-CM

## 2017-07-10 NOTE — Telephone Encounter (Signed)
I got a call from [facility] with [facility] stating that they needed a referral for this patient's office visit (today).  Miel did an online prior auth via [facility] for today's office visit with Dr. Linus Salmons. It was approved and faxed to their office.  Referral ID #[id number] Number of Visits: 6 Referral Dates: 07/10/17 - 01/06/18  Dx code: J32.9 (Chronic Sinusitis, unspecified)

## 2017-07-11 NOTE — Telephone Encounter (Signed)
LMOM to inform pt °

## 2017-07-11 NOTE — Telephone Encounter (Signed)
Please ask pt to make an appt; last visit was Nov 2017 We'll get fasting labs too then Refill approved

## 2017-07-18 ENCOUNTER — Ambulatory Visit (INDEPENDENT_AMBULATORY_CARE_PROVIDER_SITE_OTHER): Payer: 59 | Admitting: Family Medicine

## 2017-07-18 ENCOUNTER — Encounter: Payer: Self-pay | Admitting: Family Medicine

## 2017-07-18 VITALS — BP 116/74 | HR 96 | Temp 98.0°F | Resp 16 | Wt 377.5 lb

## 2017-07-18 DIAGNOSIS — J4520 Mild intermittent asthma, uncomplicated: Secondary | ICD-10-CM | POA: Diagnosis not present

## 2017-07-18 DIAGNOSIS — N921 Excessive and frequent menstruation with irregular cycle: Secondary | ICD-10-CM | POA: Diagnosis not present

## 2017-07-18 DIAGNOSIS — Z5181 Encounter for therapeutic drug level monitoring: Secondary | ICD-10-CM

## 2017-07-18 DIAGNOSIS — R7301 Impaired fasting glucose: Secondary | ICD-10-CM

## 2017-07-18 DIAGNOSIS — I10 Essential (primary) hypertension: Secondary | ICD-10-CM | POA: Diagnosis not present

## 2017-07-18 DIAGNOSIS — Z23 Encounter for immunization: Secondary | ICD-10-CM

## 2017-07-18 DIAGNOSIS — E781 Pure hyperglyceridemia: Secondary | ICD-10-CM | POA: Diagnosis not present

## 2017-07-18 DIAGNOSIS — N920 Excessive and frequent menstruation with regular cycle: Secondary | ICD-10-CM | POA: Insufficient documentation

## 2017-07-18 DIAGNOSIS — G43009 Migraine without aura, not intractable, without status migrainosus: Secondary | ICD-10-CM

## 2017-07-18 DIAGNOSIS — J45909 Unspecified asthma, uncomplicated: Secondary | ICD-10-CM | POA: Insufficient documentation

## 2017-07-18 LAB — CBC WITH DIFFERENTIAL/PLATELET
BASOS PCT: 1 %
Basophils Absolute: 71 cells/uL (ref 0–200)
EOS PCT: 3 %
Eosinophils Absolute: 213 cells/uL (ref 15–500)
HCT: 38.3 % (ref 35.0–45.0)
Hemoglobin: 12.7 g/dL (ref 11.7–15.5)
LYMPHS PCT: 41 %
Lymphs Abs: 2911 cells/uL (ref 850–3900)
MCH: 27.6 pg (ref 27.0–33.0)
MCHC: 33.2 g/dL (ref 32.0–36.0)
MCV: 83.3 fL (ref 80.0–100.0)
MONOS PCT: 7 %
MPV: 9.9 fL (ref 7.5–12.5)
Monocytes Absolute: 497 cells/uL (ref 200–950)
NEUTROS PCT: 48 %
Neutro Abs: 3408 cells/uL (ref 1500–7800)
PLATELETS: 297 10*3/uL (ref 140–400)
RBC: 4.6 MIL/uL (ref 3.80–5.10)
RDW: 14.7 % (ref 11.0–15.0)
WBC: 7.1 10*3/uL (ref 3.8–10.8)

## 2017-07-18 NOTE — Assessment & Plan Note (Signed)
Mild intermittent; recommended pneumonia vaccine and flu shot

## 2017-07-18 NOTE — Assessment & Plan Note (Signed)
Increased frequency with recent stress; try stress reduction

## 2017-07-18 NOTE — Patient Instructions (Addendum)
Keep up the amazing job with your weight loss Try to limit salt We'll contact you with the labs drawn today You received the flu shot today; it should protect you against the flu virus over the coming months; it will take about two weeks for antibodies to develop; do try to stay away from hospitals, nursing homes, and daycares during peak flu season; taking extra vitamin C daily during flu season may help you avoid getting sick You have received the Pneumovax vaccine (PPSV-23) and you will not need another booster of this until after you turn 65 per current ACIP guidelines

## 2017-07-18 NOTE — Assessment & Plan Note (Signed)
Status post BTL; on ACE-I thiazide combo

## 2017-07-18 NOTE — Assessment & Plan Note (Signed)
She has lost significant weight loss, praise given

## 2017-07-18 NOTE — Progress Notes (Signed)
BP 116/74   Pulse 96   Temp 98 F (36.7 C) (Oral)   Resp 16   Wt (!) 377 lb 8 oz (171.2 kg)   LMP 07/10/2017   SpO2 96%   BMI 66.87 kg/m    Subjective:    Patient ID: Tanya Reeves, female    DOB: 09/02/1986, 31 y.o.   MRN: [id number]  HPI: Tanya Reeves is a 31 y.o. female  Chief Complaint  Patient presents with  . Medication Refill    HPI Here for follow-up  She saw Dr. Cheree Ditto for the hidradenitis suppurativa; not having any break-outs right now  Sees Dr. Jenne Campus for the fluid in her ears; not severe enough right now for tubes; nasal stuff has been good, better over the last two years  BP is good; taking the lisinopril-hctz; only gets fluid in the legs if standing a lot, a lot of activity  Migraines; having to take the relpax; lots of stress at job; high pressure projects; tried magnesium; tried 13 years working with the doctor to find something that worked; takes 6 a year now, but has had 2 a month these last 2 months; filled the generic the last time and it is working  Asthma; "doing really good" this year; triggered by pollen, changes of seasons; has about one bout a year with URI  Heavier periods than usual; some increased cramping; sees GYN; feeling tired; used to be regular, but then missed a period, then had two periods within two weeks of each other, trying to reboot; about 28 days, but a little early or late; tubes are tied  Morbid obesity; she is really work on this; she says she has lost 39 pounds; not doing any stimulants  Depression screen [facility] 2/9 07/18/2017 08/06/2016 02/26/2016  Decreased Interest 0 0 0  Down, Depressed, Hopeless 0 0 0  PHQ - 2 Score 0 0 0    Relevant past medical, surgical, family and social history reviewed Past Medical History:  Diagnosis Date  . Asthma   . Chronic hip pain   . Hypertension   . Migraines   . Morbid obesity (HCC) 03/17/2016  . Sleep apnea    Past Surgical History:  Procedure Laterality Date  .  CESAREAN SECTION     2  . CHOLECYSTECTOMY    . COLON SURGERY     part of bowel removed due to c-section   Family History  Problem Relation Age of Onset  . Diabetes Mother   . Cancer Mother        femal organs  . Hypoparathyroidism Mother   . Asthma Mother   . Leukemia Paternal Grandmother   . Aneurysm Paternal Grandfather    Social History   Social History  . Marital status: Married    Spouse name: N/A  . Number of children: N/A  . Years of education: N/A   Occupational History  . Not on file.   Social History Main Topics  . Smoking status: Never Smoker  . Smokeless tobacco: Never Used  . Alcohol use 0.0 oz/week     Comment: wine  . Drug use: No  . Sexual activity: Yes   Other Topics Concern  . Not on file   Social History Narrative  . No narrative on file    Interim medical history since last visit reviewed. Allergies and medications reviewed  Review of Systems  Constitutional: Negative for unexpected weight change.  Genitourinary:       Heavier periods  lately   Per HPI unless specifically indicated above     Objective:    BP 116/74   Pulse 96   Temp 98 F (36.7 C) (Oral)   Resp 16   Wt (!) 377 lb 8 oz (171.2 kg)   LMP 07/10/2017   SpO2 96%   BMI 66.87 kg/m   Wt Readings from Last 3 Encounters:  07/18/17 (!) 377 lb 8 oz (171.2 kg)  10/02/16 (!) 371 lb (168.3 kg)  09/23/16 (!) 368 lb (166.9 kg)    Physical Exam  Constitutional: She appears well-developed and well-nourished. No distress.  HENT:  Head: Normocephalic and atraumatic.  Right Ear: Tympanic membrane, external ear and ear canal normal. No drainage. Tympanic membrane is not erythematous. No middle ear effusion. No decreased hearing is noted.  Left Ear: Hearing, external ear and ear canal normal. No drainage. Tympanic membrane is scarred. Tympanic membrane is not erythematous.  No middle ear effusion. No decreased hearing is noted.  Nose: Rhinorrhea present.  Mouth/Throat: Oropharynx  is clear and moist and mucous membranes are normal. Mucous membranes are not dry.  Eyes: EOM are normal. No scleral icterus.  Neck: No thyromegaly present.  Cardiovascular: Normal rate, regular rhythm and normal heart sounds.   No murmur heard. Heart sounds distant due to body habitus  Pulmonary/Chest: Effort normal and breath sounds normal. No bradypnea (lung sounds distant due to body habitus). No respiratory distress. She has no wheezes.  Abdominal: Soft. She exhibits no distension.  large pannus  Musculoskeletal: Normal range of motion. She exhibits no edema.  Lymphadenopathy:    She has no cervical adenopathy.       Right: No supraclavicular adenopathy present.       Left: No supraclavicular adenopathy present.  Neurological: She is alert. She exhibits normal muscle tone.  Skin: Skin is warm and dry. She is not diaphoretic. No pallor.  Psychiatric: She has a normal mood and affect. Her behavior is normal. Judgment and thought content normal. Her mood appears not anxious. She does not exhibit a depressed mood.    Results for orders placed or performed in visit on 10/02/16  Hemoglobin A1c  Result Value Ref Range   Hgb A1c MFr Bld 6.1 (H) <5.7 %   Mean Plasma Glucose 128 mg/dL  COMPLETE METABOLIC PANEL WITH GFR  Result Value Ref Range   Sodium 139 135 - 146 mmol/L   Potassium 4.6 3.5 - 5.3 mmol/L   Chloride 103 98 - 110 mmol/L   CO2 25 20 - 31 mmol/L   Glucose, Bld 101 (H) 65 - 99 mg/dL   BUN 15 7 - 25 mg/dL   Creat 4.090.56 8.110.50 - 9.141.10 mg/dL   Total Bilirubin 0.3 0.2 - 1.2 mg/dL   Alkaline Phosphatase 50 33 - 115 U/L   AST 27 10 - 30 U/L   ALT 40 (H) 6 - 29 U/L   Total Protein 7.2 6.1 - 8.1 g/dL   Albumin 4.2 3.6 - 5.1 g/dL   Calcium 78.210.0 8.6 - 95.610.2 mg/dL   GFR, Est African American >89 >=60 mL/min   GFR, Est Non African American >89 >=60 mL/min  Lipid panel  Result Value Ref Range   Cholesterol 209 (H) <200 mg/dL   Triglycerides 213171 (H) <150 mg/dL   HDL 43 (L) >08>50 mg/dL     Total CHOL/HDL Ratio 4.9 <5.0 Ratio   VLDL 34 (H) <30 mg/dL   LDL Cholesterol 657132 (H) <100 mg/dL      Assessment &  Plan:   Problem List Items Addressed This Visit      Cardiovascular and Mediastinum   Migraine without aura    Increased frequency with recent stress; try stress reduction      Essential hypertension, benign    Status post BTL; on ACE-I thiazide combo        Respiratory   Asthma - Primary    Mild intermittent; recommended pneumonia vaccine and flu shot        Endocrine   IFG (impaired fasting glucose)    Check sugar and A1c      Relevant Orders   Hemoglobin A1c     Other   Morbid obesity (HCC)    She has lost significant weight loss, praise given      Medication monitoring encounter    Check liver and kidneys      Relevant Orders   COMPLETE METABOLIC PANEL WITH GFR   High triglycerides    Check lipids      Relevant Orders   Lipid panel   Heavy periods    Check CBC, ferritin, TSH; suggested getting back in to see GYN b/c possibility of fibroids      Relevant Orders   CBC with Differential/Platelet   TSH   Ferritin    Other Visit Diagnoses    Needs flu shot       Relevant Orders   Flu Vaccine QUAD 6+ mos PF IM (Fluarix Quad PF) (Completed)   Need for 23-polyvalent pneumococcal polysaccharide vaccine       Relevant Orders   Pneumococcal polysaccharide vaccine 23-valent greater than or equal to 2yo subcutaneous/IM (Completed)       Follow up plan: Return in about 6 months (around 01/15/2018) for twenty minute follow-up with fasting labs.  An after-visit summary was printed and given to the patient at check-out.  Please see the patient instructions which may contain other information and recommendations beyond what is mentioned above in the assessment and plan.  Meds ordered this encounter  Medications  . clindamycin (CLINDAGEL) 1 % gel    Sig: Apply 1 application topically daily as needed.    Orders Placed This Encounter   Procedures  . Flu Vaccine QUAD 6+ mos PF IM (Fluarix Quad PF)  . Pneumococcal polysaccharide vaccine 23-valent greater than or equal to 2yo subcutaneous/IM  . COMPLETE METABOLIC PANEL WITH GFR  . CBC with Differential/Platelet  . Hemoglobin A1c  . Lipid panel  . TSH  . Ferritin

## 2017-07-18 NOTE — Assessment & Plan Note (Signed)
Check liver and kidneys 

## 2017-07-18 NOTE — Assessment & Plan Note (Signed)
Check lipids 

## 2017-07-18 NOTE — Assessment & Plan Note (Signed)
Check sugar and A1c

## 2017-07-18 NOTE — Assessment & Plan Note (Addendum)
Check CBC, ferritin, TSH; suggested getting back in to see GYN b/c possibility of fibroids

## 2017-07-19 LAB — COMPLETE METABOLIC PANEL WITH GFR
ALBUMIN: 4.3 g/dL (ref 3.6–5.1)
ALK PHOS: 51 U/L (ref 33–115)
ALT: 44 U/L — AB (ref 6–29)
AST: 29 U/L (ref 10–30)
BUN: 11 mg/dL (ref 7–25)
CALCIUM: 9.5 mg/dL (ref 8.6–10.2)
CHLORIDE: 103 mmol/L (ref 98–110)
CO2: 20 mmol/L (ref 20–32)
CREATININE: 0.58 mg/dL (ref 0.50–1.10)
GFR, Est Non African American: >89 mL/min (ref >=60)
Glucose, Bld: 98 mg/dL (ref 65–99)
POTASSIUM: 4.2 mmol/L (ref 3.5–5.3)
Sodium: 138 mmol/L (ref 135–146)
Total Bilirubin: 0.3 mg/dL (ref 0.2–1.2)
Total Protein: 7.3 g/dL (ref 6.1–8.1)

## 2017-07-19 LAB — LIPID PANEL
CHOLESTEROL: 192 mg/dL (ref <200)
HDL: 41 mg/dL — ABNORMAL LOW (ref >50)
LDL Cholesterol: 112 mg/dL — ABNORMAL HIGH (ref <100)
Total CHOL/HDL Ratio: 4.7 Ratio (ref <5.0)
Triglycerides: 195 mg/dL — ABNORMAL HIGH (ref <150)
VLDL: 39 mg/dL — ABNORMAL HIGH (ref <30)

## 2017-07-19 LAB — FERRITIN: FERRITIN: 67 ng/mL (ref 10–154)

## 2017-07-19 LAB — HEMOGLOBIN A1C
HEMOGLOBIN A1C: 6.2 % — AB (ref <5.7)
MEAN PLASMA GLUCOSE: 131 mg/dL

## 2017-07-19 LAB — TSH: TSH: 2.31 m[IU]/L

## 2017-09-04 ENCOUNTER — Ambulatory Visit
Admission: RE | Admit: 2017-09-04 | Discharge: 2017-09-04 | Disposition: A | Discharge status: Home / Self Care | Payer: 59 | Source: Ambulatory Visit | Attending: Family Medicine | Admitting: Family Medicine

## 2017-09-04 ENCOUNTER — Ambulatory Visit (INDEPENDENT_AMBULATORY_CARE_PROVIDER_SITE_OTHER): Payer: 59 | Admitting: Family Medicine

## 2017-09-04 ENCOUNTER — Encounter: Payer: Self-pay | Admitting: Family Medicine

## 2017-09-04 VITALS — BP 138/72 | HR 90 | Temp 98.4°F | Ht 63.0 in | Wt 381.8 lb

## 2017-09-04 DIAGNOSIS — R05 Cough: Secondary | ICD-10-CM

## 2017-09-04 DIAGNOSIS — J40 Bronchitis, not specified as acute or chronic: Secondary | ICD-10-CM

## 2017-09-04 DIAGNOSIS — R059 Cough, unspecified: Secondary | ICD-10-CM

## 2017-09-04 IMAGING — CR DG CHEST 2V
1 series · 2 of 2 positions shown · non-contrast
Comparison: [DATE]

CLINICAL DATA: Dry cough for several weeks

EXAM:
CHEST  2 VIEW

[Series 1: dg chest 2 view · 0.14mm/px · 2 of 2 slices shown]
[im 1/2]
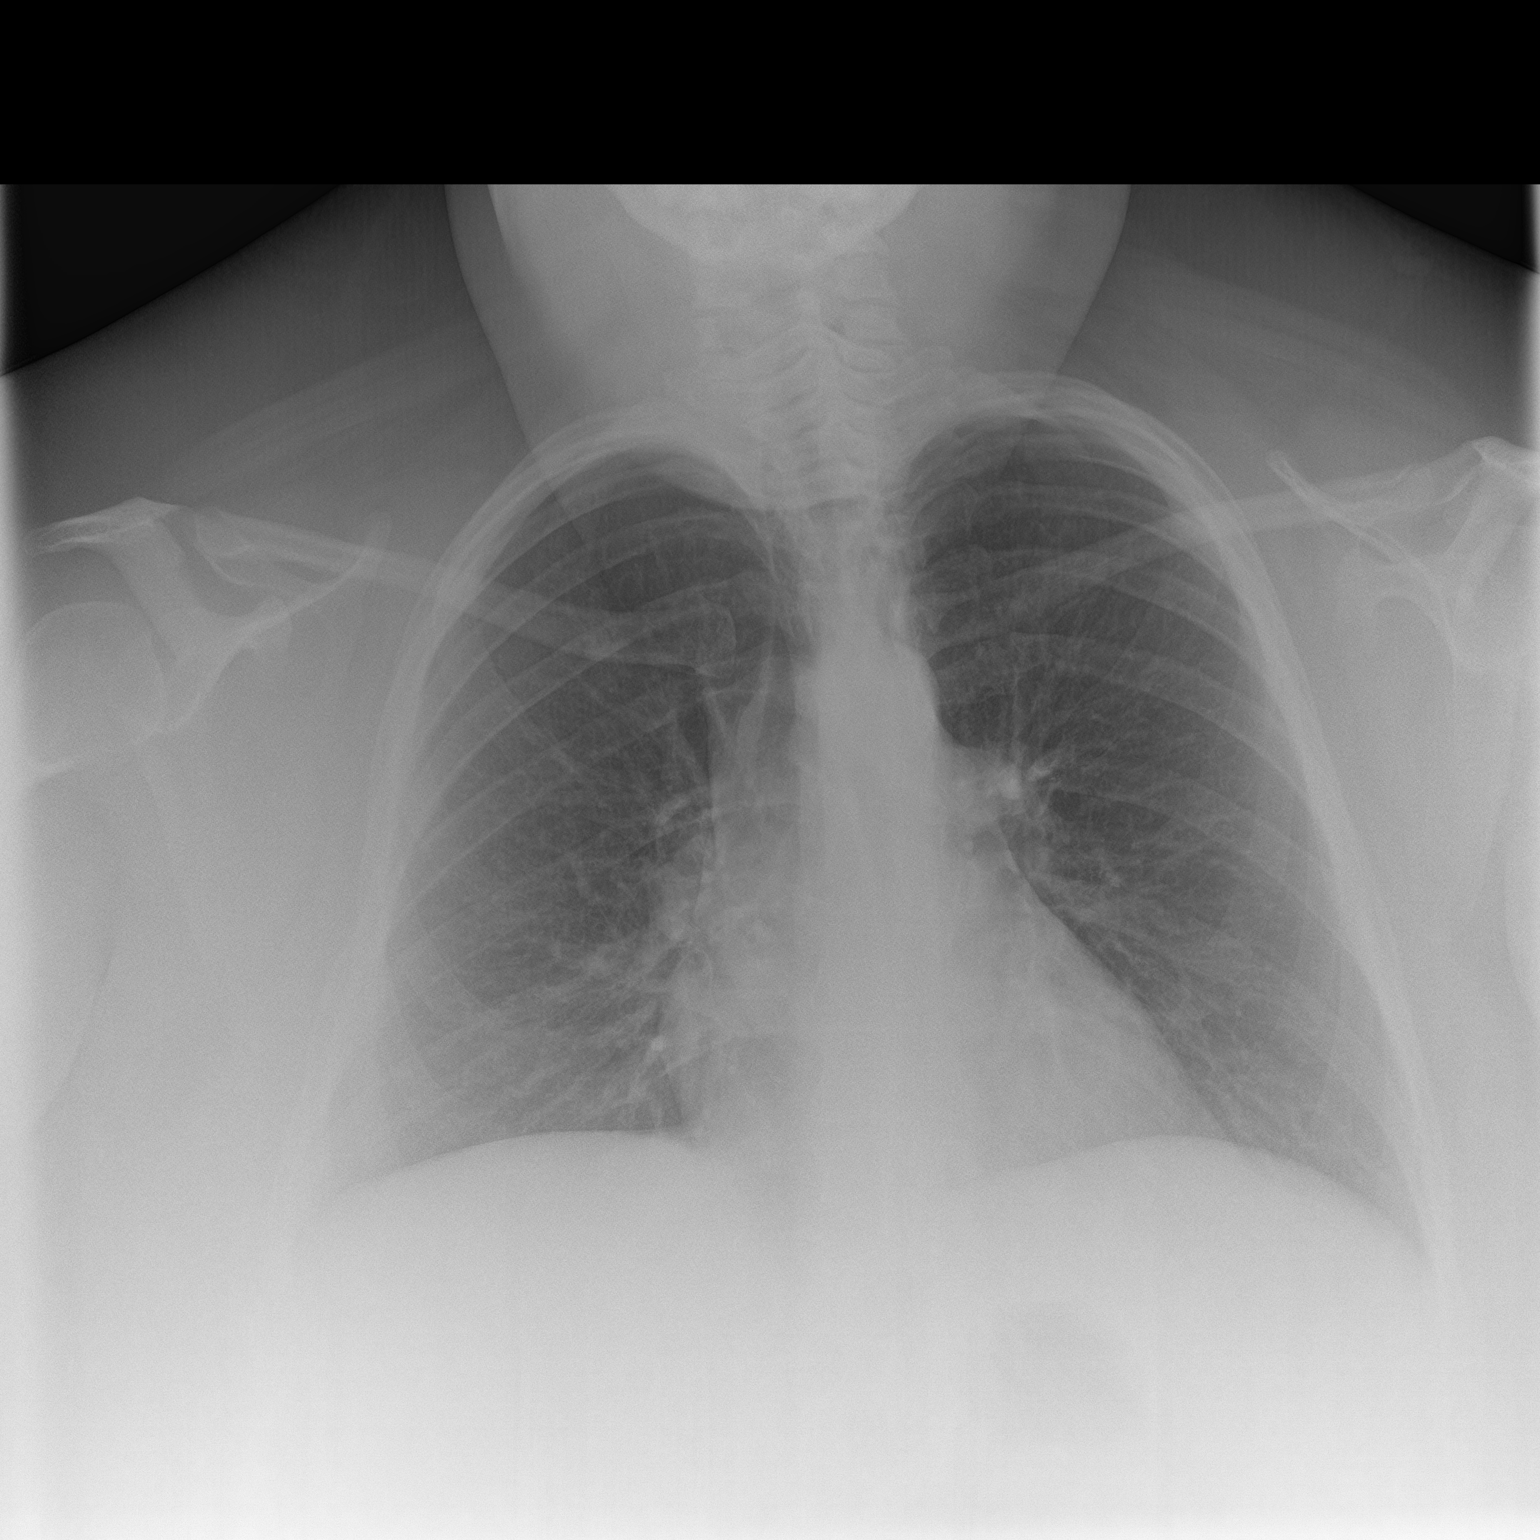
[im 2/2]
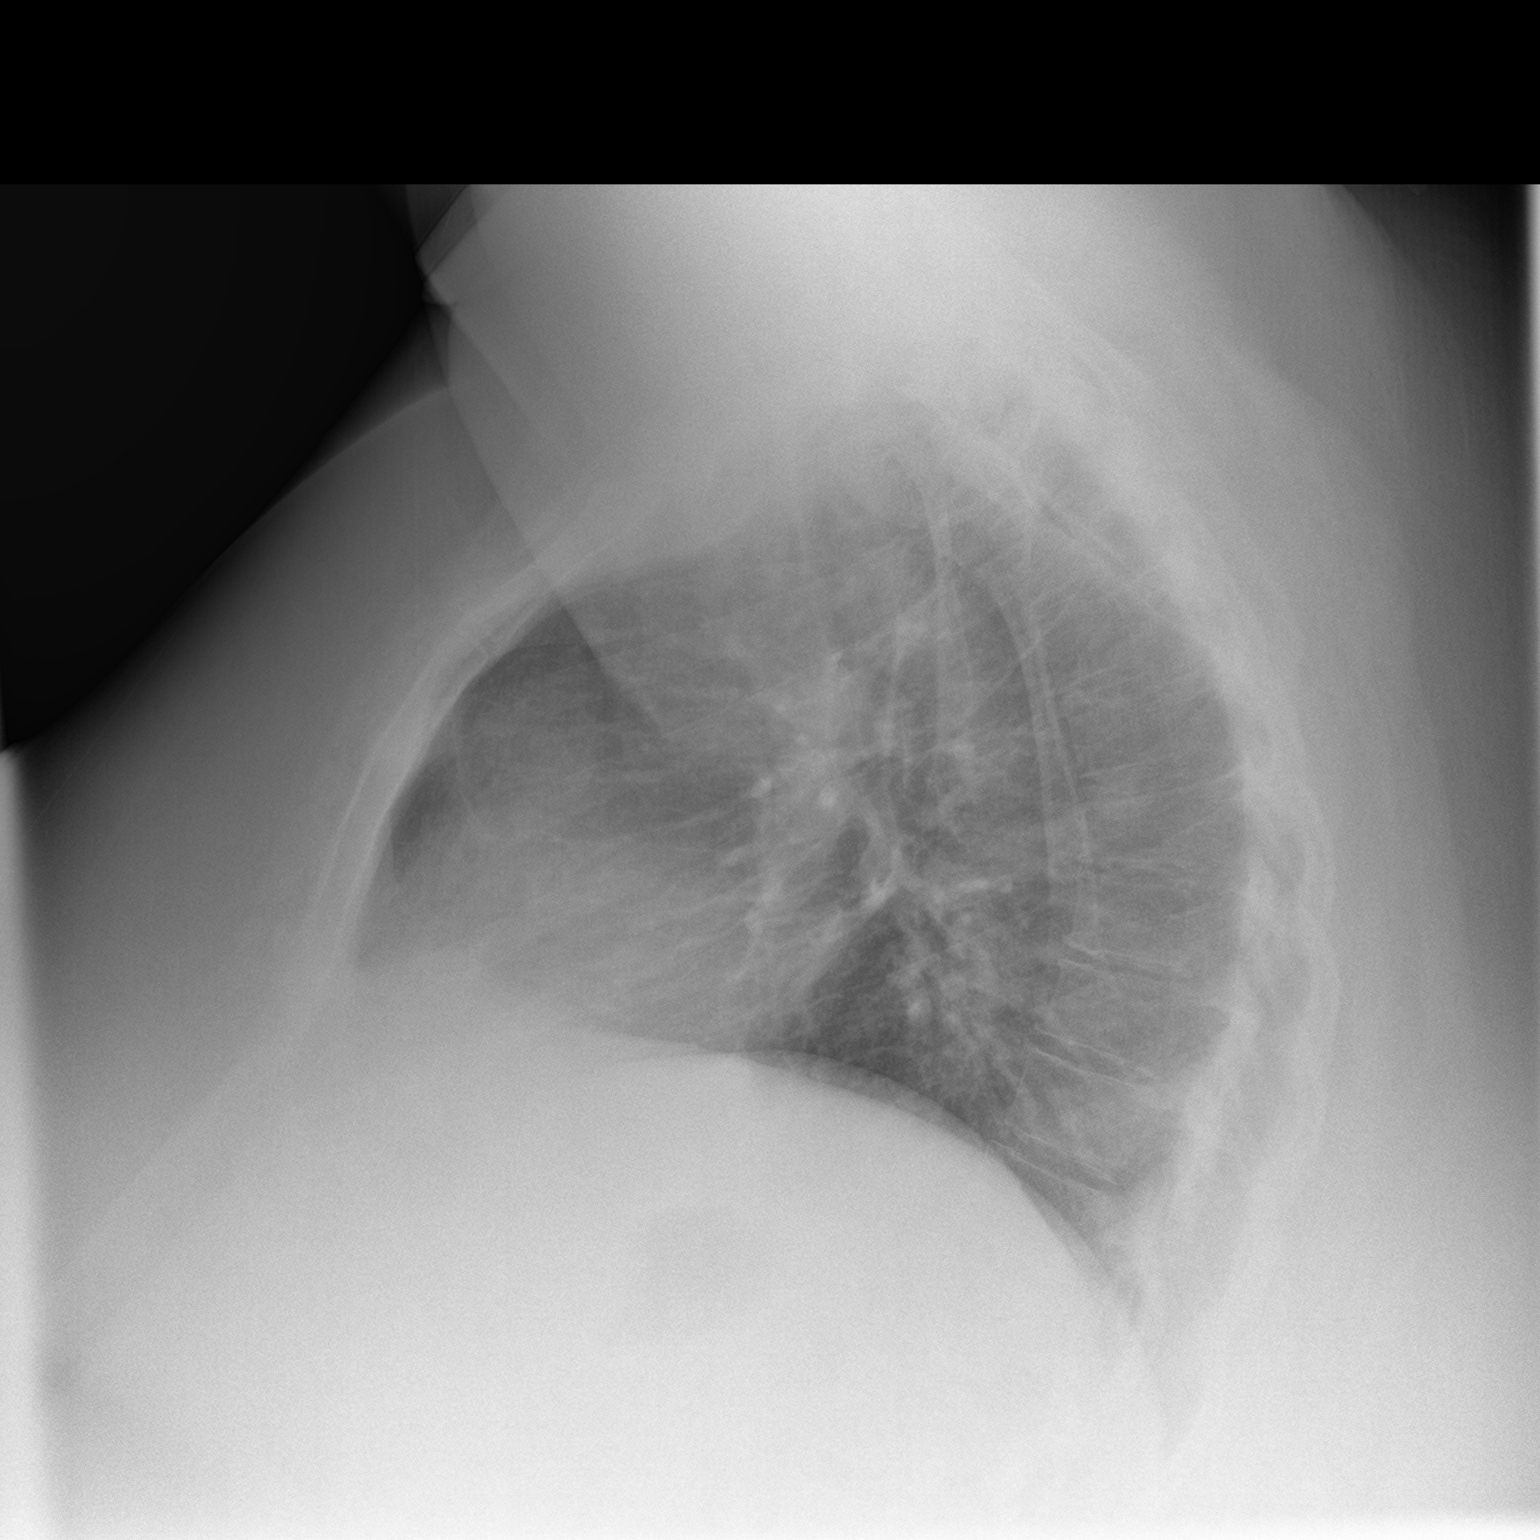

[2 of 2 positions shown; findings below may reference images not displayed]

FINDINGS: The heart size and mediastinal contours are within normal limits.
Both lungs are clear. The visualized skeletal structures are
unremarkable.
IMPRESSION: No active cardiopulmonary disease.

## 2017-09-04 MED ORDER — PREDNISONE 20 MG PO TABS: 20.0000 mg | ORAL_TABLET | Freq: Every day | ORAL | 0 refills | Status: DC

## 2017-09-04 MED ORDER — MONTELUKAST SODIUM 10 MG PO TABS
10.0000 mg | ORAL_TABLET | Freq: Every day | ORAL | 11 refills | Status: DC
Start: 2017-09-04 — End: 2018-02-02

## 2017-09-04 MED ORDER — ALBUTEROL SULFATE HFA 108 (90 BASE) MCG/ACT IN AERS: 2.0000 | INHALATION_SPRAY | RESPIRATORY_TRACT | 2 refills | Status: DC | PRN

## 2017-09-04 MED ORDER — LEVOFLOXACIN 500 MG PO TABS: 500.0000 mg | ORAL_TABLET | Freq: Every day | ORAL | 0 refills | Status: DC

## 2017-09-04 MED ORDER — HYDROCOD POLST-CPM POLST ER 10-8 MG/5ML PO SUER: 5.0000 mL | Freq: Two times a day (BID) | ORAL | 0 refills | Status: DC | PRN

## 2017-09-04 NOTE — Progress Notes (Signed)
BP 138/72 (BP Location: Left Arm, Patient Position: Sitting, Cuff Size: Large)   Pulse 90   Temp 98.4 F (36.9 C) (Oral)   Ht 5\' 3"  (1.6 m)   Wt (!) 381 lb 12.8 oz (173.2 kg)   LMP 08/09/2017   SpO2 94%   BMI 67.63 kg/m    Subjective:    Patient ID: Tanya Reeves, female    DOB: [birth year], 31 y.o.   MRN: [id number]  HPI: Tanya Reeves is a 31 y.o. female  Chief Complaint  Patient presents with  . Cough    x several weeks    HPI She is here for an acute visit Has done video visit for same problem already, not getting better First round of treatment flonase and prednisone Second round of treatment finished zithromax four days, no prednisone second time; flonase and tessalon perles Not allergic to tussionex Exhausted, can't sleep, constantly coughing Has chills and feels feverish Coughing for several weeks She has tried everything nebulizers Benadryl for allergy symptoms, worst allergy attack she has ever had Same thing every year this same time The cough is not this bad usually, happened just after last shot Really strange she says Watery eyes, constant drainage  Depression screen [facility] 2/9 09/04/2017 07/18/2017 08/06/2016 02/26/2016  Decreased Interest 0 0 0 0  Down, Depressed, Hopeless 0 0 0 0  PHQ - 2 Score 0 0 0 0    Relevant past medical, surgical, family and social history reviewed Past Medical History:  Diagnosis Date  . Asthma   . Chronic hip pain   . Hypertension   . Migraines   . Morbid obesity (HCC) 03/17/2016  . Sleep apnea    Past Surgical History:  Procedure Laterality Date  . CESAREAN SECTION     2  . CHOLECYSTECTOMY    . COLON SURGERY     part of bowel removed due to c-section   Family History  Problem Relation Age of Onset  . Diabetes Mother   . Cancer Mother        femal organs  . Hypoparathyroidism Mother   . Asthma Mother   . Leukemia Paternal Grandmother   . Aneurysm Paternal Grandfather    Social History   Social  History  . Marital status: Married    Spouse name: N/A  . Number of children: N/A  . Years of education: N/A   Occupational History  . Not on file.   Social History Main Topics  . Smoking status: Never Smoker  . Smokeless tobacco: Never Used  . Alcohol use 0.0 oz/week     Comment: wine  . Drug use: No  . Sexual activity: Yes   Other Topics Concern  . Not on file   Social History Narrative  . No narrative on file    Interim medical history since last visit reviewed. Allergies and medications reviewed  Review of Systems Per HPI unless specifically indicated above     Objective:    BP 138/72 (BP Location: Left Arm, Patient Position: Sitting, Cuff Size: Large)   Pulse 90   Temp 98.4 F (36.9 C) (Oral)   Ht 5\' 3"  (1.6 m)   Wt (!) 381 lb 12.8 oz (173.2 kg)   LMP 08/09/2017   SpO2 94%   BMI 67.63 kg/m   Wt Readings from Last 3 Encounters:  09/04/17 (!) 381 lb 12.8 oz (173.2 kg)  07/18/17 (!) 377 lb 8 oz (171.2 kg)  10/02/16 (!) 371 lb (168.3 kg)  MD note: recheck oxygen was 94% on room air with fingernail polish removed  Physical Exam  Constitutional: She appears well-developed and well-nourished. No distress.  Morbidly obese  HENT:  Right Ear: Ear canal normal.  Left Ear: Ear canal normal.  Nose: Rhinorrhea present.  Mouth/Throat: Oropharynx is clear and moist and mucous membranes are normal. No posterior oropharyngeal erythema.  Eyes: EOM are normal. No scleral icterus.  Cardiovascular: Normal rate and regular rhythm.   Pulmonary/Chest: Effort normal. No accessory muscle usage. No tachypnea. No respiratory distress. She has rhonchi.  Frequent cough, scattered rhonchi, no focal rhonchi; no respiratory distress  Lymphadenopathy:    She has no cervical adenopathy.  Skin: She is not diaphoretic. No pallor.  Psychiatric: She has a normal mood and affect. Her behavior is normal.      Assessment & Plan:   Problem List Items Addressed This Visit    None      Visit Diagnoses    Cough    -  Primary   will get chest xray; concern for atypical pneumonia given chronicity, presentation; will start levaquin; C diff risk discussed; prednisone, cough med   Bronchitis       will get CXR today   Relevant Orders   DG Chest 2 View (Completed)       Follow up plan: Return if symptoms worsen or fail to improve.  An after-visit summary was printed and given to the patient at check-out.  Please see the patient instructions which may contain other information and recommendations beyond what is mentioned above in the assessment and plan.  Meds ordered this encounter  Medications  . IRON PO    Sig: Take by mouth.  . DISCONTD: PROAIR HFA 108 (90 Base) MCG/ACT inhaler    Sig: INL 2 PFS PO Q 4 H PRN    Refill:  0  . DISCONTD: azithromycin (ZITHROMAX) 250 MG tablet    Sig: TK 2 TS PO TAY THEN TK 1 T PO D THEREAFTER    Refill:  0  . DISCONTD: benzonatate (TESSALON) 100 MG capsule    Sig: TK 2 CS PO TID UNTIL DIRECTED TO STOP    Refill:  0  . DISCONTD: predniSONE (DELTASONE) 10 MG tablet    Refill:  0  . albuterol (PROAIR HFA) 108 (90 Base) MCG/ACT inhaler    Sig: Inhale 2 puffs into the lungs every 4 (four) hours as needed.    Dispense:  1 Inhaler    Refill:  2  . montelukast (SINGULAIR) 10 MG tablet    Sig: Take 1 tablet (10 mg total) by mouth daily.    Dispense:  30 tablet    Refill:  11  . predniSONE (DELTASONE) 20 MG tablet    Sig: Take 1 tablet (20 mg total) by mouth daily with breakfast.    Dispense:  5 tablet    Refill:  0  . levofloxacin (LEVAQUIN) 500 MG tablet    Sig: Take 1 tablet (500 mg total) by mouth daily.    Dispense:  7 tablet    Refill:  0  . chlorpheniramine-HYDROcodone (TUSSIONEX PENNKINETIC ER) 10-8 MG/5ML SUER    Sig: Take 5 mLs by mouth every 12 (twelve) hours as needed for cough. Be cautious as this may cause somnolence    Dispense:  115 mL    Refill:  0    Orders Placed This Encounter  Procedures  . DG Chest 2  View

## 2017-09-04 NOTE — Patient Instructions (Addendum)
Start the antibiotic Please do eat yogurt daily or take a probiotic daily for the next month or two We want to replace the healthy germs in the gut If you notice foul, watery diarrhea in the next two months, schedule an appointment RIGHT AWAY Have the xray done across the street Use the cough medicine if needed, but be aware it may make you feel groggy or drunk or have an effect like an alcohol so do no drive for 12 hours afterwards if you are affected  Acute Bronchitis, Adult Acute bronchitis is sudden (acute) swelling of the air tubes (bronchi) in the lungs. Acute bronchitis causes these tubes to fill with mucus, which can make it hard to breathe. It can also cause coughing or wheezing. In adults, acute bronchitis usually goes away within 2 weeks. A cough caused by bronchitis may last up to 3 weeks. Smoking, allergies, and asthma can make the condition worse. Repeated episodes of bronchitis may cause further lung problems, such as chronic obstructive pulmonary disease (COPD). What are the causes? This condition can be caused by germs and by substances that irritate the lungs, including:  Cold and flu viruses. This condition is most often caused by the same virus that causes a cold.  Bacteria.  Exposure to tobacco smoke, dust, fumes, and air pollution.  What increases the risk? This condition is more likely to develop in people who:  Have close contact with someone with acute bronchitis.  Are exposed to lung irritants, such as tobacco smoke, dust, fumes, and vapors.  Have a weak immune system.  Have a respiratory condition such as asthma.  What are the signs or symptoms? Symptoms of this condition include:  A cough.  Coughing up clear, yellow, or green mucus.  Wheezing.  Chest congestion.  Shortness of breath.  A fever.  Body aches.  Chills.  A sore throat.  How is this diagnosed? This condition is usually diagnosed with a physical exam. During the exam, your  health care provider may order tests, such as chest X-rays, to rule out other conditions. He or she may also:  Test a sample of your mucus for bacterial infection.  Check the level of oxygen in your blood. This is done to check for pneumonia.  Do a chest X-ray or lung function testing to rule out pneumonia and other conditions.  Perform blood tests.  Your health care provider will also ask about your symptoms and medical history. How is this treated? Most cases of acute bronchitis clear up over time without treatment. Your health care provider may recommend:  Drinking more fluids. Drinking more makes your mucus thinner, which may make it easier to breathe.  Taking a medicine for a fever or cough.  Taking an antibiotic medicine.  Using an inhaler to help improve shortness of breath and to control a cough.  Using a cool mist vaporizer or humidifier to make it easier to breathe.  Follow these instructions at home: Medicines  Take over-the-counter and prescription medicines only as told by your health care provider.  If you were prescribed an antibiotic, take it as told by your health care provider. Do not stop taking the antibiotic even if you start to feel better. General instructions  Get plenty of rest.  Drink enough fluids to keep your urine clear or pale yellow.  Avoid smoking and secondhand smoke. Exposure to cigarette smoke or irritating chemicals will make bronchitis worse. If you smoke and you need help quitting, ask your health care provider.  Quitting smoking will help your lungs heal faster.  Use an inhaler, cool mist vaporizer, or humidifier as told by your health care provider.  Keep all follow-up visits as told by your health care provider. This is important. How is this prevented? To lower your risk of getting this condition again:  Wash your hands often with soap and water. If soap and water are not available, use hand sanitizer.  Avoid contact with people  who have cold symptoms.  Try not to touch your hands to your mouth, nose, or eyes.  Make sure to get the flu shot every year.  Contact a health care provider if:  Your symptoms do not improve in 2 weeks of treatment. Get help right away if:  You cough up blood.  You have chest pain.  You have severe shortness of breath.  You become dehydrated.  You faint or keep feeling like you are going to faint.  You keep vomiting.  You have a severe headache.  Your fever or chills gets worse. This information is not intended to replace advice given to you by your health care provider. Make sure you discuss any questions you have with your health care provider. Document Released: 12/12/2004 Document Revised: 05/29/2016 Document Reviewed: 04/24/2016 Elsevier Interactive Patient Education  2017 [employer].

## 2017-09-09 ENCOUNTER — Encounter: Payer: Self-pay | Admitting: Family Medicine

## 2017-10-09 ENCOUNTER — Other Ambulatory Visit: Payer: Self-pay | Admitting: Family Medicine

## 2017-10-09 DIAGNOSIS — I10 Essential (primary) hypertension: Secondary | ICD-10-CM

## 2017-10-29 ENCOUNTER — Other Ambulatory Visit: Payer: Self-pay | Admitting: Family Medicine

## 2017-10-29 DIAGNOSIS — G43009 Migraine without aura, not intractable, without status migrainosus: Secondary | ICD-10-CM

## 2018-01-15 ENCOUNTER — Ambulatory Visit: Payer: 59 | Admitting: Family Medicine

## 2018-02-02 ENCOUNTER — Encounter: Payer: Self-pay | Admitting: Family Medicine

## 2018-02-02 ENCOUNTER — Ambulatory Visit (INDEPENDENT_AMBULATORY_CARE_PROVIDER_SITE_OTHER): Payer: 59 | Admitting: Family Medicine

## 2018-02-02 VITALS — BP 126/70 | HR 94 | Temp 97.8°F | Ht 63.0 in | Wt 389.9 lb

## 2018-02-02 DIAGNOSIS — R7301 Impaired fasting glucose: Secondary | ICD-10-CM

## 2018-02-02 DIAGNOSIS — K219 Gastro-esophageal reflux disease without esophagitis: Secondary | ICD-10-CM | POA: Diagnosis not present

## 2018-02-02 DIAGNOSIS — Z5181 Encounter for therapeutic drug level monitoring: Secondary | ICD-10-CM | POA: Diagnosis not present

## 2018-02-02 DIAGNOSIS — B379 Candidiasis, unspecified: Secondary | ICD-10-CM | POA: Diagnosis not present

## 2018-02-02 DIAGNOSIS — E781 Pure hyperglyceridemia: Secondary | ICD-10-CM

## 2018-02-02 DIAGNOSIS — R110 Nausea: Secondary | ICD-10-CM | POA: Diagnosis not present

## 2018-02-02 DIAGNOSIS — I10 Essential (primary) hypertension: Secondary | ICD-10-CM

## 2018-02-02 DIAGNOSIS — L732 Hidradenitis suppurativa: Secondary | ICD-10-CM

## 2018-02-02 MED ORDER — NYSTATIN 100000 UNIT/GM EX CREA: TOPICAL_CREAM | CUTANEOUS | 3 refills | Status: DC

## 2018-02-02 MED ORDER — RANITIDINE HCL 150 MG PO TABS: 300.0000 mg | ORAL_TABLET | Freq: Every evening | ORAL | Status: DC | PRN

## 2018-02-02 NOTE — Assessment & Plan Note (Signed)
Will refill the nystatin to help prevent breakdown of skin

## 2018-02-02 NOTE — Assessment & Plan Note (Signed)
Check lipids today 

## 2018-02-02 NOTE — Assessment & Plan Note (Signed)
Check A1c today; last PO intake about 90 minutes before chicken nuggets

## 2018-02-02 NOTE — Assessment & Plan Note (Signed)
Check for H pylori;

## 2018-02-02 NOTE — Assessment & Plan Note (Signed)
Refill nystatin. 

## 2018-02-02 NOTE — Assessment & Plan Note (Signed)
Check labs 

## 2018-02-02 NOTE — Patient Instructions (Addendum)
Try to follow the DASH guidelines (DASH stands for Dietary Approaches to Stop Hypertension). Try to limit the sodium in your diet to no more than 1,500mg  of sodium per day. Certainly try to not exceed 2,000 mg per day at the very most. Do not add salt when cooking or at the table.  Check the sodium amount on labels when shopping, and choose items lower in sodium when given a choice. Avoid or limit foods that already contain a lot of sodium. Eat a diet rich in fruits and vegetables and whole grains, and try to lose weight if overweight or obese Try to limit saturated fats in your diet (bologna, hot dogs, barbeque, cheeseburgers, hamburgers, steak, bacon, sausage, cheese, etc.) and get more fresh fruits, vegetables, and whole grains Check out the information at familydoctor.org entitled "Nutrition for Weight Loss: What You Need to Know about Fad Diets" Try to lose between 1-2 pounds per week by taking in fewer calories and burning off more calories You can succeed by limiting portions, limiting foods dense in calories and fat, becoming more active, and drinking 8 glasses of water a day (64 ounces) Don't skip meals, especially breakfast, as skipping meals may alter your metabolism Do not use over-the-counter weight loss pills or gimmicks that claim rapid weight loss A healthy BMI (or body mass index) is between 18.5 and 24.9 You can calculate your ideal BMI at the NIH website [employer].huhttp://www.nhlbi.nih.gov/health/educational/lose_wt/BMI/bmicalc.htm If you have not heard anything from my staff in a week about any orders/referrals/studies from today, please contact us here to follow-up [phone] Read about Belviq (pill), Contrave (pill), Saxenda (subcu injection) and see if any of these are covered

## 2018-02-02 NOTE — Progress Notes (Signed)
BP 126/70 (BP Location: Left Arm, Patient Position: Sitting, Cuff Size: Large)   Pulse 94   Temp 97.8 F (36.6 C) (Oral)   Ht _0  (1.6 m)   Wt (!) 389 lb 14.4 oz (176.9 kg)   LMP 01/12/2018   SpO2 98%   BMI 69.07 kg/m    Subjective:    Patient ID: Tanya Reeves, female    DOB: [birth year], 32 y.o.   MRN: [id number]  HPI: Tanya Reeves is a 32 y.o. female  Chief Complaint  Patient presents with  . Follow-up  . Gastroesophageal Reflux    HPI Patient is here for f/u We reviewed her chart; she had gestational diabetes, but has not had type 2 DM as far as she knows I reviewed her A1c and glucose readings on the chart and can only find prediabetes level readings  GERD; acid burps; for 3-4 days; goes and comes; going on for 6 weeks; using OTC treatment; no blood in the stool; had the stomach bug and had diarrhea for 2 weeks, then took a month to regular back; normal stool caliber and consistency; some nausea, increased in severity over the last 3-4 weeks; short lived, feels like she is going to throw up; not that often; new; not related to eating in any way that she can see; more low carb and higher protein diet, but not really keto diet; trying to make better choices; s/p choly; no fevers; no belly pain; having bloating, but not new since her choly  Stress; daughter diagnosed with JRA; going to [place]; 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 role at work  Hidradenitis; needs refill of nystatin to prevent breakdown  Struggling with her weight; goes all day without eating sometimes until she gets home; skips lunch a lot of times; skips breakfast some too; nutritional consult was rejected; I asked about medicines for weight loss; just did it for two weeks in high school; didn't really do anything; tried OTC supplements   Depression screen [facility] 2/9 02/02/2018 09/04/2017 07/18/2017 08/06/2016 02/26/2016  Decreased Interest 0 0 0 0 0  Down, Depressed, Hopeless 0 0 0 0 0  PHQ - 2 Score 0 0 0 0 0     Relevant past medical, surgical, family and social history reviewed Past Medical History:  Diagnosis Date  . Asthma   . Chronic hip pain   . Hypertension   . Migraines   . Morbid obesity ([place]) 03/17/2016  . Sleep apnea    Past Surgical History:  Procedure Laterality Date  . CESAREAN SECTION     2  . CHOLECYSTECTOMY    . COLON SURGERY     part of bowel removed due to c-section   Family History  Problem Relation Age of Onset  . Diabetes Mother   . Cancer Mother        femal organs  . Hypoparathyroidism Mother   . Asthma Mother   . Juvenile idiopathic arthritis Daughter   . Leukemia Paternal Grandmother   . Aneurysm Paternal Grandfather    Social History   Tobacco Use  . Smoking status: Never Smoker  . Smokeless tobacco: Never Used  Substance Use Topics  . Alcohol use: Yes    Alcohol/week: 0.0 oz    Comment: wine  . Drug use: No    Interim medical history since last visit reviewed. Allergies and medications reviewed  Review of Systems Per HPI unless specifically indicated above     Objective:    BP 126/70 (BP Location: Left  Arm, Patient Position: Sitting, Cuff Size: Large)   Pulse 94   Temp 97.8 F (36.6 C) (Oral)   Ht _0  (1.6 m)   Wt (!) 389 lb 14.4 oz (176.9 kg)   LMP 01/12/2018   SpO2 98%   BMI 69.07 kg/m   Wt Readings from Last 3 Encounters:  02/02/18 (!) 389 lb 14.4 oz (176.9 kg)  09/04/17 (!) 381 lb 12.8 oz (173.2 kg)  07/18/17 (!) 377 lb 8 oz (171.2 kg)    Physical Exam  Constitutional: She appears well-developed and well-nourished. No distress.  HENT:  Head: Normocephalic and atraumatic.  Right Ear: External ear normal.  Left Ear: External ear normal.  Nose: No rhinorrhea.  Mouth/Throat: Mucous membranes are normal.  Eyes: EOM are normal. No scleral icterus.  Neck: No thyromegaly present.  Cardiovascular: Normal rate, regular rhythm and normal heart sounds.  No murmur heard. Heart sounds distant due to body habitus   Pulmonary/Chest: Effort normal and breath sounds normal. No bradypnea (lung sounds distant due to body habitus). No respiratory distress. She has no wheezes.  Abdominal: Soft. She exhibits no distension. There is no tenderness.  large pannus  Musculoskeletal: She exhibits no edema.  Lymphadenopathy:    She has no cervical adenopathy.  Neurological: She is alert.  Skin: She is not diaphoretic. No pallor.  Psychiatric: She has a normal mood and affect. Her behavior is normal. Judgment and thought content normal. Her mood appears not anxious. She does not exhibit a depressed mood.   Diabetic Foot Form - Detailed   Diabetic Foot Exam - detailed Diabetic Foot exam was performed with the following findings:  Yes 02/02/2018  2:06 PM  Visual Foot Exam completed.:  Yes  Pulse Foot Exam completed.:  Yes  Right Dorsalis Pedis:  Present Left Dorsalis Pedis:  Present  Sensory Foot Exam Completed.:  Yes Semmes-Weinstein Monofilament Test R Site 1-Great Toe:  Pos L Site 1-Great Toe:  Pos        Results for orders placed or performed in visit on 07/18/17  COMPLETE METABOLIC PANEL WITH GFR  Result Value Ref Range   Sodium 138 135 - 146 mmol/L   Potassium 4.2 3.5 - 5.3 mmol/L   Chloride 103 98 - 110 mmol/L   CO2 20 20 - 32 mmol/L   Glucose, Bld 98 65 - 99 mg/dL   BUN 11 7 - 25 mg/dL   Creat 0.58 0.50 - 1.10 mg/dL   Total Bilirubin 0.3 0.2 - 1.2 mg/dL   Alkaline Phosphatase 51 33 - 115 U/L   AST 29 10 - 30 U/L   ALT 44 (H) 6 - 29 U/L   Total Protein 7.3 6.1 - 8.1 g/dL   Albumin 4.3 3.6 - 5.1 g/dL   Calcium 9.5 8.6 - 10.2 mg/dL   GFR, Est African American >89 >=60 mL/min   GFR, Est Non African American >89 >=60 mL/min  CBC with Differential/Platelet  Result Value Ref Range   WBC 7.1 3.8 - 10.8 K/uL   RBC 4.60 3.80 - 5.10 MIL/uL   Hemoglobin 12.7 11.7 - 15.5 g/dL   HCT 38.3 35.0 - 45.0 %   MCV 83.3 80.0 - 100.0 fL   MCH 27.6 27.0 - 33.0 pg   MCHC 33.2 32.0 - 36.0 g/dL   RDW 14.7 11.0 -  15.0 %   Platelets 297 140 - 400 K/uL   MPV 9.9 7.5 - 12.5 fL   Neutro Abs 3,408 1,500 - 7,800 cells/uL  Lymphs Abs 2,911 850 - 3,900 cells/uL   Monocytes Absolute 497 200 - 950 cells/uL   Eosinophils Absolute 213 15 - 500 cells/uL   Basophils Absolute 71 0 - 200 cells/uL   Neutrophils Relative % 48 %   Lymphocytes Relative 41 %   Monocytes Relative 7 %   Eosinophils Relative 3 %   Basophils Relative 1 %   Smear Review Criteria for review not met   Hemoglobin A1c  Result Value Ref Range   Hgb A1c MFr Bld 6.2 (H) <5.7 %   Mean Plasma Glucose 131 mg/dL  Lipid panel  Result Value Ref Range   Cholesterol 192 <200 mg/dL   Triglycerides 195 (H) <150 mg/dL   HDL 41 (L) >50 mg/dL   Total CHOL/HDL Ratio 4.7 <5.0 Ratio   VLDL 39 (H) <30 mg/dL   LDL Cholesterol 112 (H) <100 mg/dL  TSH  Result Value Ref Range   TSH 2.31 mIU/L  Ferritin  Result Value Ref Range   Ferritin 67 10 - 154 ng/mL      Assessment & Plan:   Problem List Items Addressed This Visit      Cardiovascular and Mediastinum   Essential hypertension, benign    Well-controlled despite her stress        Endocrine   IFG (impaired fasting glucose)    Check A1c today; last PO intake about 90 minutes before chicken nuggets      Relevant Orders   Hemoglobin A1c     Musculoskeletal and Integument   Hidradenitis suppurativa    Will refill the nystatin to help prevent breakdown of skin      Relevant Medications   nystatin cream (MYCOSTATIN)     Other   Morbid obesity (HCC)   Relevant Orders   TSH   Nausea    Check for H pylori;       Relevant Orders   Helicobacter pylori special antigen   Medication monitoring encounter    Check labs      Relevant Orders   Comprehensive metabolic panel   CBC with Differential/Platelet   High triglycerides    Check lipids today      Relevant Orders   Lipid panel   Candidiasis    Refill nystatin      Relevant Medications   nystatin cream (MYCOSTATIN)      Other Visit Diagnoses    Gastroesophageal reflux disease, esophagitis presence not specified    -  Primary   Relevant Medications   ranitidine (ZANTAC) 150 MG tablet   Other Relevant Orders   Helicobacter pylori special antigen       Follow up plan: Return in about 3 weeks (around 02/23/2018) for follow-up visit with Dr. Sanda Klein.  An after-visit summary was printed and given to the patient at [facility].  Please see the patient instructions which may contain other information and recommendations beyond what is mentioned above in the assessment and plan.  Meds ordered this encounter  Medications  . ranitidine (ZANTAC) 150 MG tablet    Sig: Take 2 tablets (300 mg total) by mouth at bedtime as needed.  . nystatin cream (MYCOSTATIN)    Sig: APPLY EXTERNALLY TO THE AFFECTED AREA TWICE DAILY AS NEEDED    Dispense:  30 g    Refill:  3    Orders Placed This Encounter  Procedures  . Helicobacter pylori special antigen  . Comprehensive metabolic panel  . CBC with Differential/Platelet  . Hemoglobin A1c  . Lipid panel  .  TSH

## 2018-02-02 NOTE — Assessment & Plan Note (Signed)
Well-controlled despite her stress

## 2018-02-03 LAB — HEMOGLOBIN A1C
HEMOGLOBIN A1C: 6.6 %{Hb} — AB (ref <5.7)
Mean Plasma Glucose: 143 (calc)
eAG (mmol/L): 7.9 (calc)

## 2018-02-03 LAB — CBC WITH DIFFERENTIAL/PLATELET
BASOS ABS: 59 {cells}/uL (ref 0–200)
BASOS PCT: 0.9 %
EOS PCT: 4 %
Eosinophils Absolute: 264 cells/uL (ref 15–500)
HEMATOCRIT: 38.3 % (ref 35.0–45.0)
HEMOGLOBIN: 12.8 g/dL (ref 11.7–15.5)
LYMPHS ABS: 2534 {cells}/uL (ref 850–3900)
MCH: 27.6 pg (ref 27.0–33.0)
MCHC: 33.4 g/dL (ref 32.0–36.0)
MCV: 82.7 fL (ref 80.0–100.0)
MPV: 11 fL (ref 7.5–12.5)
Monocytes Relative: 4.9 %
NEUTROS ABS: 3419 {cells}/uL (ref 1500–7800)
Neutrophils Relative %: 51.8 %
Platelets: 259 10*3/uL (ref 140–400)
RBC: 4.63 10*6/uL (ref 3.80–5.10)
RDW: 14.2 % (ref 11.0–15.0)
Total Lymphocyte: 38.4 %
WBC mixed population: 323 cells/uL (ref 200–950)
WBC: 6.6 10*3/uL (ref 3.8–10.8)

## 2018-02-03 LAB — COMPREHENSIVE METABOLIC PANEL
AG RATIO: 1.4 (calc) (ref 1.0–2.5)
ALT: 55 U/L — ABNORMAL HIGH (ref 6–29)
AST: 41 U/L — AB (ref 10–30)
Albumin: 4.3 g/dL (ref 3.6–5.1)
Alkaline phosphatase (APISO): 43 U/L (ref 33–115)
BILIRUBIN TOTAL: 0.4 mg/dL (ref 0.2–1.2)
BUN: 12 mg/dL (ref 7–25)
CALCIUM: 9.7 mg/dL (ref 8.6–10.2)
CHLORIDE: 99 mmol/L (ref 98–110)
CO2: 29 mmol/L (ref 20–32)
Creat: 0.67 mg/dL (ref 0.50–1.10)
GLOBULIN: 3.1 g/dL (ref 1.9–3.7)
GLUCOSE: 174 mg/dL — AB (ref 65–139)
Potassium: 3.4 mmol/L — ABNORMAL LOW (ref 3.5–5.3)
SODIUM: 137 mmol/L (ref 135–146)
TOTAL PROTEIN: 7.4 g/dL (ref 6.1–8.1)

## 2018-02-03 LAB — TSH: TSH: 1.69 mIU/L

## 2018-02-03 LAB — LIPID PANEL
CHOL/HDL RATIO: 5 (calc) — AB (ref <5.0)
Cholesterol: 196 mg/dL (ref <200)
HDL: 39 mg/dL — AB (ref >50)
LDL CHOLESTEROL (CALC): 123 mg/dL — AB
NON-HDL CHOLESTEROL (CALC): 157 mg/dL — AB (ref <130)
TRIGLYCERIDES: 224 mg/dL — AB (ref <150)

## 2018-02-05 ENCOUNTER — Encounter: Payer: Self-pay | Admitting: Family Medicine

## 2018-02-05 LAB — HELICOBACTER PYLORI  SPECIAL ANTIGEN
MICRO NUMBER:: 90351432
SPECIMEN QUALITY: ADEQUATE

## 2018-02-27 ENCOUNTER — Ambulatory Visit: Payer: 59 | Admitting: Family Medicine

## 2018-03-30 ENCOUNTER — Encounter: Payer: Self-pay | Admitting: Family Medicine

## 2018-04-02 ENCOUNTER — Telehealth: Payer: Self-pay

## 2018-04-02 NOTE — Telephone Encounter (Signed)
Copied from CRM [id number]. Topic: Referral - Question >> Apr 02, 2018  9:31 AM Raquel Sarna wrote: [facility] [phone]  ext [phone]  Needing to discuss pt's referral to them.  Please call her back.  I returned their call and was informed that Dr. Alva Garnet is no longer taking any new patients so they needed the referral to say Dr. Primus Bravo. Referral has been updated and re-faxed.

## 2018-04-09 ENCOUNTER — Encounter: Payer: Self-pay | Admitting: Family Medicine

## 2018-04-09 ENCOUNTER — Other Ambulatory Visit: Payer: Self-pay | Admitting: Family Medicine

## 2018-04-09 ENCOUNTER — Ambulatory Visit (INDEPENDENT_AMBULATORY_CARE_PROVIDER_SITE_OTHER): Payer: 59 | Admitting: Family Medicine

## 2018-04-09 VITALS — BP 126/86 | HR 101 | Temp 98.3°F | Resp 18 | Ht 63.0 in | Wt 389.6 lb

## 2018-04-09 DIAGNOSIS — J301 Allergic rhinitis due to pollen: Secondary | ICD-10-CM

## 2018-04-09 DIAGNOSIS — I10 Essential (primary) hypertension: Secondary | ICD-10-CM

## 2018-04-09 DIAGNOSIS — E1169 Type 2 diabetes mellitus with other specified complication: Secondary | ICD-10-CM | POA: Diagnosis not present

## 2018-04-09 DIAGNOSIS — E669 Obesity, unspecified: Secondary | ICD-10-CM

## 2018-04-09 DIAGNOSIS — J4541 Moderate persistent asthma with (acute) exacerbation: Secondary | ICD-10-CM

## 2018-04-09 DIAGNOSIS — E1165 Type 2 diabetes mellitus with hyperglycemia: Secondary | ICD-10-CM | POA: Diagnosis not present

## 2018-04-09 LAB — POCT GLYCOSYLATED HEMOGLOBIN (HGB A1C): Hemoglobin A1C: 6.9 % — AB (ref 4.0–5.6)

## 2018-04-09 MED ORDER — HYDROCOD POLST-CPM POLST ER 10-8 MG/5ML PO SUER: 5.0000 mL | Freq: Two times a day (BID) | ORAL | 0 refills | Status: DC | PRN

## 2018-04-09 MED ORDER — BUDESONIDE-FORMOTEROL FUMARATE 80-4.5 MCG/ACT IN AERO: 2.0000 | INHALATION_SPRAY | Freq: Two times a day (BID) | RESPIRATORY_TRACT | 3 refills | Status: DC

## 2018-04-09 MED ORDER — METFORMIN HCL ER 500 MG PO TB24: ORAL_TABLET | ORAL | 0 refills | Status: DC

## 2018-04-09 MED ORDER — PREDNISONE 20 MG PO TABS: 40.0000 mg | ORAL_TABLET | Freq: Every day | ORAL | 0 refills | Status: DC

## 2018-04-09 MED ORDER — BUDESONIDE-FORMOTEROL FUMARATE 160-4.5 MCG/ACT IN AERO: 2.0000 | INHALATION_SPRAY | Freq: Two times a day (BID) | RESPIRATORY_TRACT | 3 refills | Status: DC

## 2018-04-09 NOTE — Assessment & Plan Note (Signed)
Repeat A1c today is 6.9, confirming diagnosis of type 2 diabetes mellitus; support offered; refer to diabetic education where she can get a meter (I'll prescribe strips to match machine); start metformin; weight loss is key, and she is in the process of pursuing bariatric surgery

## 2018-04-09 NOTE — Progress Notes (Signed)
BP 126/86   Pulse (!) 101   Temp 98.3 F (36.8 C) (Oral)   Resp 18   Ht  (1.6 m)   Wt (!) 389 lb 9.6 oz (176.7 kg)   LMP 04/03/2018   SpO2 93%   BMI 69.01 kg/m    Subjective:    Patient ID: Tanya Reeves, female    DOB: 1985-12-17, 32 y.o.   MRN: [id number]  HPI: [name] is a 32 y.o. female  Chief Complaint  Patient presents with  . Follow-up    sugar  . URI    onset 3 days symptoms include: cough, sob, congestion and sinus drainage    HPI Patient is here for f/u; but still having this cough; SHOB; congestion and sinus drainage; worse this time of year; she has never had allergy testing; every year it does this for six weeks; has happened for 3 years running now; prednisone and tussionex; three rounds Using nebs every 4 hours; she has not had any prednisone this year; already on advair plus singulair plus histamine antagonist  Last A1c was in the diabetes range; patient is hoping that the 6.6 A1c was false reading; all others have always been in the prediabetes range At the last check, she says that she ate an hour before she got here and ran through her a drive through; she wanted to be retested because she is hoping that drive through meal is what made the number go up (I explained to her that the A1c reflects the previous 3 months of blood sugars and is not impacted by fasting or not fasting) She has been checking her FSBS with her husband's meter and getting sugars under 130, once got a 156 after eating spaghetti (post-prandial)  She has started the process for bariatric surgery; she has been to the seminar; the process has started She has done Weight Watchers, Nutrisystem, hCG weight loss program, truevision (successful for a short period of time), slimfast; keto diet for short period of time, not very successful Struggle has been for years; overweight since she was little  Depression screen [facility] 2/9 04/09/2018 02/02/2018 09/04/2017 07/18/2017 08/06/2016    Decreased Interest 0 0 0 0 0  Down, Depressed, Hopeless 0 0 0 0 0  PHQ - 2 Score 0 0 0 0 0    Relevant past medical, surgical, family and social history reviewed Past Medical History:  Diagnosis Date  . Asthma   . Chronic hip pain   . Hypertension   . Migraines   . Morbid obesity (HCC) 03/17/2016  . Sleep apnea    Past Surgical History:  Procedure Laterality Date  . CESAREAN SECTION     2  . CHOLECYSTECTOMY    . COLON SURGERY     part of bowel removed due to c-section   Family History  Problem Relation Age of Onset  . Diabetes Mother   . Cancer Mother        femal organs  . Hypoparathyroidism Mother   . Asthma Mother   . Juvenile idiopathic arthritis Daughter   . Leukemia Paternal Grandmother   . Aneurysm Paternal Grandfather    Social History   Tobacco Use  . Smoking status: Never Smoker  . Smokeless tobacco: Never Used  Substance Use Topics  . Alcohol use: Yes    Alcohol/week: 0.0 oz    Comment: wine  . Drug use: No    Interim medical history since last visit reviewed. Allergies and medications reviewed  Review of Systems Per HPI unless specifically indicated above     Objective:    BP 126/86   Pulse (!) 101   Temp 98.3 F (36.8 C) (Oral)   Resp 18   Ht  (1.6 m)   Wt (!) 389 lb 9.6 oz (176.7 kg)   LMP 04/03/2018   SpO2 93%   BMI 69.01 kg/m   Wt Readings from Last 3 Encounters:  04/09/18 (!) 389 lb 9.6 oz (176.7 kg)  02/02/18 (!) 389 lb 14.4 oz (176.9 kg)  09/04/17 (!) 381 lb 12.8 oz (173.2 kg)    Physical Exam  Constitutional: She appears well-developed and well-nourished. No distress.  Morbidly obese  HENT:  Head: Normocephalic and atraumatic.  Eyes: EOM are normal. No scleral icterus.  Neck: No thyromegaly present.  Cardiovascular: Regular rhythm and normal heart sounds. Tachycardia present.  No murmur heard. Pulses:      Dorsalis pedis pulses are 2+ on the right side, and 2+ on the left side.       Posterior tibial pulses  are 2+ on the right side, and 2+ on the left side.  Borderline tachycardia  Pulmonary/Chest: Effort normal. No stridor. No respiratory distress. She has wheezes (expiratory wheezes). She has no rales.  Abdominal: Soft. Bowel sounds are normal. She exhibits no distension.  Musculoskeletal: Normal range of motion. She exhibits no edema.       Right foot: There is normal range of motion and no deformity.       Left foot: There is normal range of motion and no deformity.  Feet:  Right Foot:  Protective Sensation: 5 sites tested. 5 sites sensed.  Skin Integrity: Negative for ulcer.  Left Foot:  Protective Sensation: 5 sites tested. 5 sites sensed.  Skin Integrity: Negative for ulcer.  Neurological: She is alert.  Skin: Skin is warm and dry. She is not diaphoretic. No pallor.  Psychiatric: She has a normal mood and affect. Her behavior is normal. Judgment and thought content normal.  Vitals reviewed.  Diabetic Foot Form - Detailed   Diabetic Foot Exam - detailed Diabetic Foot exam was performed with the following findings:  Yes 04/09/2018 12:15 PM  Visual Foot Exam completed.:  Yes  Pulse Foot Exam completed.:  Yes  Right Dorsalis Pedis:  Present Left Dorsalis Pedis:  Present  Sensory Foot Exam Completed.:  Yes Semmes-Weinstein Monofilament Test R Site 1-Great Toe:  Pos L Site 1-Great Toe:  Pos        Results for orders placed or performed in visit on 04/09/18  POCT HgB A1C  Result Value Ref Range   Hemoglobin A1C 6.9 (A) 4.0 - 5.6 %   HbA1c, POC (prediabetic range)  5.7 - 6.4 %   HbA1c, POC (controlled diabetic range)  0.0 - 7.0 %      Assessment & Plan:   Problem List Items Addressed This Visit      Cardiovascular and Mediastinum   Essential hypertension, benign (Chronic)    Controlled; try the DASH guidelines        Endocrine   Diabetes mellitus type 2 in obese (HCC)    Repeat A1c today is 6.9, confirming diagnosis of type 2 diabetes mellitus; support offered; refer to  diabetic education where she can get a meter (I'll prescribe strips to match machine); start metformin; weight loss is key, and she is in the process of pursuing bariatric surgery      Relevant Medications   metFORMIN (GLUCOPHAGE XR) 500 MG  24 hr tablet     Other   Morbid obesity (HCC)    Patient is working on weight loss, long-term struggle, moving towards bariatric surgery; unfortunately, she now has type 2 diabetes; as she loses significant weight, I fully expect her numbers to improve      Relevant Medications   metFORMIN (GLUCOPHAGE XR) 500 MG 24 hr tablet    Other Visit Diagnoses    Moderate persistent asthma with exacerbation    -  Primary   start prednisone, Tussionex, switch inhaler from Advair to Symbicort, higher dose; allergy testing   Relevant Medications   montelukast (SINGULAIR) 10 MG tablet   budesonide-formoterol (SYMBICORT) 160-4.5 MCG/ACT inhaler   predniSONE (DELTASONE) 20 MG tablet   Other Relevant Orders   Ambulatory referral to Allergy   Seasonal allergic rhinitis due to pollen       Relevant Orders   Ambulatory referral to Allergy       Follow up plan: Return in about 3 weeks (around 04/30/2018) for follow-up visit with Dr. Sherie Don.  An after-visit summary was printed and given to the patient at check-out.  Please see the patient instructions which may contain other information and recommendations beyond what is mentioned above in the assessment and plan.  Meds ordered this encounter  Medications  . DISCONTD: budesonide-formoterol (SYMBICORT) 80-4.5 MCG/ACT inhaler    Sig: Inhale 2 puffs into the lungs 2 (two) times daily.    Dispense:  1 Inhaler    Refill:  3  . budesonide-formoterol (SYMBICORT) 160-4.5 MCG/ACT inhaler    Sig: Inhale 2 puffs into the lungs 2 (two) times daily.    Dispense:  1 Inhaler    Refill:  3    DO NOT FILL THE 80 STRENGTH -- FILL THIS INSTEAD  . predniSONE (DELTASONE) 20 MG tablet    Sig: Take 2 tablets (40 mg total) by mouth  daily with breakfast.    Dispense:  10 tablet    Refill:  0  . chlorpheniramine-HYDROcodone (TUSSIONEX PENNKINETIC ER) 10-8 MG/5ML SUER    Sig: Take 5 mLs by mouth every 12 (twelve) hours as needed for cough.    Dispense:  115 mL    Refill:  0  . metFORMIN (GLUCOPHAGE XR) 500 MG 24 hr tablet    Sig: One pill by mouth daily x 5 days, then 2 daily x 5 days, then 3 daily x 5 days, then 4 daily    Dispense:  110 tablet    Refill:  0    Orders Placed This Encounter  Procedures  . Ambulatory referral to Allergy  . Ambulatory referral to diabetic education  . POCT HgB A1C

## 2018-04-09 NOTE — Assessment & Plan Note (Addendum)
Patient is working on weight loss, long-term struggle, moving towards bariatric surgery; unfortunately, she now has type 2 diabetes; as she loses significant weight, I fully expect her numbers to improve

## 2018-04-09 NOTE — Assessment & Plan Note (Signed)
Controlled; try the DASH guidelines 

## 2018-04-09 NOTE — Patient Instructions (Addendum)
Check out the information at familydoctor.org entitled "Nutrition for Weight Loss: What You Need to Know about Fad Diets" Try to lose between 1-2 pounds per week by taking in fewer calories and burning off more calories You can succeed by limiting portions, limiting foods dense in calories and fat, becoming more active, and drinking 8 glasses of water a day (64 ounces) Don't skip meals, especially breakfast, as skipping meals may alter your metabolism Do not use over-the-counter weight loss pills or gimmicks that claim rapid weight loss A healthy BMI (or body mass index) is between 18.5 and 24.9 You can calculate your ideal BMI at the NIH website JobEconomics.hu  Diabetes Mellitus and Nutrition When you have diabetes (diabetes mellitus), it is very important to have healthy eating habits because your blood sugar (glucose) levels are greatly affected by what you eat and drink. Eating healthy foods in the appropriate amounts, at about the same times every day, can help you:  Control your blood glucose.  Lower your risk of heart disease.  Improve your blood pressure.  Reach or maintain a healthy weight.  Every person with diabetes is different, and each person has different needs for a meal plan. Your health care provider may recommend that you work with a diet and nutrition specialist (dietitian) to make a meal plan that is best for you. Your meal plan may vary depending on factors such as:  The calories you need.  The medicines you take.  Your weight.  Your blood glucose, blood pressure, and cholesterol levels.  Your activity level.  Other health conditions you have, such as heart or kidney disease.  How do carbohydrates affect me? Carbohydrates affect your blood glucose level more than any other type of food. Eating carbohydrates naturally increases the amount of glucose in your blood. Carbohydrate counting is a method for  keeping track of how many carbohydrates you eat. Counting carbohydrates is important to keep your blood glucose at a healthy level, especially if you use insulin or take certain oral diabetes medicines. It is important to know how many carbohydrates you can safely have in each meal. This is different for every person. Your dietitian can help you calculate how many carbohydrates you should have at each meal and for snack. Foods that contain carbohydrates include:  Bread, cereal, rice, pasta, and crackers.  Potatoes and corn.  Peas, beans, and lentils.  Milk and yogurt.  Fruit and juice.  Desserts, such as cakes, cookies, ice cream, and candy.  How does alcohol affect me? Alcohol can cause a sudden decrease in blood glucose (hypoglycemia), especially if you use insulin or take certain oral diabetes medicines. Hypoglycemia can be a life-threatening condition. Symptoms of hypoglycemia (sleepiness, dizziness, and confusion) are similar to symptoms of having too much alcohol. If your health care provider says that alcohol is safe for you, follow these guidelines:  Limit alcohol intake to no more than 1 drink per day for nonpregnant women and 2 drinks per day for men. One drink equals 12 oz of beer, 5 oz of wine, or 1 oz of hard liquor.  Do not drink on an empty stomach.  Keep yourself hydrated with water, diet soda, or unsweetened iced tea.  Keep in mind that regular soda, juice, and other mixers may contain a lot of sugar and must be counted as carbohydrates.  What are tips for following this plan? Reading food labels  Start by checking the serving size on the label. The amount of calories, carbohydrates, fats,  and other nutrients listed on the label are based on one serving of the food. Many foods contain more than one serving per package.  Check the total grams (g) of carbohydrates in one serving. You can calculate the number of servings of carbohydrates in one serving by dividing the  total carbohydrates by 15. For example, if a food has 30 g of total carbohydrates, it would be equal to 2 servings of carbohydrates.  Check the number of grams (g) of saturated and trans fats in one serving. Choose foods that have low or no amount of these fats.  Check the number of milligrams (mg) of sodium in one serving. Most people should limit total sodium intake to less than 2,300 mg per day.  Always check the nutrition information of foods labeled as "low-fat" or "nonfat". These foods may be higher in added sugar or refined carbohydrates and should be avoided.  Talk to your dietitian to identify your daily goals for nutrients listed on the label. Shopping  Avoid buying canned, premade, or processed foods. These foods tend to be high in fat, sodium, and added sugar.  Shop around the outside edge of the grocery store. This includes fresh fruits and vegetables, bulk grains, fresh meats, and fresh dairy. Cooking  Use low-heat cooking methods, such as baking, instead of high-heat cooking methods like deep frying.  Cook using healthy oils, such as olive, canola, or sunflower oil.  Avoid cooking with butter, cream, or high-fat meats. Meal planning  Eat meals and snacks regularly, preferably at the same times every day. Avoid going long periods of time without eating.  Eat foods high in fiber, such as fresh fruits, vegetables, beans, and whole grains. Talk to your dietitian about how many servings of carbohydrates you can eat at each meal.  Eat 4-6 ounces of lean protein each day, such as lean meat, chicken, fish, eggs, or tofu. 1 ounce is equal to 1 ounce of meat, chicken, or fish, 1 egg, or 1/4 cup of tofu.  Eat some foods each day that contain healthy fats, such as avocado, nuts, seeds, and fish. Lifestyle   Check your blood glucose regularly.  Exercise at least 30 minutes 5 or more days each week, or as told by your health care provider.  Take medicines as told by your health  care provider.  Do not use any products that contain nicotine or tobacco, such as cigarettes and e-cigarettes. If you need help quitting, ask your health care provider.  Work with a Veterinary surgeon or diabetes educator to identify strategies to manage stress and any emotional and social challenges. What are some questions to ask my health care provider?  Do I need to meet with a diabetes educator?  Do I need to meet with a dietitian?  What number can I call if I have questions?  When are the best times to check my blood glucose? Where to find more information:  American Diabetes Association: diabetes.org/food-and-fitness/food  Academy of Nutrition and Dietetics: https://www.vargas.com/  [employer] of Diabetes and Digestive and Kidney Diseases (NIH): FindJewelers.cz Summary  A healthy meal plan will help you control your blood glucose and maintain a healthy lifestyle.  Working with a diet and nutrition specialist (dietitian) can help you make a meal plan that is best for you.  Keep in mind that carbohydrates and alcohol have immediate effects on your blood glucose levels. It is important to count carbohydrates and to use alcohol carefully. This information is not intended to replace advice given  to you by your health care provider. Make sure you discuss any questions you have with your health care provider. Document Released: 08/01/2005 Document Revised: 12/09/2016 Document Reviewed: 12/09/2016 Elsevier Interactive Patient Education  [employer].

## 2018-04-19 ENCOUNTER — Other Ambulatory Visit: Payer: Self-pay

## 2018-04-19 ENCOUNTER — Encounter: Payer: Self-pay | Admitting: Gynecology

## 2018-04-19 ENCOUNTER — Ambulatory Visit
Admission: EM | Admit: 2018-04-19 | Discharge: 2018-04-19 | Disposition: A | Discharge status: Home / Self Care | Payer: 59 | Attending: Orthopedic Surgery | Admitting: Orthopedic Surgery

## 2018-04-19 ENCOUNTER — Ambulatory Visit: Payer: 59

## 2018-04-19 DIAGNOSIS — R05 Cough: Secondary | ICD-10-CM

## 2018-04-19 DIAGNOSIS — J209 Acute bronchitis, unspecified: Secondary | ICD-10-CM | POA: Diagnosis not present

## 2018-04-19 IMAGING — CR DG CHEST 2V
2 series · 2 of 2 positions shown · non-contrast
Comparison: Chest x-ray dated [DATE].

CLINICAL DATA: Shortness and cough for 2 and half weeks. History of
asthma.

EXAM:
CHEST - 2 VIEW

[chest pa]
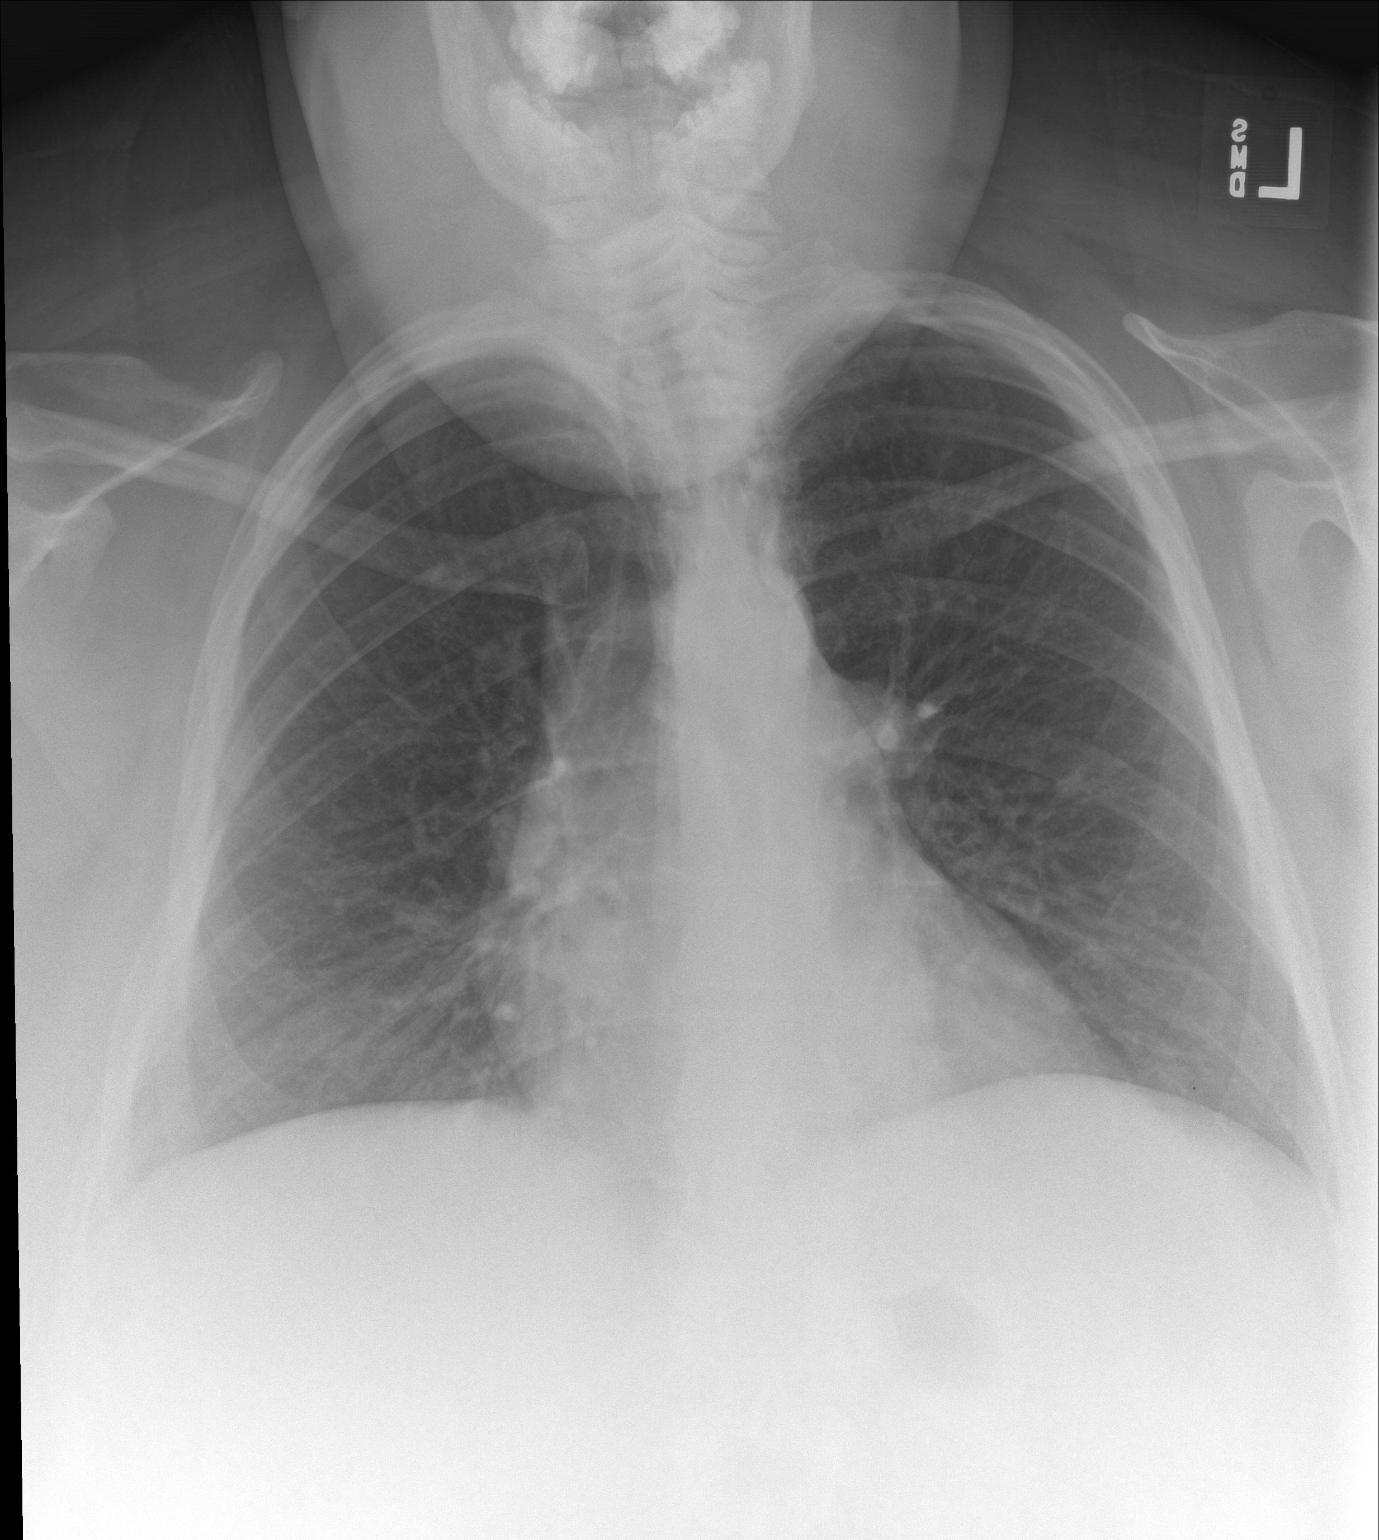

[chest lat]
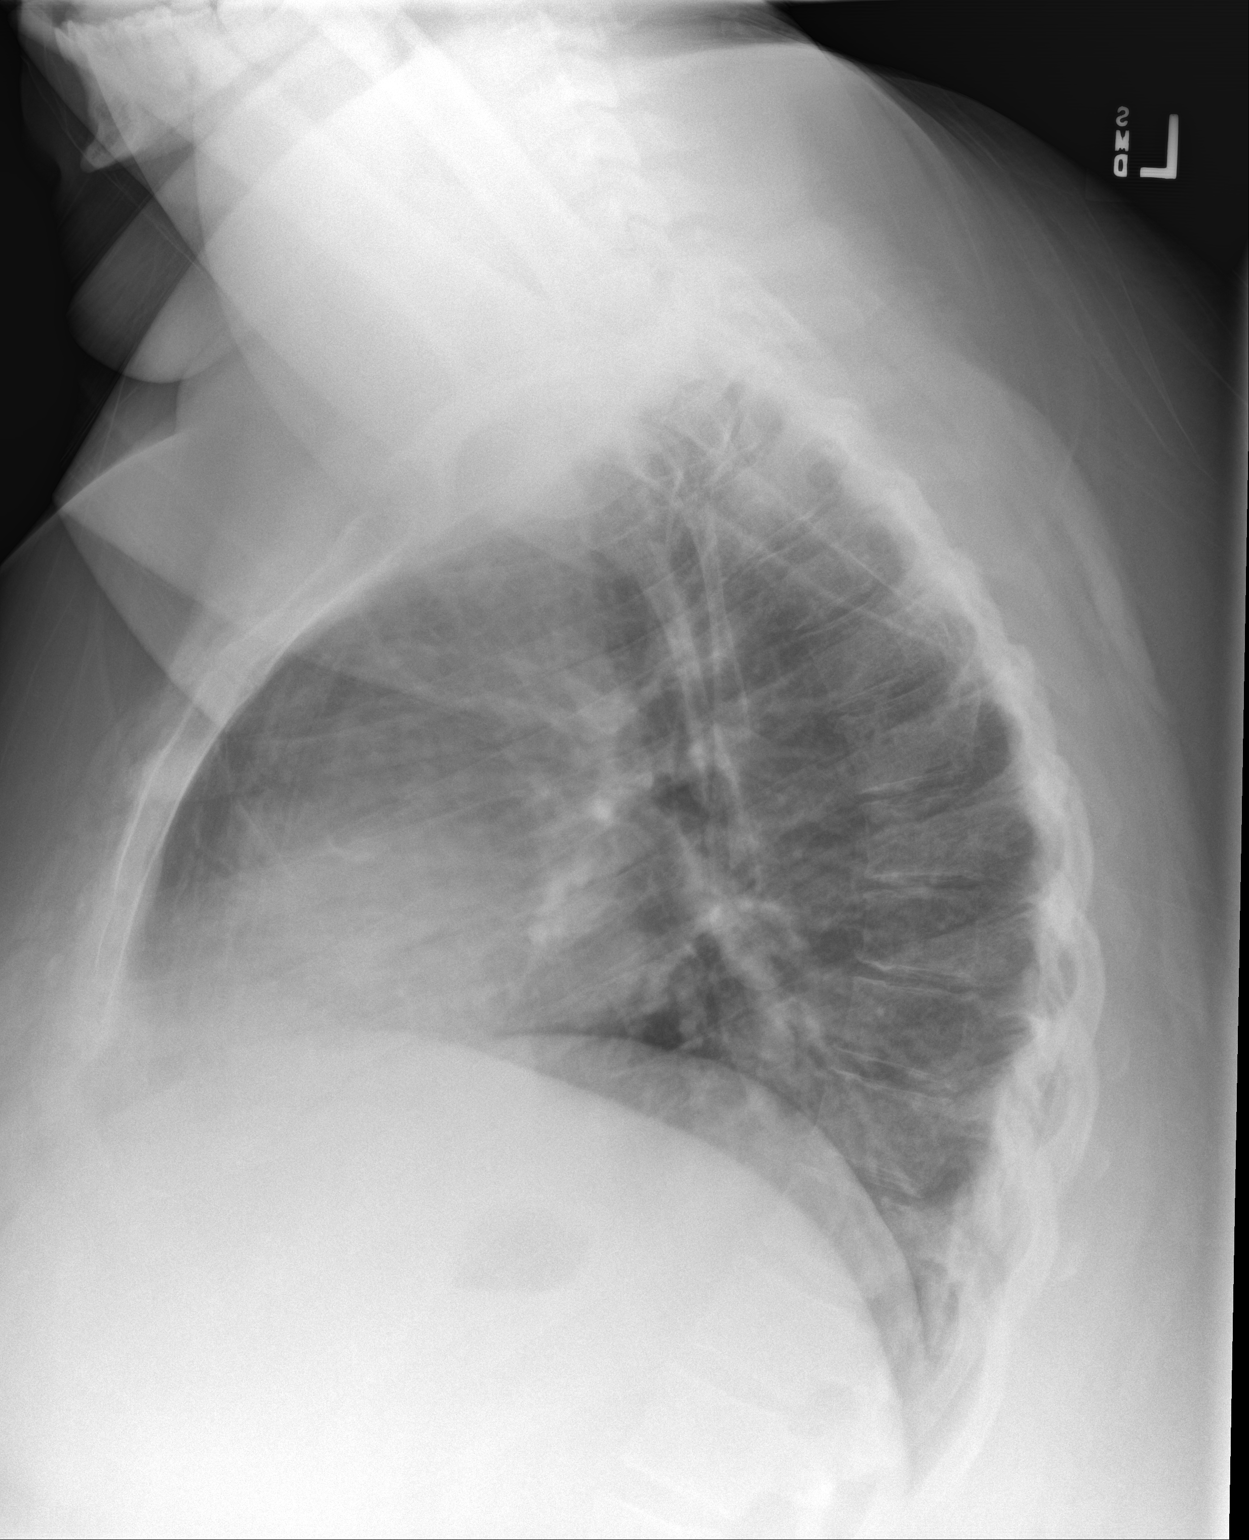

[2 of 2 positions shown; findings below may reference images not displayed]

FINDINGS: The heart size and mediastinal contours are within normal limits.
Both lungs are clear. The visualized skeletal structures are
unremarkable.
IMPRESSION: No active cardiopulmonary disease. No evidence of pneumonia or
pulmonary edema.

## 2018-04-19 MED ORDER — PREDNISONE 10 MG PO TABS: ORAL_TABLET | ORAL | 0 refills | Status: DC

## 2018-04-19 MED ORDER — IPRATROPIUM-ALBUTEROL 0.5-2.5 (3) MG/3ML IN SOLN
3.0000 mL | Freq: Four times a day (QID) | RESPIRATORY_TRACT | Status: DC
  Administered 2018-04-19: 3 mL via RESPIRATORY_TRACT

## 2018-04-19 NOTE — ED Provider Notes (Signed)
[facility]    CSN: [id number] Arrival date & time: 04/19/18  1031     History   Chief Complaint Chief Complaint  Patient presents with  . Cough    HPI Tanya Reeves is a 32 y.o. female.   Presents to the urgent care facility for evaluation of cough.  She is had a cough for 2-1/2 weeks, last week was treated with 5-day prednisone taper, breathing treatments and saw significant improvement.  After finishing the prednisone taper symptoms are back.  Patient states she has severe dry cough.  No chest pain.  She does have some wheezing and shortness of breath.  She has been using some albuterol nebs at home with some intermittent improvement.  She denies any fevers.  Cough is slightly productive.  She is taking antihistamine medication.  She denies any lower extremity swelling, history of blood clots or pulmonary embolism. HPI  Past Medical History:  Diagnosis Date  . Asthma   . Chronic hip pain   . Hypertension   . Migraines   . Morbid obesity (HCC) 03/17/2016  . Sleep apnea     Patient Active Problem List   Diagnosis Date Noted  . Asthma 07/18/2017  . Heavy periods 07/18/2017  . Hidradenitis suppurativa 03/24/2017  . Chronic sinusitis 10/02/2016  . High triglycerides 10/02/2016  . Shortness of breath on exertion 08/26/2016  . Morbid obesity (HCC) 03/17/2016  . Essential hypertension, benign 02/26/2016  . Migraine without aura 02/26/2016  . OSA on CPAP 02/26/2016  . Medication monitoring encounter 02/26/2016  . Diabetes mellitus type 2 in obese (HCC) 02/27/2015  . Pre-existing hypertension during pregnancy in third trimester 09/23/2014  . Left ventricular hypertrophy 08/04/2014  . Previous cesarean delivery affecting pregnancy 07/07/2014    Past Surgical History:  Procedure Laterality Date  . CESAREAN SECTION     2  . CHOLECYSTECTOMY    . COLON SURGERY     part of bowel removed due to c-section    OB History   None      Home Medications     Prior to Admission medications   Medication Sig Start Date End Date Taking? Authorizing Provider  albuterol (PROAIR HFA) 108 (90 Base) MCG/ACT inhaler Inhale 2 puffs into the lungs every 4 (four) hours as needed. 09/04/17 09/04/18 Yes Lada, Janit Bern, MD  albuterol (PROVENTIL) (2.5 MG/3ML) 0.083% nebulizer solution Take 3 mLs (2.5 mg total) by nebulization every 6 (six) hours as needed for wheezing or shortness of breath. 08/06/16  Yes Ellyn Hack, MD  budesonide-formoterol [facility]) 160-4.5 MCG/ACT inhaler Inhale 2 puffs into the lungs 2 (two) times daily. 04/09/18  Yes Lada, Janit Bern, MD  chlorpheniramine-HYDROcodone (TUSSIONEX PENNKINETIC ER) 10-8 MG/5ML SUER Take 5 mLs by mouth every 12 (twelve) hours as needed for cough. 04/09/18  Yes Lada, Janit Bern, MD  diphenhydrAMINE (BENADRYL) 50 MG capsule Take 50 mg by mouth every 4 (four) hours as needed.   Yes [provider]  eletriptan (RELPAX) 20 MG tablet TAKE 1 TABLET BY MOUTH AS NEEDED FOR MIGRAINE OR HEADACHE. MAY REPEAT IN 2 HOURS IF HEADACHE PERSISTS OR RECURS 10/29/17  Yes Lada, Janit Bern, MD  fluticasone (FLONASE) 50 MCG/ACT nasal spray Place 2 sprays into both nostrils daily.   Yes [provider]  levocetirizine (XYZAL) 5 MG tablet Take 5 mg by mouth every evening.   Yes [provider]  lisinopril-hydrochlorothiazide (PRINZIDE,ZESTORETIC) 10-12.5 MG tablet TAKE 1 TABLET BY MOUTH DAILY, IF ANY PLANS FOR  PREGNANCY, CALL DOCTOR, DO NOT TAKE IF YOU THINK YOU MIGHT BE PREGNANT 10/10/17  Yes Lada, Janit BernMelinda P, MD  montelukast (SINGULAIR) 10 MG tablet Take 10 mg by mouth at bedtime.   Yes [provider]  ranitidine (ZANTAC) 150 MG tablet Take 2 tablets (300 mg total) by mouth at bedtime as needed. 02/02/18  Yes Lada, Janit BernMelinda P, MD  metFORMIN (GLUCOPHAGE-XR) 500 MG 24 hr tablet TAKE 1 TABLET BY MOUTH DAILY FOR 5 DAYS, 2 TABLETS DAILY FOR 5 DAYS, 3 TABLETS DAILY FOR 5 DAYS, THEN 4 TABLETS DAILY 04/09/18    Lada, Janit BernMelinda P, MD  nystatin cream (MYCOSTATIN) APPLY EXTERNALLY TO THE AFFECTED AREA TWICE DAILY AS NEEDED 02/02/18   Lada, Janit BernMelinda P, MD  predniSONE (DELTASONE) 10 MG tablet 10 day taper. 5,5,4,4,3,3,2,2,1,1 04/19/18   Evon SlackGaines, Clance Baquero C, PA-C    Family History Family History  Problem Relation Age of Onset  . Diabetes Mother   . Cancer Mother        femal organs  . Hypoparathyroidism Mother   . Asthma Mother   . Juvenile idiopathic arthritis Daughter   . Leukemia Paternal Grandmother   . Aneurysm Paternal Grandfather     Social History Social History   Tobacco Use  . Smoking status: Never Smoker  . Smokeless tobacco: Never Used  Substance Use Topics  . Alcohol use: Yes    Alcohol/week: 0.0 oz    Comment: wine  . Drug use: No     Allergies   Cinnamon and Dm-guaifenesin er   Review of Systems Review of Systems  Constitutional: Negative for activity change, chills, fatigue and fever.  HENT: Positive for congestion. Negative for sinus pressure and sore throat.   Eyes: Negative for visual disturbance.  Respiratory: Positive for cough, shortness of breath and wheezing. Negative for chest tightness.   Cardiovascular: Negative for chest pain and leg swelling.  Gastrointestinal: Negative for abdominal pain, diarrhea, nausea and vomiting.  Genitourinary: Negative for dysuria.  Musculoskeletal: Negative for arthralgias and gait problem.  Skin: Negative for rash.  Neurological: Negative for weakness, numbness and headaches.  Hematological: Negative for adenopathy.  Psychiatric/Behavioral: Negative for agitation, behavioral problems and confusion.     Physical Exam Triage Vital Signs ED Triage Vitals  Enc Vitals Group     BP 04/19/18 1043 (!) 142/68     Pulse Rate 04/19/18 1043 99     Resp 04/19/18 1043 18     Temp 04/19/18 1043 98.4 F (36.9 C)     Temp Source 04/19/18 1043 Oral     SpO2 04/19/18 1043 96 %     Weight 04/19/18 1045 (!) 389 lb (176.4 kg)     Height  04/19/18 1045 5\' 3"  (1.6 m)     Head Circumference --      Peak Flow --      Pain Score 04/19/18 1045 7     Pain Loc --      Pain Edu? --      Excl. in GC? --    No data found.  Updated Vital Signs BP (!) 142/68 (BP Location: Right Arm)   Pulse 99   Temp 98.4 F (36.9 C) (Oral)   Resp 18   Ht 5\' 3"  (1.6 m)   Wt (!) 389 lb (176.4 kg)   LMP 04/03/2018   SpO2 96%   BMI 68.91 kg/m   Visual Acuity Right Eye Distance:   Left Eye Distance:   Bilateral Distance:    Right Eye Near:  Left Eye Near:    Bilateral Near:     Physical Exam  Constitutional: She is oriented to person, place, and time. She appears well-developed and well-nourished. No distress.  HENT:  Head: Normocephalic and atraumatic.  Mouth/Throat: Oropharynx is clear and moist.  Eyes: Pupils are equal, round, and reactive to light. EOM are normal. Right eye exhibits no discharge. Left eye exhibits no discharge.  Neck: Normal range of motion. Neck supple.  Cardiovascular: Normal rate, regular rhythm and intact distal pulses.  Pulmonary/Chest: Effort normal. No respiratory distress. She has wheezes. She exhibits no tenderness.  Abdominal: Soft. She exhibits no distension. There is no tenderness.  Musculoskeletal: Normal range of motion. She exhibits no edema.  Neurological: She is alert and oriented to person, place, and time. She has normal reflexes.  Skin: Skin is warm and dry.  Psychiatric: She has a normal mood and affect. Her behavior is normal. Thought content normal.     UC Treatments / Results  Labs (all labs ordered are listed, but only abnormal results are displayed) Labs Reviewed - No data to display  EKG None  Radiology Dg Chest 2 View  Result Date: 04/19/2018 CLINICAL DATA:  Shortness and cough for 2 and half weeks. History of asthma. EXAM: CHEST - 2 VIEW COMPARISON:  Chest x-ray dated 09/04/2017. FINDINGS: The heart size and mediastinal contours are within normal limits. Both lungs are  clear. The visualized skeletal structures are unremarkable. IMPRESSION: No active cardiopulmonary disease. No evidence of pneumonia or pulmonary edema. Electronically Signed   By: Bary Richard M.D.   On: 04/19/2018 11:40    Procedures Procedures (including critical care time)  Medications Ordered in UC Medications  ipratropium-albuterol (DUONEB) 0.5-2.5 (3) MG/3ML nebulizer solution 3 mL (3 mLs Nebulization Given 04/19/18 1120)    Initial Impression / Assessment and Plan / UC Course  I have reviewed the triage vital signs and the nursing notes.  Pertinent labs & imaging results that were available during my care of the patient were reviewed by me and considered in my medical decision making (see chart for details).    32 year old female with acute bronchitis.  Patient states she gets this every year, typically needs 2 rounds of steroids.  Chest x-ray today showed no evidence of pneumonia.  She will continue with albuterol nebs at home along with antihistamines.  She is educated on signs and symptoms return to the urgent care facility for. Final Clinical Impressions(s) / UC Diagnoses   Final diagnoses:  Acute bronchitis, unspecified organism     Discharge Instructions     Please continue with albuterol nebs and Tussionex as needed.  Please take prednisone as prescribed    ED Prescriptions    Medication Sig Dispense Auth. Provider   predniSONE (DELTASONE) 10 MG tablet 10 day taper. 5,5,4,4,3,3,2,2,1,1 30 tablet Ronnette Juniper        Evon Slack, [place] 04/19/18 1211

## 2018-04-19 NOTE — ED Triage Notes (Signed)
Per patient c/o seen PCP x  10 days ago for cough and hurt to take a deep breath.. Per patient was given prednisone 40 mg for 5 days with no taper. She was also was given albuterol and Symbicort. Per patient finish with the prednisone and symptoms started again.

## 2018-04-19 NOTE — Discharge Instructions (Signed)
Please continue with albuterol nebs and Tussionex as needed.  Please take prednisone as prescribed

## 2018-04-30 ENCOUNTER — Ambulatory Visit (INDEPENDENT_AMBULATORY_CARE_PROVIDER_SITE_OTHER): Payer: 59 | Admitting: Family Medicine

## 2018-04-30 ENCOUNTER — Encounter: Payer: Self-pay | Admitting: Family Medicine

## 2018-04-30 VITALS — BP 122/82 | HR 99 | Temp 98.0°F | Resp 14 | Ht 63.0 in | Wt 394.3 lb

## 2018-04-30 DIAGNOSIS — E669 Obesity, unspecified: Secondary | ICD-10-CM

## 2018-04-30 DIAGNOSIS — J209 Acute bronchitis, unspecified: Secondary | ICD-10-CM | POA: Diagnosis not present

## 2018-04-30 DIAGNOSIS — E1169 Type 2 diabetes mellitus with other specified complication: Secondary | ICD-10-CM | POA: Diagnosis not present

## 2018-04-30 DIAGNOSIS — E1165 Type 2 diabetes mellitus with hyperglycemia: Secondary | ICD-10-CM | POA: Diagnosis not present

## 2018-04-30 NOTE — Patient Instructions (Addendum)
Please do see your eye doctor regularly, and have your eyes examined every year (or more often per his or her recommendation) Check your feet every night and let me know right away of any sores, infections, numbness, etc. Try to limit sweets, white bread, white rice, white potatoes It is okay with me for you to not check your fingerstick blood sugars (per [employer] Best Practices), unless you are interested and feel it would be helpful for you  Call me if symptoms flare back up (let's hope it clears up alone)

## 2018-04-30 NOTE — Progress Notes (Signed)
BP 122/82   Pulse 99   Temp 98 F (36.7 C) (Oral)   Resp 14   Ht 5\' 3"  (1.6 m)   Wt (!) 394 lb 4.8 oz (178.9 kg)   LMP 04/03/2018   SpO2 97%   BMI 69.85 kg/m    Subjective:    Patient ID: Tanya Reeves, female    DOB: [birth year], 32 y.o.   MRN: [id number]  HPI: KORISSA Reeves is a 32 y.o. female  Chief Complaint  Patient presents with  . Follow-up    HPI Patient is here for f/u She was seen in the ER last week (June 2nd) with bronchitis; I saw her on 04/09/18 for moderate asthma; had tussionex and did not have to use after the first few days; finished the steroids; sputum is now a little greenish; in the past, has had flares after finishing the steroids; she has had two rounds; has had third round with levaquin the last 3 years; she'll call me if needed  She has type 2 diabetes; going to meet with diabetic educator soon; already limiting sweets and sugary drinks anyway; more successful with food limitation, definitely thinking about it even more; started on the metformin just recently because of concern over possible GI side effects; stomach is pretty good right now; does not have a gallbladder and has weird bowels after eating anyway, but will see; nothing changed so far; no problems with feet Last A1c was 6.9  She is going to meet with surgeon for bariatric surgery soon; hoping that all the insurance works out  Depression screen [facility] 2/9 05/11/2018 04/09/2018 02/02/2018 09/04/2017 07/18/2017  Decreased Interest 0 0 0 0 0  Down, Depressed, Hopeless 0 0 0 0 0  PHQ - 2 Score 0 0 0 0 0    Relevant past medical, surgical, family and social history reviewed Past Medical History:  Diagnosis Date  . Asthma   . Chronic hip pain   . Diabetes mellitus without complication (HCC)   . Hypertension   . Migraines   . Morbid obesity (HCC) 03/17/2016  . Sleep apnea    Past Surgical History:  Procedure Laterality Date  . CESAREAN SECTION     2  . CHOLECYSTECTOMY    . COLON  SURGERY     part of bowel removed due to c-section   Family History  Problem Relation Age of Onset  . Diabetes Mother   . Cancer Mother        femal organs  . Hypoparathyroidism Mother   . Asthma Mother   . Diabetes Father   . Juvenile idiopathic arthritis Daughter   . Diabetes Maternal Grandmother   . Diabetes Maternal Grandfather   . Leukemia Paternal Grandmother   . Aneurysm Paternal Grandfather    Social History   Tobacco Use  . Smoking status: Never Smoker  . Smokeless tobacco: Never Used  Substance Use Topics  . Alcohol use: Yes    Alcohol/week: 0.0 oz    Comment: wine - 2 drinks per month  . Drug use: No    Interim medical history since last visit reviewed. Allergies and medications reviewed  Review of Systems Per HPI unless specifically indicated above     Objective:    BP 122/82   Pulse 99   Temp 98 F (36.7 C) (Oral)   Resp 14   Ht 5\' 3"  (1.6 m)   Wt (!) 394 lb 4.8 oz (178.9 kg)   LMP 04/03/2018   SpO2  97%   BMI 69.85 kg/m   Wt Readings from Last 3 Encounters:  05/11/18 (!) 393 lb 9.6 oz (178.5 kg)  04/30/18 (!) 394 lb 4.8 oz (178.9 kg)  04/19/18 (!) 389 lb (176.4 kg)    Physical Exam  Constitutional: She appears well-developed and well-nourished. No distress.  HENT:  Head: Normocephalic and atraumatic.  Right Ear: Tympanic membrane and ear canal normal.  Left Ear: Tympanic membrane and ear canal normal.  Mouth/Throat: No posterior oropharyngeal edema or posterior oropharyngeal erythema.  Eyes: EOM are normal. No scleral icterus.  Neck: No thyromegaly present.  Cardiovascular: Normal rate, regular rhythm and normal heart sounds.  No murmur heard. Pulmonary/Chest: Effort normal and breath sounds normal. No respiratory distress. She has no wheezes.  Abdominal: Soft. Bowel sounds are normal. She exhibits no distension.  Musculoskeletal: Normal range of motion. She exhibits no edema.  Neurological: She is alert. She exhibits normal muscle  tone.  Skin: Skin is warm and dry. She is not diaphoretic. No pallor.  Psychiatric: She has a normal mood and affect. Her behavior is normal. Judgment and thought content normal.   Diabetic Foot Form - Detailed   Diabetic Foot Exam - detailed Diabetic Foot exam was performed with the following findings:  Yes 04/30/2018  4:02 PM  Visual Foot Exam completed.:  Yes  Pulse Foot Exam completed.:  Yes  Right Dorsalis Pedis:  Present Left Dorsalis Pedis:  Present  Sensory Foot Exam Completed.:  Yes Semmes-Weinstein Monofilament Test R Site 1-Great Toe:  Pos L Site 1-Great Toe:  Pos           Assessment & Plan:   Problem List Items Addressed This Visit      Endocrine   Diabetes mellitus type 2 in obese (HCC) (Chronic)   Relevant Orders   Microalbumin / creatinine urine ratio (Completed)     Other   Morbid obesity (HCC) (Chronic)    She is pursuing bariatric surgery and has my full support       Other Visit Diagnoses    Type 2 diabetes mellitus with hyperglycemia, without long-term current use of insulin (HCC)    -  Primary   Relevant Orders   Ambulatory referral to Ophthalmology   Microalbumin / creatinine urine ratio (Completed)   Acute bronchitis, unspecified organism       sounds to be resolving; she will call me if worsening; no indication for additional medicine at this time       Follow up plan: Return in about 2 months (around 07/13/2018) for follow-up visit with Dr. Sherie DonLada; physical sometime soon.  An after-visit summary was printed and given to the patient at check-out.  Please see the patient instructions which may contain other information and recommendations beyond what is mentioned above in the assessment and plan.  No orders of the defined types were placed in this encounter.   Orders Placed This Encounter  Procedures  . Microalbumin / creatinine urine ratio  . Ambulatory referral to Ophthalmology

## 2018-05-01 ENCOUNTER — Encounter: Payer: Self-pay | Admitting: Family Medicine

## 2018-05-01 DIAGNOSIS — R809 Proteinuria, unspecified: Secondary | ICD-10-CM | POA: Insufficient documentation

## 2018-05-01 LAB — MICROALBUMIN / CREATININE URINE RATIO
Creatinine, Urine: 140 mg/dL (ref 20–275)
Microalb Creat Ratio: 35 mcg/mg creat — ABNORMAL HIGH (ref <30)
Microalb, Ur: 4.9 mg/dL

## 2018-05-04 ENCOUNTER — Encounter: Payer: Self-pay | Admitting: Family Medicine

## 2018-05-06 ENCOUNTER — Ambulatory Visit: Payer: 59 | Admitting: *Deleted

## 2018-05-11 ENCOUNTER — Encounter: Payer: 59 | Attending: Family Medicine | Admitting: *Deleted

## 2018-05-11 ENCOUNTER — Encounter: Payer: Self-pay | Admitting: *Deleted

## 2018-05-11 VITALS — BP 122/76 | Ht 63.0 in | Wt 393.6 lb

## 2018-05-11 DIAGNOSIS — Z713 Dietary counseling and surveillance: Secondary | ICD-10-CM | POA: Insufficient documentation

## 2018-05-11 DIAGNOSIS — E1165 Type 2 diabetes mellitus with hyperglycemia: Secondary | ICD-10-CM | POA: Diagnosis not present

## 2018-05-11 DIAGNOSIS — Z6841 Body Mass Index (BMI) 40.0 and over, adult: Secondary | ICD-10-CM | POA: Diagnosis not present

## 2018-05-11 DIAGNOSIS — E119 Type 2 diabetes mellitus without complications: Secondary | ICD-10-CM

## 2018-05-11 NOTE — Progress Notes (Signed)
Diabetes Self-Management Education  Visit Type: First/Initial  Appt. Start Time: 1100 Appt. End Time: 1205  05/11/2018  Ms. Tanya Reeves, identified by name and date of birth, is a 32 y.o. female with a diagnosis of Diabetes: Type 2.   ASSESSMENT  Blood pressure 122/76, height 5\' 3"  (1.6 m), weight (!) 393 lb 9.6 oz (178.5 kg). Body mass index is 69.72 kg/m.  Diabetes Self-Management Education - 05/11/18 1236      Visit Information   Visit Type  First/Initial      Initial Visit   Diabetes Type  Type 2    Are you currently following a meal plan?  Yes    What type of meal plan do you follow?  "trying to reduce carb intake"    Are you taking your medications as prescribed?  Yes    Date Diagnosed  1 month      Health Coping   How would you rate your overall health?  Fair      Psychosocial Assessment   Patient Belief/Attitude about Diabetes  Other (comment) "coping okay"    Self-care barriers  None    Self-management support  Doctor's office;Family    Other persons present  Other (comment) 2 young children - ages 66 and 7    Patient Concerns  Nutrition/Meal planning;Glycemic Control;Medication;Weight Control;Monitoring    Special Needs  None    Preferred Learning Style  Hands on;Auditory    Learning Readiness  Ready    How often do you need to have someone help you when you read instructions, pamphlets, or other written materials from your doctor or pharmacy?  1 - Never    What is the last grade level you completed in school?  Associate Degree/Diploma for RN      Pre-Education Assessment   Patient understands the diabetes disease and treatment process.  Needs Review    Patient understands incorporating nutritional management into lifestyle.  Needs Instruction    Patient undertands incorporating physical activity into lifestyle.  Needs Instruction    Patient understands using medications safely.  Needs Review    Patient understands monitoring blood glucose, interpreting and  using results  Needs Review    Patient understands prevention, detection, and treatment of acute complications.  Needs Instruction    Patient understands prevention, detection, and treatment of chronic complications.  Needs Review    Patient understands how to develop strategies to address psychosocial issues.  Needs Instruction    Patient understands how to develop strategies to promote health/change behavior.  Needs Instruction      Complications   Last HgB A1C per patient/outside source  6.9 % 04/09/18    How often do you check your blood sugar?  1-2 times/day Pt has been checking her blood sugars at home with her husband's meter. Provided One Touch Verio Flex and instructed on use. BG upon return demonstration was 159 mg/dL at 16:10 am - 3 hrs pp.     Fasting Blood glucose range (mg/dL)  960-454 FBG's range from 138-146 mg/dL    Postprandial Blood glucose range (mg/dL)  -- pp's range from 098-119'J mg/dL.     Have you had a dilated eye exam in the past 12 months?  Yes    Have you had a dental exam in the past 12 months?  No    Are you checking your feet?  Yes    How many days per week are you checking your feet?  1      Dietary Intake  Breakfast  skips    Lunch  meat, cheese and crackers; sandwich; pasta    Snack (afternoon)  occasionally - [place], ham, cheese stick, goldfish, cheese and crackers    Dinner  chicken, beef, pork, fish, potatoes, rice, pasta, beans, peas, corn, broccoli, cauliflower, salads    Beverage(s)  water, regular soda, sugar sweetened tea      Exercise   Exercise Type  ADL's      Patient Education   Previous Diabetes Education  No    Disease state   Explored patient's options for treatment of their diabetes    Nutrition management   Role of diet in the treatment of diabetes and the relationship between the three main macronutrients and blood glucose level;Food label reading, portion sizes and measuring food.;Reviewed blood glucose goals for pre and post meals  and how to evaluate the patients' food intake on their blood glucose level.    Physical activity and exercise   Role of exercise on diabetes management, blood pressure control and cardiac health.    Medications  Reviewed patients medication for diabetes, action, purpose, timing of dose and side effects.    Monitoring  Taught/evaluated SMBG meter.;Purpose and frequency of SMBG.;Taught/discussed recording of test results and interpretation of SMBG.;Identified appropriate SMBG and/or A1C goals.    Chronic complications  Relationship between chronic complications and blood glucose control    Psychosocial adjustment  Identified and addressed patients feelings and concerns about diabetes      Individualized Goals (developed by patient)   Reducing Risk  Improve blood sugars Decrease medications Prevent diabetes complications Lose weight     Outcomes   Expected Outcomes  Demonstrated interest in learning. Expect positive outcomes    Future DMSE  4-6 wks       Individualized Plan for Diabetes Self-Management Training:   Learning Objective:  Patient will have a greater understanding of diabetes self-management. Patient education plan is to attend individual and/or group sessions per assessed needs and concerns.   Plan:   Patient Instructions  Check blood sugars 2 x day before breakfast and 2 hrs after one meal every day Bring blood sugar records to the next class Call your doctor for a prescription for:  1. Meter strips (type) One Touch Verio checking  2 times per day  2. Lancets (type) One Touch Delica checking  2     times per day Exercise:  Begin walking for   10  minutes   3  days a week and increase gradually Eat 3 meals day,   1-2  snacks a day Space meals 4-6 hours apart Don't skip meals Avoid sugar sweetened drinks (soda, tea)  Expected Outcomes:  Demonstrated interest in learning. Expect positive outcomes  Education material provided:  General Meal Planning Guidelines Simple  Meal Plan Meter = One Touch Verio Flex  If problems or questions, patient to contact team via:  Sharion SettlerSheila Alliah Boulanger, RN, CCM, CDE [phone]  Future DSME appointment: 4-6 wks  June 25, 2018 for Diabetes Class 1

## 2018-05-11 NOTE — Patient Instructions (Signed)
Check blood sugars 2 x day before breakfast and 2 hrs after one meal every day Bring blood sugar records to the next class  Call your doctor for a prescription for:  1. Meter strips (type) One Touch Verio checking  2 times per day  2. Lancets (type) One Touch Delica checking  2     times per day  Exercise:  Begin walking for   10  minutes   3  days a week and increase gradually  Eat 3 meals day,   1-2  snacks a day Space meals 4-6 hours apart Don't skip meals Avoid sugar sweetened drinks (soda, tea)  Return for classes on:

## 2018-05-12 ENCOUNTER — Encounter: Payer: Self-pay | Admitting: Family Medicine

## 2018-05-12 ENCOUNTER — Other Ambulatory Visit: Payer: Self-pay

## 2018-05-12 DIAGNOSIS — E1165 Type 2 diabetes mellitus with hyperglycemia: Secondary | ICD-10-CM

## 2018-05-12 MED ORDER — GLUCOSE BLOOD VI STRP: ORAL_STRIP | 3 refills | Status: DC

## 2018-05-12 MED ORDER — ONETOUCH ULTRASOFT LANCETS MISC: 3 refills | Status: DC

## 2018-05-12 NOTE — Progress Notes (Signed)
Rxs added by CMA, prescriptions sent; thank you

## 2018-05-12 NOTE — Assessment & Plan Note (Signed)
She is pursuing bariatric surgery and has my full support

## 2018-05-13 ENCOUNTER — Ambulatory Visit: Payer: 59 | Admitting: *Deleted

## 2018-06-25 ENCOUNTER — Encounter: Payer: 59 | Attending: Family Medicine | Admitting: *Deleted

## 2018-06-25 ENCOUNTER — Encounter: Payer: Self-pay | Admitting: *Deleted

## 2018-06-25 VITALS — Wt 392.2 lb

## 2018-06-25 DIAGNOSIS — Z713 Dietary counseling and surveillance: Secondary | ICD-10-CM | POA: Diagnosis not present

## 2018-06-25 DIAGNOSIS — E119 Type 2 diabetes mellitus without complications: Secondary | ICD-10-CM

## 2018-06-25 DIAGNOSIS — Z6841 Body Mass Index (BMI) 40.0 and over, adult: Secondary | ICD-10-CM | POA: Insufficient documentation

## 2018-06-25 DIAGNOSIS — E1165 Type 2 diabetes mellitus with hyperglycemia: Secondary | ICD-10-CM | POA: Diagnosis not present

## 2018-06-25 NOTE — Progress Notes (Signed)

## 2018-06-30 ENCOUNTER — Ambulatory Visit: Payer: 59 | Admitting: Family Medicine

## 2018-06-30 ENCOUNTER — Encounter: Payer: Self-pay | Admitting: Family Medicine

## 2018-06-30 ENCOUNTER — Other Ambulatory Visit: Payer: Self-pay | Admitting: Family Medicine

## 2018-06-30 VITALS — BP 114/84 | HR 115 | Temp 98.6°F | Ht 63.0 in | Wt 393.5 lb

## 2018-06-30 DIAGNOSIS — R809 Proteinuria, unspecified: Secondary | ICD-10-CM

## 2018-06-30 DIAGNOSIS — E1129 Type 2 diabetes mellitus with other diabetic kidney complication: Secondary | ICD-10-CM

## 2018-06-30 DIAGNOSIS — E1169 Type 2 diabetes mellitus with other specified complication: Secondary | ICD-10-CM

## 2018-06-30 DIAGNOSIS — T753XXA Motion sickness, initial encounter: Secondary | ICD-10-CM

## 2018-06-30 DIAGNOSIS — I10 Essential (primary) hypertension: Secondary | ICD-10-CM

## 2018-06-30 DIAGNOSIS — E669 Obesity, unspecified: Secondary | ICD-10-CM

## 2018-06-30 MED ORDER — LISINOPRIL-HYDROCHLOROTHIAZIDE 10-12.5 MG PO TABS
1.0000 | ORAL_TABLET | Freq: Every day | ORAL | 3 refills | Status: DC
Start: 2018-06-30 — End: 2018-10-26

## 2018-06-30 MED ORDER — SCOPOLAMINE 1 MG/3DAYS TD PT72: 1.0000 | MEDICATED_PATCH | TRANSDERMAL | 12 refills | Status: DC

## 2018-06-30 NOTE — Progress Notes (Signed)
BP 114/84   Pulse (!) 115   Temp 98.6 F (37 C)   Ht 5\' 3"  (1.6 m)   Wt (!) 393 lb 8 oz (178.5 kg)   LMP 05/04/2018   SpO2 94%   BMI 69.71 kg/m   HR 80 by auscultation by MD  Subjective:    Patient ID: Tanya Reeves, female    DOB: 1986/11/16, 32 y.o.   MRN: [id number]  HPI: Tanya Reeves is a 32 y.o. female  Chief Complaint  Patient presents with  . Follow-up    HPI Here for f/u Going on a cruise; requesting patches for sea sickness if needed Morbid obesity; she has done the steps for bariatric surgery; has her paperwork in and just waiting on things In process right now; awaiting on finding out about coverage and how much it will cost for you Trying to be more mindful about eating Going to the nutritional classes The metformin was not tolerated very well; GI upset Checking her sugars; some elevated, but because of her eating pattern; going long times in between meals; sometimes higher in the mornings; trying to correct that and it's helpful; she is feeling symptomatic when sugar is 110; body realizing she is eating less carbs; sometimes skips the metformin Since being in the class she has learned about 4 ounces of soft drink; just one soda a week Definitely making choices and changes already Herald of water intake, increased that dramatically When she is going to the office, parking further away from the door and trying to get more steps; job is sedentary, but she is finding ways to get up and get steps even at work; more thoughtful about activity and eating in general  Lab Results  Component Value Date   HGBA1C 6.9 (A) 04/09/2018   Lab Results  Component Value Date   CHOL 196 02/02/2018   HDL 39 (L) 02/02/2018   LDLCALC 123 (H) 02/02/2018   TRIG 224 (H) 02/02/2018   CHOLHDL 5.0 (H) 02/02/2018     Depression screen [facility] 2/9 06/30/2018 05/11/2018 04/09/2018 02/02/2018 09/04/2017  Decreased Interest 0 0 0 0 0  Down, Depressed, Hopeless 0 0 0 0 0  PHQ - 2  Score 0 0 0 0 0    Relevant past medical, surgical, family and social history reviewed Past Medical History:  Diagnosis Date  . Asthma   . Chronic hip pain   . Diabetes mellitus without complication (HCC)   . Hypertension   . Migraines   . Morbid obesity (HCC) 03/17/2016  . Sleep apnea    Past Surgical History:  Procedure Laterality Date  . CESAREAN SECTION     2  . CHOLECYSTECTOMY    . COLON SURGERY     part of bowel removed due to c-section   Family History  Problem Relation Age of Onset  . Diabetes Mother   . Cancer Mother        femal organs  . Hypoparathyroidism Mother   . Asthma Mother   . Diabetes Father   . Juvenile idiopathic arthritis Daughter   . Diabetes Maternal Grandmother   . Diabetes Maternal Grandfather   . Leukemia Paternal Grandmother   . Aneurysm Paternal Grandfather    Social History   Tobacco Use  . Smoking status: Never Smoker  . Smokeless tobacco: Never Used  Substance Use Topics  . Alcohol use: Yes    Alcohol/week: 0.0 standard drinks    Comment: wine - 2 drinks per month  .  Drug use: No    Interim medical history since last visit reviewed. Allergies and medications reviewed  Review of Systems Per HPI unless specifically indicated above     Objective:    BP 114/84   Pulse (!) 115   Temp 98.6 F (37 C)   Ht 5\' 3"  (1.6 m)   Wt (!) 393 lb 8 oz (178.5 kg)   LMP 05/04/2018   SpO2 94%   BMI 69.71 kg/m   Wt Readings from Last 3 Encounters:  06/30/18 (!) 393 lb 8 oz (178.5 kg)  06/25/18 (!) 392 lb 3.2 oz (177.9 kg)  05/11/18 (!) 393 lb 9.6 oz (178.5 kg)    Physical Exam  Constitutional: She appears well-developed and well-nourished. No distress.  Morbidly obese  HENT:  Head: Normocephalic and atraumatic.  Eyes: EOM are normal. No scleral icterus.  Neck: No thyromegaly present.  Cardiovascular: Normal rate, regular rhythm and normal heart sounds.  No murmur heard. Heart rate LESS than 100 bpm during auscultation    Pulmonary/Chest: Effort normal and breath sounds normal. No respiratory distress. She has no wheezes.  Abdominal: Soft. Bowel sounds are normal. She exhibits no distension.  Musculoskeletal: She exhibits no edema.  Neurological: She is alert.  Skin: Skin is warm and dry. She is not diaphoretic. No pallor.  Psychiatric: She has a normal mood and affect. Her behavior is normal. Judgment and thought content normal. Her mood appears not anxious. She does not exhibit a depressed mood.  Very pleasant, good eye contact with examiner   Diabetic Foot Form - Detailed   Diabetic Foot Exam - detailed Diabetic Foot exam was performed with the following findings:  Yes 06/30/2018  6:04 PM  Visual Foot Exam completed.:  Yes  Pulse Foot Exam completed.:  Yes  Right Dorsalis Pedis:  Present Left Dorsalis Pedis:  Present  Sensory Foot Exam Completed.:  Yes Semmes-Weinstein Monofilament Test R Site 1-Great Toe:  Pos L Site 1-Great Toe:  Pos        Results for orders placed or performed in visit on 04/30/18  Microalbumin / creatinine urine ratio  Result Value Ref Range   Creatinine, Urine 140 20 - 275 mg/dL   Microalb, Ur 4.9 mg/dL   Microalb Creat Ratio 35 (H) <30 mcg/mg creat      Assessment & Plan:   Problem List Items Addressed This Visit      Cardiovascular and Mediastinum   Essential hypertension, benign (Chronic)    Confirmed that patient cannot get pregnant, would not want her pregnant on ACE-I; she confirms      Relevant Medications   lisinopril-hydrochlorothiazide (PRINZIDE,ZESTORETIC) 10-12.5 MG tablet     Endocrine   Microalbuminuria due to type 2 diabetes mellitus (HCC)    She is now on an ACE-I; s/p tubal, patient verifies that she cannot get pregnant and was made aware that she should not get pregnant while taking this medicine      Relevant Medications   lisinopril-hydrochlorothiazide (PRINZIDE,ZESTORETIC) 10-12.5 MG tablet   Diabetes mellitus type 2 in obese (HCC) -  Primary (Chronic)    Encouragement given; I believe that with significant weight loss, her diabetes may be reversed; we are both hopeful that her bariatric surgery will be approved; healthy eating and mindfulness in both diet and activity discussed      Relevant Medications   lisinopril-hydrochlorothiazide (PRINZIDE,ZESTORETIC) 10-12.5 MG tablet     Other   Morbid obesity (HCC) (Chronic)    I am in full support of  her pursuing bariatric surgery; I believe significant weight loss >150 pounds is needed for her health; she now has diabetes and high triglycerides, in addition to obstructive sleep apnea; weight loss and significant reduction in her BMI should improve her overall health and reduce the risk of serious health problems in the future such as heart attack, heart failure, stroke, diabetic nephropathy, etc.       Other Visit Diagnoses    Sea sickness, initial encounter       Rx provided for scopolamine to use if needed while on cruise       Follow up plan: Return in about 10 days (around 07/10/2018) for fasting labs only; reschedule CPE for later in September.  An after-visit summary was printed and given to the patient at check-out.  Please see the patient instructions which may contain other information and recommendations beyond what is mentioned above in the assessment and plan.  Meds ordered this encounter  Medications  . scopolamine (TRANSDERM-SCOP, 1.5 MG,) 1 MG/3DAYS    Sig: Place 1 patch (1.5 mg total) onto the skin every 3 (three) days.    Dispense:  10 patch    Refill:  12  . lisinopril-hydrochlorothiazide (PRINZIDE,ZESTORETIC) 10-12.5 MG tablet    Sig: Take 1 tablet by mouth daily. If any plans for pregnancy, call doctor; do not take if you think you might be pregnant    Dispense:  90 tablet    Refill:  3    No orders of the defined types were placed in this encounter.  Face-to-face time with patient was more than 25 minutes, >50% time spent counseling and  coordination of care

## 2018-06-30 NOTE — Telephone Encounter (Signed)
Already prescribed; she is s/p tubal

## 2018-06-30 NOTE — Patient Instructions (Signed)
Keep up the great job with your efforts at eating healthier Return for fasting labs only on or after August 23rd, and then we'll see you for your physical in September

## 2018-07-01 DIAGNOSIS — R809 Proteinuria, unspecified: Secondary | ICD-10-CM

## 2018-07-01 DIAGNOSIS — E1129 Type 2 diabetes mellitus with other diabetic kidney complication: Secondary | ICD-10-CM | POA: Insufficient documentation

## 2018-07-01 NOTE — Assessment & Plan Note (Signed)
She is now on an ACE-I; s/p tubal, patient verifies that she cannot get pregnant and was made aware that she should not get pregnant while taking this medicine

## 2018-07-01 NOTE — Assessment & Plan Note (Signed)
I am in full support of her pursuing bariatric surgery; I believe significant weight loss >150 pounds is needed for her health; she now has diabetes and high triglycerides, in addition to obstructive sleep apnea; weight loss and significant reduction in her BMI should improve her overall health and reduce the risk of serious health problems in the future such as heart attack, heart failure, stroke, diabetic nephropathy, etc.

## 2018-07-01 NOTE — Assessment & Plan Note (Signed)
Confirmed that patient cannot get pregnant, would not want her pregnant on ACE-I; she confirms

## 2018-07-01 NOTE — Assessment & Plan Note (Signed)
Encouragement given; I believe that with significant weight loss, her diabetes may be reversed; we are both hopeful that her bariatric surgery will be approved; healthy eating and mindfulness in both diet and activity discussed

## 2018-07-02 ENCOUNTER — Encounter: Payer: Self-pay | Admitting: Dietician

## 2018-07-02 ENCOUNTER — Encounter: Payer: 59 | Admitting: Dietician

## 2018-07-02 VITALS — Wt 389.2 lb

## 2018-07-02 DIAGNOSIS — E119 Type 2 diabetes mellitus without complications: Secondary | ICD-10-CM

## 2018-07-02 DIAGNOSIS — Z713 Dietary counseling and surveillance: Secondary | ICD-10-CM | POA: Diagnosis not present

## 2018-07-02 NOTE — Progress Notes (Signed)
Appt. Start Time: 9:00am Appt. End Time: 12:00  Class 2 Nutritional Management - identify sources of carbohydrate, protein and fat; plan balanced meals; estimate servings of carbohydrates in meals  Psychosocial - identify DM as a source of stress; state the effects of stress on BG control  Exercise - describe the effects of exercise on blood glucose and importance of regular exercise in controlling diabetes; state a plan for personal exercise; verbalize contraindications for exercise  Self-Monitoring - state importance of SMBG; use SMBG results to effectively manage diabetes; identify importance of regular HbA1C testing and goals for results  Acute Complications - recognize hyperglycemia and hypoglycemia with causes and effects; identify blood glucose results as high, low or in control; list steps in treating and preventing high and low blood glucose  Sick Day Guidelines: state appropriate measure to manage blood glucose when ill (need for meds, HBGM plan, when to call physician, need for fluids)  Chronic Complications/Foot, Skin, Eye Dental Care - identify possible long-term complications of diabetes (retinopathy, neuropathy, nephropathy, cardiovascular disease, infections); explain steps in prevention and treatment of chronic complications; state importance of daily self-foot exams; describe how to examine feet and what to look for; explain appropriate eye and dental care  Lifestyle Changes/Goals - state benefits of making appropriate lifestyle changes; identify habits that need to change (meals, tobacco, alcohol); identify strategies to reduce risk factors for personal health  Pregnancy/Sexual Health - state importance of good blood glucose control in preventing sexual problems (impotence, vaginal dryness, infections, loss of desire)  Teaching Materials Used: Class 2 Slide Packet A1C Pamphlet Foot Care Literature Kidney Test Handout Stroke Card Quick and "Balanced" Meal Ideas Carb  Counting and Meal Planning Book Goals for Class 2

## 2018-07-07 ENCOUNTER — Encounter: Payer: 59 | Admitting: Family Medicine

## 2018-07-09 ENCOUNTER — Ambulatory Visit: Payer: 59

## 2018-07-13 ENCOUNTER — Encounter: Payer: Self-pay | Admitting: Nurse Practitioner

## 2018-07-13 ENCOUNTER — Ambulatory Visit (INDEPENDENT_AMBULATORY_CARE_PROVIDER_SITE_OTHER): Payer: 59 | Admitting: Nurse Practitioner

## 2018-07-13 ENCOUNTER — Other Ambulatory Visit (HOSPITAL_COMMUNITY)
Admission: RE | Admit: 2018-07-13 | Discharge: 2018-07-13 | Disposition: A | Discharge status: Home / Self Care | Payer: 59 | Source: Ambulatory Visit | Attending: Family Medicine | Admitting: Family Medicine

## 2018-07-13 VITALS — BP 120/90 | HR 93 | Temp 98.6°F | Resp 16 | Ht 62.6 in | Wt 393.0 lb

## 2018-07-13 DIAGNOSIS — N926 Irregular menstruation, unspecified: Secondary | ICD-10-CM | POA: Diagnosis not present

## 2018-07-13 DIAGNOSIS — E1169 Type 2 diabetes mellitus with other specified complication: Secondary | ICD-10-CM

## 2018-07-13 DIAGNOSIS — R748 Abnormal levels of other serum enzymes: Secondary | ICD-10-CM

## 2018-07-13 DIAGNOSIS — Z124 Encounter for screening for malignant neoplasm of cervix: Secondary | ICD-10-CM | POA: Insufficient documentation

## 2018-07-13 DIAGNOSIS — E781 Pure hyperglyceridemia: Secondary | ICD-10-CM

## 2018-07-13 DIAGNOSIS — Z8261 Family history of arthritis: Secondary | ICD-10-CM

## 2018-07-13 DIAGNOSIS — M25551 Pain in right hip: Secondary | ICD-10-CM

## 2018-07-13 DIAGNOSIS — M25562 Pain in left knee: Secondary | ICD-10-CM

## 2018-07-13 DIAGNOSIS — M25561 Pain in right knee: Secondary | ICD-10-CM

## 2018-07-13 DIAGNOSIS — Z0000 Encounter for general adult medical examination without abnormal findings: Secondary | ICD-10-CM | POA: Diagnosis not present

## 2018-07-13 DIAGNOSIS — E669 Obesity, unspecified: Secondary | ICD-10-CM

## 2018-07-13 LAB — POCT URINE PREGNANCY: Preg Test, Ur: NEGATIVE

## 2018-07-13 NOTE — Patient Instructions (Signed)
Recommend slowly increasing activity, eat two servings of fish weekly, eat one serving of tree nuts ( cashews, pistachios, pecans, almonds.Marland Kitchen.) every other day, eat 6 servings of fruit/vegetables daily and drink plenty of water and avoid sweet beverages.

## 2018-07-13 NOTE — Progress Notes (Signed)
Name: Tanya Reeves   MRN: [id number]    DOB: 23-Jun-1986   Date:07/13/2018       Progress Note  Subjective  Chief Complaint  Chief Complaint  Patient presents with  . Annual Exam    HPI  Patient presents for annual CPE. Daughter was dx with rheumatoid arthritis and she would like sed rate checked per recommendation of specialist. Does note some joint pains in the past she has attributed to her weight. Denies pain presently.   Diet:  Cut out sodas, drinks water and unsweet tea. 3 meals a day and snacks. Was doing 2 meals a day but is in nutritional classes- making better choices.  Breakfast- a biscuit and meat (ham or bacon), occasionally hard boiled egg Lunch: sandwich, meat and crackers, plain noodles Dinner: meat and two sides.  Vegetables a day: 2-3 Fruits: maybe one.   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; ADL's parking further away from the building ( in the last 6 weeks)   USPSTF grade A and B recommendations    Office Visit from 06/30/2018 in [facility]  AUDIT-C Score  1     Depression:  Depression screen [facility] 2/9 06/30/2018 05/11/2018 04/09/2018 02/02/2018 09/04/2017  Decreased Interest 0 0 0 0 0  Down, Depressed, Hopeless 0 0 0 0 0  PHQ - 2 Score 0 0 0 0 0   Hypertension: BP Readings from Last 3 Encounters:  07/13/18 120/90  06/30/18 114/84  05/11/18 122/76   Obesity: Wt Readings from Last 3 Encounters:  07/13/18 (!) 393 lb (178.3 kg)  07/02/18 (!) 389 lb 3.2 oz (176.5 kg)  06/30/18 (!) 393 lb 8 oz (178.5 kg)   BMI Readings from Last 3 Encounters:  07/13/18 70.51 kg/m  07/02/18 68.94 kg/m  06/30/18 69.71 kg/m    Hep C Screening: declines  STD testing and prevention (HIV/chl/gon/syphilis): declines; monogamous relationship  Intimate partner violence: denies Sexual History/Pain during Intercourse: denies  Menstrual History/LMP/Abnormal Bleeding: denies- hasn't had period in 2 month; has hx of irregular periods.  Incontinence  Symptoms: denies   Advanced Care Planning: A voluntary discussion about advance care planning including the explanation and discussion of advance directives.  Discussed health care proxy and Living will, and the patient was able to identify a health care proxy as husband Russie Gulledge.  Patient does not have a living will at present time. If patient does have living will, I have requested they bring this to the clinic to be scanned in to their chart.  Breast cancer: 2 maternal aunts and maternal grandmother has history of breast cancer Cervical cancer screening: due today.   Lipids:  Lab Results  Component Value Date   CHOL 196 02/02/2018   CHOL 192 07/18/2017   CHOL 209 (H) 10/02/2016   Lab Results  Component Value Date   HDL 39 (L) 02/02/2018   HDL 41 (L) 07/18/2017   HDL 43 (L) 10/02/2016   Lab Results  Component Value Date   LDLCALC 123 (H) 02/02/2018   LDLCALC 112 (H) 07/18/2017   LDLCALC 132 (H) 10/02/2016   Lab Results  Component Value Date   TRIG 224 (H) 02/02/2018   TRIG 195 (H) 07/18/2017   TRIG 171 (H) 10/02/2016   Lab Results  Component Value Date   CHOLHDL 5.0 (H) 02/02/2018   CHOLHDL 4.7 07/18/2017   CHOLHDL 4.9 10/02/2016   No results found for: LDLDIRECT  Glucose:  Glucose  Date Value Ref Range Status  12/10/2014 83 65 -  99 mg/dL Final  04/10/2013 123 (H) 65 - 99 mg/dL Final   Glucose, Bld  Date Value Ref Range Status  02/02/2018 174 (H) 65 - 139 mg/dL Final    Comment:    .        Non-fasting reference interval .   07/18/2017 98 65 - 99 mg/dL Final  10/02/2016 101 (H) 65 - 99 mg/dL Final    Skin cancer: father had skin cancer, has regular check ups with dermatology (last check 2 weeks ago)  Colorectal cancer: no rectal bleeding  Lung cancer: No history of smoking  Low Dose CT Chest recommended if Age 32-80 years, 30 pack-year currently smoking OR have quit w/in 15years. Patient does not qualify.     Patient Active Problem List    Diagnosis Date Noted  . Microalbuminuria due to type 2 diabetes mellitus ([place]) 07/01/2018  . Urine test positive for microalbuminuria 05/01/2018  . Asthma 07/18/2017  . Heavy periods 07/18/2017  . Hidradenitis suppurativa 03/24/2017  . Chronic sinusitis 10/02/2016  . High triglycerides 10/02/2016  . Shortness of breath on exertion 08/26/2016  . Morbid obesity ([place]) 03/17/2016  . Essential hypertension, benign 02/26/2016  . Migraine without aura 02/26/2016  . OSA on CPAP 02/26/2016  . Medication monitoring encounter 02/26/2016  . Diabetes mellitus type 2 in obese ([place]) 02/27/2015  . Pre-existing hypertension during pregnancy in third trimester 09/23/2014  . Left ventricular hypertrophy 08/04/2014  . Previous cesarean delivery affecting pregnancy 07/07/2014    Past Surgical History:  Procedure Laterality Date  . CESAREAN SECTION     2  . CHOLECYSTECTOMY    . COLON SURGERY     part of bowel removed due to c-section    Family History  Problem Relation Age of Onset  . Diabetes Mother   . Cancer Mother        femal organs  . Hypoparathyroidism Mother   . Asthma Mother   . Diabetes Father   . Juvenile idiopathic arthritis Daughter   . Diabetes Maternal Grandmother   . Diabetes Maternal Grandfather   . Leukemia Paternal Grandmother   . Aneurysm Paternal Grandfather     Social History   Socioeconomic History  . Marital status: Married    Spouse name: Christia Reading   . Number of children: 2  . Years of education: Not on file  . Highest education level: Associate degree: academic program  Occupational History  . Not on file  Social Needs  . Financial resource strain: Not hard at all  . Food insecurity:    Worry: Never true    Inability: Never true  . Transportation needs:    Medical: No    Non-medical: No  Tobacco Use  . Smoking status: Never Smoker  . Smokeless tobacco: Never Used  Substance and Sexual Activity  . Alcohol use: Yes    Alcohol/week: 0.0 standard  drinks    Comment: wine - 2 drinks per month  . Drug use: No  . Sexual activity: Yes    Birth control/protection: Surgical    Comment: Tubal Ligation   Lifestyle  . Physical activity:    Days per week: 0 days    Minutes per session: 0 min  . Stress: Not at all  Relationships  . Social connections:    Talks on phone: More than three times a week    Gets together: More than three times a week    Attends religious service: More than 4 times per year    Active  member of club or organization: Yes    Attends meetings of clubs or organizations: More than 4 times per year    Relationship status: Married  . Intimate partner violence:    Fear of current or ex partner: No    Emotionally abused: No    Physically abused: Not on file    Forced sexual activity: No  Other Topics Concern  . Not on file  Social History Narrative  . Not on file     Current Outpatient Medications:  .  albuterol (PROAIR HFA) 108 (90 Base) MCG/ACT inhaler, Inhale 2 puffs into the lungs every 4 (four) hours as needed., Disp: 1 Inhaler, Rfl: 2 .  albuterol (PROVENTIL) (2.5 MG/3ML) 0.083% nebulizer solution, Take 3 mLs (2.5 mg total) by nebulization every 6 (six) hours as needed for wheezing or shortness of breath., Disp: 150 mL, Rfl: 1 .  budesonide-formoterol (SYMBICORT) 160-4.5 MCG/ACT inhaler, Inhale 2 puffs into the lungs 2 (two) times daily., Disp: 1 Inhaler, Rfl: 3 .  diphenhydrAMINE (BENADRYL) 50 MG capsule, Take 50 mg by mouth every 4 (four) hours as needed., Disp: , Rfl:  .  eletriptan (RELPAX) 20 MG tablet, TAKE 1 TABLET BY MOUTH AS NEEDED FOR MIGRAINE OR HEADACHE. MAY REPEAT IN 2 HOURS IF HEADACHE PERSISTS OR RECURS, Disp: 6 tablet, Rfl: 2 .  fluticasone (FLONASE) 50 MCG/ACT nasal spray, Place 2 sprays into both nostrils daily as needed. , Disp: , Rfl:  .  glucose blood test strip, Check blood sugar once a day DX:E11.65 LON:99, Disp: 100 each, Rfl: 3 .  Lancets (ONETOUCH ULTRASOFT) lancets, Check blood  sugar once a day DX:E11.65 LON:99, Disp: 100 each, Rfl: 3 .  levocetirizine (XYZAL) 5 MG tablet, Take 5 mg by mouth every evening., Disp: , Rfl:  .  lisinopril-hydrochlorothiazide (PRINZIDE,ZESTORETIC) 10-12.5 MG tablet, Take 1 tablet by mouth daily. If any plans for pregnancy, call doctor; do not take if you think you might be pregnant, Disp: 90 tablet, Rfl: 3 .  metFORMIN (GLUCOPHAGE-XR) 500 MG 24 hr tablet, TAKE 1 TABLET BY MOUTH DAILY FOR 5 DAYS, 2 TABLETS DAILY FOR 5 DAYS, 3 TABLETS DAILY FOR 5 DAYS, THEN 4 TABLETS DAILY, Disp: 315 tablet, Rfl: 0 .  montelukast (SINGULAIR) 10 MG tablet, Take 10 mg by mouth daily as needed. , Disp: , Rfl:  .  nystatin cream (MYCOSTATIN), APPLY EXTERNALLY TO THE AFFECTED AREA TWICE DAILY AS NEEDED, Disp: 30 g, Rfl: 3 .  ranitidine (ZANTAC) 150 MG tablet, Take 2 tablets (300 mg total) by mouth at bedtime as needed., Disp: , Rfl:  .  scopolamine (TRANSDERM-SCOP, 1.5 MG,) 1 MG/3DAYS, Place 1 patch (1.5 mg total) onto the skin every 3 (three) days. (Patient not taking: Reported on 07/13/2018), Disp: 10 patch, Rfl: 12  Allergies  Allergen Reactions  . Cinnamon Other (See Comments)    Tongue swells up. With artificial (such as in gum/red hots)  . Dm-Guaifenesin Er      Review of Systems  Constitutional: Negative for chills, fever and malaise/fatigue.  HENT: Negative for congestion and sore throat.   Eyes: Negative for blurred vision and double vision.  Respiratory: Negative for cough and shortness of breath.   Cardiovascular: Negative for chest pain and palpitations.  Gastrointestinal: Positive for nausea (has started metformin). Negative for abdominal pain, constipation, diarrhea and vomiting.  Genitourinary: Negative for dysuria and hematuria.  Musculoskeletal: Negative for falls and myalgias.  Skin: Negative for rash.  Neurological: Positive for dizziness (intermittent, notices it when her  blood sugar is lower for her under 111). Negative for tingling and  headaches.  Psychiatric/Behavioral: Negative for depression. The patient is not nervous/anxious and does not have insomnia.       Objective  Vitals:   07/13/18 1123  BP: 120/90  Pulse: 93  Resp: 16  Temp: 98.6 F (37 C)  TempSrc: Oral  SpO2: 96%  Weight: (!) 393 lb (178.3 kg)  Height: 5' 2.6" (1.59 m)    Body mass index is 70.51 kg/m.  Physical Exam  Constitutional: She appears well-developed and well-nourished. She is cooperative.  Obese   HENT:  Head: Normocephalic and atraumatic.  Right Ear: Hearing, external ear and ear canal normal. Tympanic membrane is scarred. Tympanic membrane is not injected.  Left Ear: Hearing, external ear and ear canal normal. Tympanic membrane is scarred. Tympanic membrane is not injected.  Nose: Right sinus exhibits no maxillary sinus tenderness and no frontal sinus tenderness. Left sinus exhibits no maxillary sinus tenderness and no frontal sinus tenderness.  Mouth/Throat: Oropharynx is clear and moist. No oropharyngeal exudate.  Eyes: Pupils are equal, round, and reactive to light. Conjunctivae and EOM are normal.  Neck: Trachea normal. No JVD present. No thyroid mass present.  Cardiovascular: Normal rate, regular rhythm and intact distal pulses.  Pulmonary/Chest: Effort normal and breath sounds normal. No respiratory distress. She exhibits no mass. Right breast exhibits mass. Right breast exhibits no inverted nipple, no nipple discharge, no skin change and no tenderness. Left breast exhibits mass. Left breast exhibits no inverted nipple, no nipple discharge, no skin change and no tenderness.    Abdominal: Soft. Bowel sounds are normal. There is no tenderness.  Genitourinary: Rectum normal and vagina normal. Rectal exam shows no external hemorrhoid and no tenderness. Pelvic exam was performed with patient prone. There is no rash, tenderness, lesion or injury on the right labia. There is no rash, tenderness, lesion or injury on the left labia.  Cervix exhibits friability (no pain but bleeding noted with broom). Cervix exhibits no motion tenderness and no discharge. No erythema, tenderness or bleeding in the vagina. No foreign body in the vagina. No signs of injury around the vagina. No vaginal discharge found.  Musculoskeletal: Normal range of motion. She exhibits no edema or deformity.  Neurological: She is alert. She has normal strength. No cranial nerve deficit or sensory deficit. GCS eye subscore is 4. GCS verbal subscore is 5. GCS motor subscore is 6.  Skin: Skin is warm, dry and intact. Capillary refill takes less than 2 seconds. No rash noted.     Psychiatric: She has a normal mood and affect. Her behavior is normal. Judgment and thought content normal.    Recent Results (from the past 2160 hour(s))  Microalbumin / creatinine urine ratio     Status: Abnormal   Collection Time: 04/30/18 11:02 AM  Result Value Ref Range   Creatinine, Urine 140 20 - 275 mg/dL   Microalb, Ur 4.9 mg/dL    Comment: Reference Range Not established    Microalb Creat Ratio 35 (H) <30 mcg/mg creat    Comment: . The ADA defines abnormalities in albumin excretion as follows: Marland Kitchen Category         Result (mcg/mg creatinine) . Normal                    <30 Microalbuminuria         30-299  Clinical albuminuria   > OR = 300 . The ADA recommends that at least  two of three specimens collected within a 3-6 month period be abnormal before considering a patient to be within a diagnostic category.     Diabetic Foot Exam: Diabetic Foot Exam - Simple   Simple Foot Form Diabetic Foot exam was performed with the following findings:  Yes 07/13/2018 12:24 PM  Visual Inspection No deformities, no ulcerations, no other skin breakdown bilaterally:  Yes Sensation Testing Intact to touch and monofilament testing bilaterally:  Yes Pulse Check Posterior Tibialis and Dorsalis pulse intact bilaterally:  Yes Comments      Fall Risk: Fall Risk  07/13/2018  07/02/2018 06/30/2018 06/25/2018 05/11/2018  Falls in the past year? No (No Data) No No No  Comment - no falls since previous visit - - -     Functional Status Survey: Is the patient deaf or have difficulty hearing?: No Does the patient have difficulty seeing, even when wearing glasses/contacts?: No Does the patient have difficulty concentrating, remembering, or making decisions?: No Does the patient have difficulty walking or climbing stairs?: No Does the patient have difficulty dressing or bathing?: No Does the patient have difficulty doing errands alone such as visiting a doctor's office or shopping?: No   Assessment & Plan  1. General medical exam Discussed diet, weight loss, encouraged healthy changes made and slowly progress activity and increasing fruits and vegetables. Discussed screening exams.   2. Irregular periods - TSH - POCT urine pregnancy  3. Family history of rheumatoid arthritis Daughter +  - Sed Rate (ESR)  4. Arthralgia of both knees - likely due to weight but would like to check as daughter had rheumatoid and specialist recommended  - Sed Rate (ESR)  5. Arthralgia of right hip - likely due to weight but would like to check as daughter had rheumatoid and specialist recommended  - Sed Rate (ESR)  6. Diabetes mellitus type 2 in obese ([place]) Discussed diet and weight loss; continue metformin regularly if able to tolerate - HgB A1c  7. Screening for cervical cancer - Cytology - PAP  8. Missed period Tubes tied, unlikely but precautionary  - TSH - POCT urine pregnancy  9. Morbid obesity ([place]) Discussed diet and weight loss  10. High triglycerides Discussed diet and weight loss  11. Elevated liver enzymes - COMPLETE METABOLIC PANEL WITH GFR  -USPSTF grade A and B recommendations reviewed with patient; age-appropriate recommendations, preventive care, screening tests, etc discussed and encouraged; healthy living encouraged; see AVS for patient education  given to patient -Discussed importance of 150 minutes of physical activity weekly, eat two servings of fish weekly, eat one serving of tree nuts ( cashews, pistachios, pecans, almonds.Marland Kitchen) every other day, eat 6 servings of fruit/vegetables daily and drink plenty of water and avoid sweet beverages.   -Reviewed Health Maintenance: pap today; eye appointment scheduled.

## 2018-07-14 LAB — CYTOLOGY - PAP: Diagnosis: NEGATIVE

## 2018-07-14 LAB — COMPLETE METABOLIC PANEL WITH GFR
AG RATIO: 1.3 (calc) (ref 1.0–2.5)
ALT: 61 U/L — AB (ref 6–29)
AST: 66 U/L — ABNORMAL HIGH (ref 10–30)
Albumin: 4.3 g/dL (ref 3.6–5.1)
Alkaline phosphatase (APISO): 47 U/L (ref 33–115)
BUN: 8 mg/dL (ref 7–25)
CALCIUM: 9.9 mg/dL (ref 8.6–10.2)
CO2: 26 mmol/L (ref 20–32)
CREATININE: 0.58 mg/dL (ref 0.50–1.10)
Chloride: 100 mmol/L (ref 98–110)
GFR, EST AFRICAN AMERICAN: 139 mL/min/{1.73_m2} (ref >=60)
GFR, EST NON AFRICAN AMERICAN: 120 mL/min/{1.73_m2} (ref >=60)
GLOBULIN: 3.3 g/dL (ref 1.9–3.7)
Glucose, Bld: 109 mg/dL (ref 65–139)
Potassium: 3.9 mmol/L (ref 3.5–5.3)
SODIUM: 137 mmol/L (ref 135–146)
TOTAL PROTEIN: 7.6 g/dL (ref 6.1–8.1)
Total Bilirubin: 0.4 mg/dL (ref 0.2–1.2)

## 2018-07-14 LAB — HEMOGLOBIN A1C
EAG (MMOL/L): 8.7 (calc)
Hgb A1c MFr Bld: 7.1 % of total Hgb — ABNORMAL HIGH (ref <5.7)
MEAN PLASMA GLUCOSE: 157 (calc)

## 2018-07-14 LAB — TSH: TSH: 2.09 m[IU]/L

## 2018-07-14 LAB — SEDIMENTATION RATE: SED RATE: 29 mm/h — AB (ref 0–20)

## 2018-07-15 ENCOUNTER — Other Ambulatory Visit: Payer: Self-pay

## 2018-07-15 DIAGNOSIS — J4541 Moderate persistent asthma with (acute) exacerbation: Secondary | ICD-10-CM

## 2018-07-15 DIAGNOSIS — J301 Allergic rhinitis due to pollen: Secondary | ICD-10-CM

## 2018-08-06 ENCOUNTER — Encounter: Payer: 59 | Attending: Family Medicine | Admitting: Dietician

## 2018-08-06 ENCOUNTER — Encounter: Payer: Self-pay | Admitting: Dietician

## 2018-08-06 VITALS — Ht 63.0 in | Wt 388.3 lb

## 2018-08-06 DIAGNOSIS — Z6841 Body Mass Index (BMI) 40.0 and over, adult: Secondary | ICD-10-CM | POA: Diagnosis not present

## 2018-08-06 DIAGNOSIS — Z713 Dietary counseling and surveillance: Secondary | ICD-10-CM | POA: Insufficient documentation

## 2018-08-06 DIAGNOSIS — E1165 Type 2 diabetes mellitus with hyperglycemia: Secondary | ICD-10-CM | POA: Insufficient documentation

## 2018-08-06 DIAGNOSIS — E119 Type 2 diabetes mellitus without complications: Secondary | ICD-10-CM

## 2018-08-06 NOTE — Progress Notes (Signed)

## 2018-08-07 ENCOUNTER — Encounter: Payer: Self-pay | Admitting: *Deleted

## 2018-08-13 ENCOUNTER — Encounter: Payer: 59 | Admitting: Family Medicine

## 2018-08-26 DIAGNOSIS — K219 Gastro-esophageal reflux disease without esophagitis: Secondary | ICD-10-CM

## 2018-08-26 HISTORY — DX: Gastro-esophageal reflux disease without esophagitis: K21.9

## 2018-09-08 ENCOUNTER — Encounter: Payer: Self-pay | Admitting: Family Medicine

## 2018-09-09 NOTE — Telephone Encounter (Signed)
Please order labs on scanned document, future, she'll come in soon

## 2018-09-24 ENCOUNTER — Telehealth: Payer: Self-pay

## 2018-09-24 DIAGNOSIS — Z01818 Encounter for other preprocedural examination: Secondary | ICD-10-CM

## 2018-09-24 NOTE — Telephone Encounter (Signed)
Copied from CRM [id number]. Topic: General - Other >> Sep 24, 2018  9:21 AM Trula Slade wrote: Reason for CRM:  Patient stated that she talked to Dr. Sherie Don on my chart and she approved the following labs, but the patient would like the order put in the computer before her appt. The diagnosis for the labs is E66.01, once these labs are drawn they need to be faxed to [phone].  Patient would like to know where Dr. Sherie Don would like for the patient to have these labs drawn.  Please advise:  1) H-Pylori IGG antibody 2) Nicotine Metabolite urine ([place] [address]) 3) Comprehensive drug screen - (Reference lab LC 322025) 4) Lipid Panel  5) Iron 6) PTH Intact 7) Zolpicem Urine (Reference Lab LC 081100) 8) [facility] drug screen 9) Copper Serum (Reference Lab LC 427062) 10) Zinc  (Reference Lab LC 001800) 11) Vit K1,  (Reference Lab LC 121200) 12) Vit E (Reference Lab LC 070140) 13) Vit A Serum  (Lab LC 017509) 14) CBC w/Platelet and Diff 15) TSH  16) CMP 17) Ferricin  18) Vit B12, 19) Folate 20) Vit B1 Whole Blood  (Reference Lab LC 376283) 21) Vit D25 Hydroxy  22) Magnesium Level 23) Hemoglobin A1C

## 2018-09-24 NOTE — Telephone Encounter (Signed)
Yes, please order but I have no idea what zolpicem means or what they are looking for; if any question, contact the bariatric surgery office  Any lab station is fine with me  Make sure the lab knows to send the results to the bariatric surgeon who wants these labs so they will be responsible for the test results

## 2018-09-27 ENCOUNTER — Other Ambulatory Visit: Payer: Self-pay | Admitting: Family Medicine

## 2018-09-27 MED ORDER — VITAMIN D (ERGOCALCIFEROL) 1.25 MG (50000 UNIT) PO CAPS: 50000.0000 [IU] | ORAL_CAPSULE | ORAL | 1 refills | Status: DC

## 2018-09-27 NOTE — Progress Notes (Signed)
Rx for 50k vit D

## 2018-10-01 LAB — CBC WITH DIFFERENTIAL/PLATELET
BASOS ABS: 50 {cells}/uL (ref 0–200)
BASOS PCT: 0.7 %
EOS ABS: 291 {cells}/uL (ref 15–500)
Eosinophils Relative: 4.1 %
HCT: 36.2 % (ref 35.0–45.0)
HEMOGLOBIN: 12.2 g/dL (ref 11.7–15.5)
Lymphs Abs: 2450 cells/uL (ref 850–3900)
MCH: 28.1 pg (ref 27.0–33.0)
MCHC: 33.7 g/dL (ref 32.0–36.0)
MCV: 83.4 fL (ref 80.0–100.0)
MONOS PCT: 6.9 %
MPV: 11.2 fL (ref 7.5–12.5)
Neutro Abs: 3820 cells/uL (ref 1500–7800)
Neutrophils Relative %: 53.8 %
PLATELETS: 230 10*3/uL (ref 140–400)
RBC: 4.34 10*6/uL (ref 3.80–5.10)
RDW: 14 % (ref 11.0–15.0)
TOTAL LYMPHOCYTE: 34.5 %
WBC mixed population: 490 cells/uL (ref 200–950)
WBC: 7.1 10*3/uL (ref 3.8–10.8)

## 2018-10-01 LAB — TSH: TSH: 2.27 mIU/L

## 2018-10-01 LAB — COMPREHENSIVE METABOLIC PANEL
AG RATIO: 1.4 (calc) (ref 1.0–2.5)
ALT: 63 U/L — AB (ref 6–29)
AST: 77 U/L — AB (ref 10–30)
Albumin: 4.1 g/dL (ref 3.6–5.1)
Alkaline phosphatase (APISO): 46 U/L (ref 33–115)
BUN / CREAT RATIO: 22 (calc) (ref 6–22)
BUN: 10 mg/dL (ref 7–25)
CALCIUM: 9.2 mg/dL (ref 8.6–10.2)
CO2: 23 mmol/L (ref 20–32)
Chloride: 100 mmol/L (ref 98–110)
Creat: 0.46 mg/dL — ABNORMAL LOW (ref 0.50–1.10)
GLUCOSE: 111 mg/dL — AB (ref 65–99)
Globulin: 2.9 g/dL (calc) (ref 1.9–3.7)
Potassium: 3.9 mmol/L (ref 3.5–5.3)
Sodium: 134 mmol/L — ABNORMAL LOW (ref 135–146)
Total Bilirubin: 0.5 mg/dL (ref 0.2–1.2)
Total Protein: 7 g/dL (ref 6.1–8.1)

## 2018-10-01 LAB — NICOTINE/COTININE SP: Cotinine: <2 ng/mL

## 2018-10-01 LAB — PTH, INTACT AND CALCIUM
Calcium: 9.2 mg/dL (ref 8.6–10.2)
PTH: 56 pg/mL (ref 14–64)

## 2018-10-01 LAB — COPPER, SERUM: Copper: 137 ug/dL (ref 70–175)

## 2018-10-01 LAB — FOLATE: FOLATE: 5.6 ng/mL

## 2018-10-01 LAB — VITAMIN A: VITAMIN A (RETINOIC ACID): 42 ug/dL (ref 38–98)

## 2018-10-01 LAB — VITAMIN B12: Vitamin B-12: 560 pg/mL (ref 200–1100)

## 2018-10-01 LAB — H. PYLORI BREATH TEST: H. pylori Breath Test: NOT DETECTED

## 2018-10-01 LAB — LIPID PANEL
Cholesterol: 189 mg/dL (ref <200)
HDL: 39 mg/dL — AB (ref >50)
LDL Cholesterol (Calc): 122 mg/dL (calc) — ABNORMAL HIGH
NON-HDL CHOLESTEROL (CALC): 150 mg/dL — AB (ref <130)
Total CHOL/HDL Ratio: 4.8 (calc) (ref <5.0)
Triglycerides: 161 mg/dL — ABNORMAL HIGH (ref <150)

## 2018-10-01 LAB — VITAMIN K1, SERUM: VITAMIN K: 764 pg/mL (ref 130–1500)

## 2018-10-01 LAB — VITAMIN E
GAMMA-TOCOPHEROL (VIT E): 2.7 mg/L (ref <=4.3)
VITAMIN E (ALPHA TOCOPHEROL): 9.4 mg/L (ref 5.7–19.9)

## 2018-10-01 LAB — MAGNESIUM: MAGNESIUM: 1.7 mg/dL (ref 1.5–2.5)

## 2018-10-01 LAB — IRON: Iron: 79 ug/dL (ref 40–190)

## 2018-10-01 LAB — ZINC: Zinc: 78 ug/dL (ref 60–130)

## 2018-10-01 LAB — VITAMIN D 25 HYDROXY (VIT D DEFICIENCY, FRACTURES): Vit D, 25-Hydroxy: 13 ng/mL — ABNORMAL LOW (ref 30–100)

## 2018-10-01 LAB — FERRITIN: Ferritin: 184 ng/mL — ABNORMAL HIGH (ref 16–154)

## 2018-10-01 LAB — VITAMIN B1, WHOLE BLOOD: VITAMIN B1 (THIAMINE), BLOOD: 96 nmol/L (ref 78–185)

## 2018-10-01 LAB — HEMOGLOBIN A1C
EAG (MMOL/L): 8.7 (calc)
Hgb A1c MFr Bld: 7.1 % of total Hgb — ABNORMAL HIGH (ref <5.7)
Mean Plasma Glucose: 157 (calc)

## 2018-10-02 LAB — DRUG SCREEN, CLINICAL 1, WITHOUT CONFIRMATION, URINE: URINE RESULTS: NOT DETECTED

## 2018-10-02 LAB — TEST AUTHORIZATION

## 2018-10-02 LAB — DRUG SCREEN, COMPREHENSIVE, WITH CONFIRMATION, URINE

## 2018-10-09 ENCOUNTER — Encounter: Payer: Self-pay | Admitting: Family Medicine

## 2018-10-13 ENCOUNTER — Ambulatory Visit: Payer: 59 | Admitting: Family Medicine

## 2018-10-19 ENCOUNTER — Telehealth: Payer: Self-pay

## 2018-10-19 ENCOUNTER — Encounter: Payer: Self-pay | Admitting: Family Medicine

## 2018-10-19 DIAGNOSIS — R9431 Abnormal electrocardiogram [ECG] [EKG]: Secondary | ICD-10-CM

## 2018-10-19 NOTE — Telephone Encounter (Signed)
Copied from CRM [id number]. Topic: General - Other >> Oct 19, 2018  3:50 PM Jay SchlichterWeikart, [name] J wrote: Reason for CRM: pt is planning on bariatric surgery.  She had an abnormal ekg, a cardio referral is needed.  Pt has appt scheduled at heart care this Thurs and needs referral.   Thanks

## 2018-10-19 NOTE — Telephone Encounter (Signed)
Okay, please enter

## 2018-10-21 NOTE — Telephone Encounter (Signed)
fyi the referral that is needed for patient insurance needs to be [facility] the [facility] and then referral auth number obtained( instant per [facility])  needs be be faxed to [facility].  Patient Insurance will deny any and all claims without this referral number and it cannot be backdated.   Patient appt is on 12/5.  [facility]/2 and lm to check status this afternoon.

## 2018-10-22 ENCOUNTER — Telehealth: Payer: Self-pay | Admitting: Family Medicine

## 2018-10-22 ENCOUNTER — Encounter: Payer: Self-pay | Admitting: Cardiovascular Disease

## 2018-10-22 ENCOUNTER — Ambulatory Visit: Payer: Self-pay | Admitting: Cardiovascular Disease

## 2018-10-22 VITALS — BP 126/78 | HR 78 | Ht 63.0 in | Wt 384.5 lb

## 2018-10-22 DIAGNOSIS — I10 Essential (primary) hypertension: Secondary | ICD-10-CM

## 2018-10-22 DIAGNOSIS — Z01810 Encounter for preprocedural cardiovascular examination: Secondary | ICD-10-CM

## 2018-10-22 NOTE — Telephone Encounter (Signed)
Copied from CRM [id number]. Topic: General - Other >> Oct 22, 2018  2:55 PM Tamela OddiHarris, Maxemiliano Riel J wrote: Reason for CRM: Christin from Dr. Smitty CordsBruce office called to verify that the office had the correct fax # to send patient's lab results.  Agent called office and the fax # they had was incorrect.  Gave them the correct # and they stated that the results would be faxed today.

## 2018-10-22 NOTE — Progress Notes (Signed)
Cardiology Office Note   Date:  10/22/2018   ID:  Tanya Reeves, DOB 12/09/1985, MRN [id number]  PCP:  Kerman Passey, MD  Cardiologist:   Lorine Bears, MD   Chief Complaint  Patient presents with  . New Patient (Initial Visit)    Bariatric clinic referral. No chest pain No SOB.Medications reviewed verbally. Feels good.       History of Present Illness: Tanya Reeves is a 32 y.o. female who was referred by Dr. Smitty Cords for preoperative cardiovascular evaluation before anticipated bariatric surgery.  The patient has known history of morbid obesity, obstructive sleep apnea on CPAP, type 2 diabetes, hypertension and migraines.  She has been heavy almost all her life.  She had preeclampsia during her last pregnancy in 2015.  At that time, she underwent an echocardiogram at [facility] which showed normal LV systolic function with no significant valvular abnormalities. The patient had preop EKG recently that showed poor R wave progression in the anterior leads and thus she was referred for evaluation.  She has no prior cardiac history.  She denies any chest pain.  She has shortness of breath likely related to her obesity but denies any orthopnea, PND or leg edema.  She is able to do her housework overall with no significant limitations. She was diagnosed with mild type 2 diabetes earlier this year with hemoglobin A1c of 6.2. She is not a smoker and has no family history of coronary artery disease.    Past Medical History:  Diagnosis Date  . Asthma   . Chronic hip pain   . Diabetes mellitus without complication (HCC)   . Hypertension   . Migraines   . Morbid obesity (HCC) 03/17/2016  . Sleep apnea     Past Surgical History:  Procedure Laterality Date  . CESAREAN SECTION     2  . CHOLECYSTECTOMY    . COLON SURGERY     part of bowel removed due to c-section     Current Outpatient Medications  Medication Sig Dispense Refill  . albuterol (PROAIR HFA) 108 (90 Base) MCG/ACT  inhaler Inhale 2 puffs into the lungs every 4 (four) hours as needed. 1 Inhaler 2  . albuterol (PROVENTIL) (2.5 MG/3ML) 0.083% nebulizer solution Take 3 mLs (2.5 mg total) by nebulization every 6 (six) hours as needed for wheezing or shortness of breath. 150 mL 1  . budesonide-formoterol (SYMBICORT) 160-4.5 MCG/ACT inhaler Inhale 2 puffs into the lungs 2 (two) times daily. 1 Inhaler 3  . diphenhydrAMINE (BENADRYL) 50 MG capsule Take 50 mg by mouth every 4 (four) hours as needed.    . eletriptan (RELPAX) 20 MG tablet TAKE 1 TABLET BY MOUTH AS NEEDED FOR MIGRAINE OR HEADACHE. MAY REPEAT IN 2 HOURS IF HEADACHE PERSISTS OR RECURS 6 tablet 2  . fluticasone (FLONASE) 50 MCG/ACT nasal spray Place 2 sprays into both nostrils daily as needed.     Marland Kitchen glucose blood test strip Check blood sugar once a day DX:E11.65 LON:99 100 each 3  . Lancets (ONETOUCH ULTRASOFT) lancets Check blood sugar once a day DX:E11.65 LON:99 100 each 3  . levocetirizine (XYZAL) 5 MG tablet Take 5 mg by mouth every evening.    Marland Kitchen lisinopril-hydrochlorothiazide (PRINZIDE,ZESTORETIC) 10-12.5 MG tablet Take 1 tablet by mouth daily. If any plans for pregnancy, call doctor; do not take if you think you might be pregnant 90 tablet 3  . metFORMIN (GLUCOPHAGE-XR) 500 MG 24 hr tablet TAKE 1 TABLET BY MOUTH DAILY FOR 5  DAYS, 2 TABLETS DAILY FOR 5 DAYS, 3 TABLETS DAILY FOR 5 DAYS, THEN 4 TABLETS DAILY 315 tablet 0  . montelukast (SINGULAIR) 10 MG tablet Take 10 mg by mouth daily as needed.     . nystatin cream (MYCOSTATIN) APPLY EXTERNALLY TO THE AFFECTED AREA TWICE DAILY AS NEEDED 30 g 3  . ranitidine (ZANTAC) 150 MG tablet Take 2 tablets (300 mg total) by mouth at bedtime as needed.    Marland Kitchen. scopolamine (TRANSDERM-SCOP, 1.5 MG,) 1 MG/3DAYS Place 1 patch (1.5 mg total) onto the skin every 3 (three) days. (Patient not taking: Reported on 07/13/2018) 10 patch 12  . Vitamin D, Ergocalciferol, (DRISDOL) 1.25 MG (50000 UT) CAPS capsule Take 1 capsule (50,000  Units total) by mouth every 7 (seven) days. 4 capsule 1   No current facility-administered medications for this visit.     Allergies:   Cinnamon and Dm-guaifenesin er    Social History:  The patient  reports that she has never smoked. She has never used smokeless tobacco. She reports that she drinks alcohol. She reports that she does not use drugs.   Family History:  The patient's family history includes Aneurysm in her paternal grandfather; Asthma in her mother; Cancer in her mother; Diabetes in her father, maternal grandfather, maternal grandmother, and mother; Hypoparathyroidism in her mother; Juvenile idiopathic arthritis in her daughter; Leukemia in her paternal grandmother.    ROS:  Please see the history of present illness.   Otherwise, review of systems are positive for none.   All other systems are reviewed and negative.    PHYSICAL EXAM: VS:  BP 126/78 (BP Location: Right Arm, Patient Position: Sitting, Cuff Size: Large)   Ht 5\' 3"  (1.6 m)   Wt (!) 384 lb 8 oz (174.4 kg)   BMI 68.11 kg/m  , BMI Body mass index is 68.11 kg/m. GEN: Well nourished, well developed, in no acute distress  HEENT: normal  Neck: no JVD, carotid bruits, or masses Cardiac: RRR; no murmurs, rubs, or gallops,no edema  Respiratory:  clear to auscultation bilaterally, normal work of breathing GI: soft, nontender, nondistended, + BS MS: no deformity or atrophy  Skin: warm and dry, no rash Neuro:  Strength and sensation are intact Psych: euthymic mood, full affect   EKG:  EKG is ordered today. The ekg ordered today demonstrates normal sinus rhythm with borderline LVH.  No significant ST or T wave changes.  No evidence of prior infarct.   Recent Labs: 09/25/2018: ALT 63; BUN 10; Creat 0.46; Hemoglobin 12.2; Magnesium 1.7; Platelets 230; Potassium 3.9; Sodium 134; TSH 2.27    Lipid Panel    Component Value Date/Time   CHOL 189 09/25/2018 1136   CHOL 183 02/26/2016 1038   TRIG 161 (H) 09/25/2018  1136   HDL 39 (L) 09/25/2018 1136   HDL 44 02/26/2016 1038   CHOLHDL 4.8 09/25/2018 1136   VLDL 39 (H) 07/18/2017 1050   LDLCALC 122 (H) 09/25/2018 1136      Wt Readings from Last 3 Encounters:  10/22/18 (!) 384 lb 8 oz (174.4 kg)  08/06/18 (!) 388 lb 4.8 oz (176.1 kg)  07/13/18 (!) 393 lb (178.3 kg)       PAD Screen 10/22/2018  Previous PAD dx? No  Previous surgical procedure? No  Pain with walking? No  Feet/toe relief with dangling? No  Painful, non-healing ulcers? No  Extremities discolored? No      ASSESSMENT AND PLAN:  1.  Preop cardiovascular evaluation: The patient has  no prior cardiac history and had previous echocardiogram in 2015 that showed normal LV systolic function and no significant valvular abnormalities.  She currently has no symptoms suggestive of angina, heart failure or arrhythmia.  Obviously, her functional capacity is reduced related to her morbid obesity but she seems to be able to do activities of daily living without significant limitations. Her EKG is only slightly abnormal with borderline LVH which is expected given her obesity and known history of hypertension. Although she is diabetic, this was only diagnosed this year with hemoglobin A1c of 6.2 only. Due to all of that, I do not think she requires any further ischemic cardiac evaluation.  She can proceed with planned surgery at an overall low risk from a cardiac standpoint.  2.  Essential hypertension: Blood pressures controlled on current medications.    Disposition:   FU with me as needed  Signed,  Lorine Bears, MD  10/22/2018 9:22 AM    [facility]

## 2018-10-22 NOTE — Patient Instructions (Signed)
Medication Instructions:  No changes If you need a refill on your cardiac medications before your next appointment, please call your pharmacy.   Lab work: None ordered  Testing/Procedures: None ordered  Follow-Up: Your physician recommends that you follow up as needed.  Any Other Special Instructions Will Be Listed Below (If Applicable). You have been considered a low risk for surgery.

## 2018-10-26 ENCOUNTER — Telehealth: Payer: Self-pay | Admitting: Family Medicine

## 2018-10-26 MED ORDER — FAMOTIDINE 20 MG PO TABS: 20.0000 mg | ORAL_TABLET | Freq: Two times a day (BID) | ORAL | 0 refills | Status: DC | PRN

## 2018-10-26 NOTE — Telephone Encounter (Signed)
As far as the medicine she just wanted to have a care plan.  She is now on a liquid diet for 4 weeks and wants to know what she should look out for if her blood pressure drops.  Should she be checking bp and what is to low for her?

## 2018-10-26 NOTE — Telephone Encounter (Signed)
Copied from CRM [phone]. Topic: Quick Communication - See Telephone Encounter >> Oct 26, 2018 10:21 AM Angela NevinWilliams, Candice N wrote: CRM for notification. See Telephone encounter for: 10/26/18.  Patient requesting a call back from Dr. Sherie DonLada or CMA to discuss parameters to stopping lisinopril-hydrochlorothiazide (PRINZIDE,ZESTORETIC) 10-12.5 MG tablet prior to bariatric weight loss surgery on Jan 6. Please advise.

## 2018-10-26 NOTE — Telephone Encounter (Signed)
Mychart message

## 2018-10-26 NOTE — Telephone Encounter (Signed)
So we usually leave that up to the discretion of the bariatic surgeon I've never instructed a patient to stop medicine before surgery The surgeon usually instructs them to take their last dose the day before surgery and to NOT take any pills the morning of surgery Please ask her to contact her surgeon If there is any question on the part of the surgeon, he can call me

## 2018-10-26 NOTE — Telephone Encounter (Signed)
If she is on a liquid diet, just have her STOP the blood pressure pill then She is probably getting much less sodium than the typical American diet right now She can monitor her BP and contact us if running up over 140/90  Also, please let her know that there was a recall on the ranitidine; I sent a new Rx of famotidine to her local pharmacy; stop ranitidine, start famotidine  We wish her the best

## 2018-12-01 ENCOUNTER — Ambulatory Visit: Payer: 59 | Admitting: Family Medicine

## 2018-12-21 ENCOUNTER — Ambulatory Visit: Payer: Self-pay | Admitting: Family Medicine

## 2018-12-25 ENCOUNTER — Encounter: Payer: Self-pay | Admitting: Family Medicine

## 2018-12-25 ENCOUNTER — Ambulatory Visit (INDEPENDENT_AMBULATORY_CARE_PROVIDER_SITE_OTHER): Payer: No Typology Code available for payment source | Admitting: Family Medicine

## 2018-12-25 DIAGNOSIS — Z9884 Bariatric surgery status: Secondary | ICD-10-CM | POA: Diagnosis not present

## 2018-12-25 DIAGNOSIS — I10 Essential (primary) hypertension: Secondary | ICD-10-CM

## 2018-12-25 DIAGNOSIS — E1169 Type 2 diabetes mellitus with other specified complication: Secondary | ICD-10-CM

## 2018-12-25 DIAGNOSIS — E669 Obesity, unspecified: Secondary | ICD-10-CM

## 2018-12-25 NOTE — Progress Notes (Signed)
BP 136/72   Pulse 93   Temp 97.6 F (36.4 C) (Oral)   Resp 16   Ht 5\' 3"  (1.6 m)   Wt (!) 347 lb 1.6 oz (157.4 kg)   LMP 11/26/2018   SpO2 93%   BMI 61.49 kg/m    Subjective:    Patient ID: Tanya Reeves, female    DOB: 1986-06-08, 33 y.o.   MRN: [id number]  HPI: Tanya Reeves is a 33 y.o. female  Chief Complaint  Patient presents with  . 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, doing much better They are monitoring labs, and will do that at next appointment Wait until 3 months after until getting labs Starts bariatric vitamins on Monday  Depression screen [facility] 2/9 12/25/2018 06/30/2018 05/11/2018 04/09/2018 02/02/2018  Decreased Interest 0 0 0 0 0  Down, Depressed, Hopeless 0 0 0 0 0  PHQ - 2 Score 0 0 0 0 0  Altered sleeping 0 - - - -  Tired, decreased energy 0 - - - -  Change in appetite 0 - - - -  Feeling bad or failure about yourself  0 - - - -  Trouble concentrating 0 - - - -  Moving slowly or fidgety/restless 0 - - - -  Suicidal thoughts 0 - - - -  PHQ-9 Score 0 - - - -  Difficult doing work/chores Not difficult at all - - - -   Fall Risk  12/25/2018 08/06/2018 07/13/2018 07/02/2018 06/30/2018  Falls in the past year? 0 No No (No Data) No  Comment - - - no falls since previous visit -  Number falls in past yr: 0 - - - -  Injury with Fall? 0 - - - -    Relevant past medical, surgical, family  and social history reviewed Past Medical History:  Diagnosis Date  . Asthma   . Chronic hip pain   . Diabetes mellitus without complication (HCC)   . Hypertension   . Migraines   . Morbid obesity (HCC) 03/17/2016  . Sleep apnea    Past Surgical History:  Procedure Laterality Date  . CESAREAN SECTION     2  . CHOLECYSTECTOMY    . COLON SURGERY     part of bowel removed due to c-section  . sips     Family History  Problem Relation Age of Onset  . Diabetes Mother   . Cancer Mother        femal organs  . Hypoparathyroidism Mother   . Asthma Mother   . Diabetes Father   . Juvenile idiopathic arthritis Daughter   . Diabetes Maternal Grandmother   . Diabetes Maternal Grandfather   . Leukemia Paternal Grandmother   . Aneurysm Paternal Grandfather    Social History   Tobacco Use  . Smoking status: Never Smoker  . Smokeless tobacco: Never  Used  Substance Use Topics  . Alcohol use: Yes    Alcohol/week: 0.0 standard drinks    Comment: wine - 2 drinks per month  . Drug use: No     [facility]  AUDIT-C Score  0      Interim medical history since last visit reviewed. Allergies and medications reviewed  Review of Systems Per HPI unless specifically indicated above     Objective:    BP 136/72   Pulse 93   Temp 97.6 F (36.4 C) (Oral)   Resp 16   Ht 5\' 3"  (1.6 m)   Wt (!) 347 lb 1.6 oz (157.4 kg)   LMP 11/26/2018   SpO2 93%   BMI 61.49 kg/m   Wt Readings from Last 3 Encounters:  12/25/18 (!) 347 lb 1.6 oz (157.4 kg)  10/22/18 (!) 384 lb 8 oz (174.4 kg)  08/06/18 (!) 388 lb 4.8 oz (176.1 kg)    Physical Exam Constitutional:      General: She is not in acute distress.    Appearance: She is well-developed. She is not diaphoretic.  HENT:     Head: Normocephalic and atraumatic.  Eyes:     General: No scleral icterus. Neck:     Thyroid: No thyromegaly.  Cardiovascular:     Rate and Rhythm: Normal rate and  regular rhythm.     Heart sounds: Normal heart sounds. No murmur.  Pulmonary:     Effort: Pulmonary effort is normal. No respiratory distress.     Breath sounds: Normal breath sounds. No wheezing.  Abdominal:     General: Bowel sounds are normal. There is no distension.     Palpations: Abdomen is soft.     Comments: Trochar sites c/d/i, no drainage, appear to be healing well  Skin:    General: Skin is warm and dry.     Coloration: Skin is not pale.  Neurological:     Mental Status: She is alert.  Psychiatric:        Behavior: Behavior normal.        Thought Content: Thought content normal.        Judgment: Judgment normal.    Diabetic Foot Form - Detailed   Diabetic Foot Exam - detailed Diabetic Foot exam was performed with the following findings:  Yes 12/25/2018  6:19 PM  Visual Foot Exam completed.:  Yes  Pulse Foot Exam completed.:  Yes  Right Dorsalis Pedis:  Present Left Dorsalis Pedis:  Present  Sensory Foot Exam Completed.:  Yes Semmes-Weinstein Monofilament Test R Site 1-Great Toe:  Pos L Site 1-Great Toe:  Pos          Assessment & Plan:   Problem List Items Addressed This Visit      Cardiovascular and Mediastinum   Essential hypertension, benign (Chronic)    Fair control today off of medicines; she continues to lose weight s/p bariatric surgery which should help pressures        Endocrine   Diabetes mellitus type 2 in obese (HCC) (Chronic)    Patient is losing weight s/p bariatric surgery; she is watching her diet, will see bariatric surgeon in March; no labs here today; foot exam by MD today        Other   Status post biliopancreatic diversion with duodenal switch    Patient reports doing well after surgery; labs and f/u with bariatric surgeon; she is pleased with her weight loss thus far and  being off of medicines      Morbid obesity (HCC) (Chronic)    Patient reports doing well s/p duodenal switch; she'll see her bariatric surgeon in March who will be  checking her labs          Follow up plan: Return in about 3 months (around 03/25/2019) for follow-up visit with Dr. Sherie Don.  An after-visit summary was printed and given to the patient at check-out.  Please see the patient instructions which may contain other information and recommendations beyond what is mentioned above in the assessment and plan.  No orders of the defined types were placed in this encounter.   No orders of the defined types were placed in this encounter.

## 2018-12-25 NOTE — Patient Instructions (Addendum)
Check out quinoa and seitan for protein  I am so proud of you!

## 2018-12-27 DIAGNOSIS — Z9884 Bariatric surgery status: Secondary | ICD-10-CM | POA: Insufficient documentation

## 2018-12-27 NOTE — Assessment & Plan Note (Signed)
Fair control today off of medicines; she continues to lose weight s/p bariatric surgery which should help pressures

## 2018-12-27 NOTE — Assessment & Plan Note (Signed)
Patient is losing weight s/p bariatric surgery; she is watching her diet, will see bariatric surgeon in March; no labs here today; foot exam by MD today

## 2018-12-27 NOTE — Assessment & Plan Note (Signed)
Patient reports doing well after surgery; labs and f/u with bariatric surgeon; she is pleased with her weight loss thus far and being off of medicines

## 2018-12-27 NOTE — Assessment & Plan Note (Signed)
Patient reports doing well s/p duodenal switch; she'll see her bariatric surgeon in March who will be checking her labs

## 2019-02-19 ENCOUNTER — Ambulatory Visit (INDEPENDENT_AMBULATORY_CARE_PROVIDER_SITE_OTHER): Payer: No Typology Code available for payment source | Admitting: Nurse Practitioner

## 2019-02-19 ENCOUNTER — Encounter: Payer: Self-pay | Admitting: Nurse Practitioner

## 2019-02-19 ENCOUNTER — Encounter: Payer: Self-pay | Admitting: Family Medicine

## 2019-02-19 ENCOUNTER — Other Ambulatory Visit: Payer: Self-pay

## 2019-02-19 VITALS — Ht 63.0 in | Wt 302.0 lb

## 2019-02-19 DIAGNOSIS — J029 Acute pharyngitis, unspecified: Secondary | ICD-10-CM

## 2019-02-19 DIAGNOSIS — R590 Localized enlarged lymph nodes: Secondary | ICD-10-CM

## 2019-02-19 DIAGNOSIS — J302 Other seasonal allergic rhinitis: Secondary | ICD-10-CM

## 2019-02-19 DIAGNOSIS — L282 Other prurigo: Secondary | ICD-10-CM

## 2019-02-19 DIAGNOSIS — J4521 Mild intermittent asthma with (acute) exacerbation: Secondary | ICD-10-CM | POA: Diagnosis not present

## 2019-02-19 MED ORDER — ALBUTEROL SULFATE HFA 108 (90 BASE) MCG/ACT IN AERS: 2.0000 | INHALATION_SPRAY | RESPIRATORY_TRACT | 2 refills | Status: DC | PRN

## 2019-02-19 MED ORDER — AMOXICILLIN 500 MG PO CAPS: 500.0000 mg | ORAL_CAPSULE | Freq: Two times a day (BID) | ORAL | 0 refills | Status: DC

## 2019-02-19 NOTE — Progress Notes (Signed)
Virtual Visit via Video Note  I connected with Tanya Reeves on 02/19/19 at  3:00 PM EDT by a video enabled telemedicine application and verified that I am speaking with the correct person using two identifiers.   I discussed the limitations of evaluation and management by telemedicine and the availability of in person appointments. The patient expressed understanding and agreed to proceed.  History of Present Illness: Patient endorses sore throat for 11 days, has worsened the last 3 days. Niece was over prior to sore throat and was positive for strep. Woke up this morning with fever 101.2. Has had chills. Endorses PND and mild cough when she has a lot of drainage- states always get it around this time of year- takes antihistamine and flonase PRN.   States 3 days ago started to get rash under left breast, and area around belly button and area on the top of both feet, red, raised and itchy.    Observations/Objective: Alert and oriented,  Breathing unlabored Conjunctiva clear Throat moist, oropharynx erythematous without exudate uvula midline   Patient states she is unable to show rash at this time.    Assessment and Plan:  1. Sore throat Cepacol lozenge, declined magic mouthwash.  - amoxicillin (AMOXIL) 500 MG capsule; Take 1 capsule (500 mg total) by mouth 2 (two) times daily.  Dispense: 20 capsule; Refill: 0  2. Cervical lymphadenopathy - amoxicillin (AMOXIL) 500 MG capsule; Take 1 capsule (500 mg total) by mouth 2 (two) times daily.  Dispense: 20 capsule; Refill: 0  3. Mild intermittent asthma without exacerbation Stable  - albuterol (PROAIR HFA) 108 (90 Base) MCG/ACT inhaler; Inhale 2 puffs into the lungs every 4 (four) hours as needed.  Dispense: 1 Inhaler; Refill: 2  4. Seasonal allergies OTC antihistamines and flonase as needed   5. Pruritic rash Antihistamine and cortisone cream as needed; if unimproved or worsening please let us know. Patient states she is unable to  have further evaluation of this at this time due to work meeting- can try treatments and if not improved will let us know.   Follow Up Instructions: Follow-up if unimproved.     I discussed the assessment and treatment plan with the patient. The patient was provided an opportunity to ask questions and all were answered. The patient agreed with the plan and demonstrated an understanding of the instructions.   The patient was advised to call back or seek an in-person evaluation if the symptoms worsen or if the condition fails to improve as anticipated.  I provided 12 minutes of non-face-to-face time during this encounter.   Cheryle Horsfall, NP

## 2019-02-19 NOTE — Telephone Encounter (Signed)
Patient needs telehealth visit please, either with me or Sharyon Cable, DNP

## 2019-03-15 ENCOUNTER — Other Ambulatory Visit: Payer: Self-pay | Admitting: Family Medicine

## 2019-03-15 DIAGNOSIS — G43009 Migraine without aura, not intractable, without status migrainosus: Secondary | ICD-10-CM

## 2019-03-25 ENCOUNTER — Encounter: Payer: Self-pay | Admitting: Nurse Practitioner

## 2019-03-25 ENCOUNTER — Encounter: Payer: Self-pay | Admitting: Family Medicine

## 2019-03-25 ENCOUNTER — Other Ambulatory Visit: Payer: Self-pay

## 2019-03-25 ENCOUNTER — Ambulatory Visit (INDEPENDENT_AMBULATORY_CARE_PROVIDER_SITE_OTHER): Payer: No Typology Code available for payment source | Admitting: Nurse Practitioner

## 2019-03-25 VITALS — Temp 97.3°F | Ht 63.0 in | Wt 326.8 lb

## 2019-03-25 DIAGNOSIS — J4520 Mild intermittent asthma, uncomplicated: Secondary | ICD-10-CM

## 2019-03-25 DIAGNOSIS — G43009 Migraine without aura, not intractable, without status migrainosus: Secondary | ICD-10-CM

## 2019-03-25 DIAGNOSIS — I10 Essential (primary) hypertension: Secondary | ICD-10-CM

## 2019-03-25 DIAGNOSIS — G4733 Obstructive sleep apnea (adult) (pediatric): Secondary | ICD-10-CM | POA: Diagnosis not present

## 2019-03-25 DIAGNOSIS — R110 Nausea: Secondary | ICD-10-CM

## 2019-03-25 DIAGNOSIS — Z9989 Dependence on other enabling machines and devices: Secondary | ICD-10-CM

## 2019-03-25 DIAGNOSIS — K219 Gastro-esophageal reflux disease without esophagitis: Secondary | ICD-10-CM

## 2019-03-25 DIAGNOSIS — E669 Obesity, unspecified: Secondary | ICD-10-CM

## 2019-03-25 DIAGNOSIS — Z9884 Bariatric surgery status: Secondary | ICD-10-CM

## 2019-03-25 DIAGNOSIS — E1169 Type 2 diabetes mellitus with other specified complication: Secondary | ICD-10-CM

## 2019-03-25 MED ORDER — ONDANSETRON 4 MG PO TBDP: 4.0000 mg | ORAL_TABLET | Freq: Three times a day (TID) | ORAL | 0 refills | Status: DC | PRN

## 2019-03-25 NOTE — Progress Notes (Signed)
Virtual Visit via Video Note  I connected with Tanya Reeves on 03/25/19 at 10:40 AM EDT by a video enabled telemedicine application and verified that I am speaking with the correct person using two identifiers.   Staff discussed the limitations of evaluation and management by telemedicine and the availability of in person appointments. The patient expressed understanding and agreed to proceed.  Patient location: home  My location: home office Other people present: none HPI  Asthma States well controlled, only acts up with URI rx to albuterol PRN  OSA CPAP self reported compliance:100%  Migraines Takes relpax PRN states was having a lot of migraines initially but was well controlled, states in the last  Month has had increase stress and has some more migraines maybe 4-5 day a month.   Allergies Takes flonase, xyzal and Singulair PRN no longer needed, feeling much better.  Rash was working out side and started to get small red rash on hands and back of legs, area is itchy. Took OTC benadryl and cortisone with relief.    Status post bariatric stugery  Had weight loss surgery in January states sometimes has mild dizziness when she first stands up and notices some hair loss that she was told would be typical of weight loss surgery. She is due for lab work with her bariatric. Patient is currently taking bariatric supplements as prescribed. Endorses some nausea ever since, uses zofran ODT as needed. States scale she has at home is not accurate, feels she is continue to lose weight.  Wt Readings from Last 3 Encounters:  03/25/19 (!) 326 lb 12.8 oz (148.2 kg)  02/19/19 (!) 302 lb (137 kg)  12/25/18 (!) 347 lb 1.6 oz (157.4 kg)    Diabetes Was checking her blood sugars initially after surgery due to hypoglycemia. States is eating small frequent meals and no longer having hypoglycemia.  Lab Results  Component Value Date   HGBA1C 7.1 (H) 09/25/2018   Hyperlipidemia Diet- able to eat  protein, avoids very sugary foods- makes her sick. Eats 80-100 g of protein/daily; and eating green vegetables, very small amounts of carbs Lab Results  Component Value Date   CHOL 189 09/25/2018   HDL 39 (L) 09/25/2018   LDLCALC 122 (H) 09/25/2018   TRIG 161 (H) 09/25/2018   CHOLHDL 4.8 09/25/2018     PHQ2/9: Depression screen PHQ 2/9 03/25/2019 02/19/2019 12/25/2018 06/30/2018 05/11/2018  Decreased Interest 0 0 0 0 0  Down, Depressed, Hopeless 0 0 0 0 0  PHQ - 2 Score 0 0 0 0 0  Altered sleeping 0 0 0 - -  Tired, decreased energy 0 0 0 - -  Change in appetite 0 0 0 - -  Feeling bad or failure about yourself  0 0 0 - -  Trouble concentrating 0 0 0 - -  Moving slowly or fidgety/restless 0 0 0 - -  Suicidal thoughts 0 0 0 - -  PHQ-9 Score 0 0 0 - -  Difficult doing work/chores Not difficult at all Not difficult at all Not difficult at all - -    PHQ reviewed. Negative  Patient Active Problem List   Diagnosis Date Noted  . 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 Microalbuminuria due to type 2 diabetes mellitus (HCC) 07/01/2018  . Urine test positive for microalbuminuria 05/01/2018  . Asthma 07/18/2017  . Heavy periods 07/18/2017  . Hidradenitis suppurativa 03/24/2017  . Chronic sinusitis 10/02/2016  . High triglycerides 10/02/2016  . Shortness  of breath on exertion 08/26/2016  . Morbid obesity (HCC) 03/17/2016  . Essential hypertension, benign 02/26/2016  . Migraine without aura 02/26/2016  . OSA on CPAP 02/26/2016  . Medication monitoring encounter 02/26/2016  . Diabetes mellitus type 2 in obese (HCC) 02/27/2015  . Pre-existing hypertension during pregnancy in third trimester 09/23/2014  . Left ventricular hypertrophy 08/04/2014  . Previous cesarean delivery affecting pregnancy 07/07/2014    Past Medical History:  Diagnosis Date  . Asthma   . Chronic hip pain   . Diabetes mellitus without complication (HCC)   . Hypertension   .  Migraines   . Morbid obesity (HCC) 03/17/2016  . Sleep apnea     Past Surgical History:  Procedure Laterality Date  . CESAREAN SECTION     2  . CHOLECYSTECTOMY    . COLON SURGERY     part of bowel removed due to c-section  . sips      Social History   Tobacco Use  . Smoking status: Never Smoker  . Smokeless tobacco: Never Used  Substance Use Topics  . Alcohol use: Yes    Alcohol/week: 0.0 standard drinks    Comment: wine - 2 drinks per month     Current Outpatient Medications:  .  albuterol (PROAIR HFA) 108 (90 Base) MCG/ACT inhaler, Inhale 2 puffs into the lungs every 4 (four) hours as needed., Disp: 1 Inhaler, Rfl: 2 .  albuterol (PROVENTIL) (2.5 MG/3ML) 0.083% nebulizer solution, Take 3 mLs (2.5 mg total) by nebulization every 6 (six) hours as needed for wheezing or shortness of breath., Disp: 150 mL, Rfl: 1 .  amoxicillin (AMOXIL) 500 MG capsule, Take 1 capsule (500 mg total) by mouth 2 (two) times daily., Disp: 20 capsule, Rfl: 0 .  diphenhydrAMINE (BENADRYL) 50 MG capsule, Take 50 mg by mouth every 4 (four) hours as needed., Disp: , Rfl:  .  eletriptan (RELPAX) 20 MG tablet, TAKE 1 TABLET BY MOUTH AS NEEDED FOR MIGRAINE OR HEADACHE. MAY REPEAT IN 2 HOURS IF HEADACHE PERSISTS OR RECURS, Disp: 6 tablet, Rfl: 2 .  fluticasone (FLONASE) 50 MCG/ACT nasal spray, Place 2 sprays into both nostrils daily as needed. , Disp: , Rfl:  .  levocetirizine (XYZAL) 5 MG tablet, Take 5 mg by mouth every evening., Disp: , Rfl:  .  montelukast (SINGULAIR) 10 MG tablet, Take 10 mg by mouth daily as needed. , Disp: , Rfl:   Allergies  Allergen Reactions  . Cinnamon Other (See Comments)    Tongue swells up. With artificial (such as in gum/red hots)  . Dm-Guaifenesin Er     ROS   No other specific complaints in a complete review of systems (except as listed in HPI above).  Objective  Vitals:   03/25/19 1029  Temp: (!) 97.3 F (36.3 C)  TempSrc: Oral  Weight: (!) 326 lb 12.8 oz  (148.2 kg)  Height: 5\' 3"  (1.6 m)    Body mass index is 57.89 kg/m.  Nursing Note and Vital Signs reviewed.  Physical Exam  Constitutional: Patient appears well-developed and well-nourished. No distress.  HENT: Head: Normocephalic and atraumatic. Cardiovascular: Normal rate Pulmonary/Chest: Effort normal  Musculoskeletal: Normal range of motion,  Neurological: he is alert and oriented to person, place, and time. speech and gait are normal.  Skin: No rash noted. No erythema.  Psychiatric: Patient has a normal mood and affect. behavior is normal. Judgment and thought content normal.    Assessment & Plan 1. Essential hypertension, benign Asymptomatic, likely much  improved since weight loss, routinely mointor. Healthy lifestyle encouraged  2. Migraine without aura and without status migrainosus, not intractable Stress reduction, use meds PRN   3. Mild intermittent asthma, unspecified whether complicated Well controled   4. OSA on CPAP Compliant  5. Gastroesophageal reflux disease without esophagitis Eating small meals   6. Diabetes mellitus type 2 in obese (HCC) Likely improved  - Lipid Profile - HgB A1c  7. Nausea Discussed future refills from bariatric surgeon  - ondansetron (ZOFRAN-ODT) 4 MG disintegrating tablet; Take 1 tablet (4 mg total) by mouth every 8 (eight) hours as needed for nausea or vomiting.  Dispense: 20 tablet; Refill: 0  8. Status post bariatric surgery Continue supplementation and following up with meal plan, reach out to their office to order appropriate labs to [facility] due to her insurance coverage and will come in and get all labs checked      Follow Up Instructions:   4 month follow-up.  I discussed the assessment and treatment plan with the patient. The patient was provided an opportunity to ask questions and all were answered. The patient agreed with the plan and demonstrated an understanding of the instructions.   The patient was advised  to call back or seek an in-person evaluation if the symptoms worsen or if the condition fails to improve as anticipated.  I provided 22 minutes of non-face-to-face time during this encounter, 17 minutes on video and additional time in chart review and coordination of care.   Cheryle Horsfall, NP

## 2019-03-29 ENCOUNTER — Other Ambulatory Visit: Payer: Self-pay | Admitting: Nurse Practitioner

## 2019-03-29 DIAGNOSIS — Z9884 Bariatric surgery status: Secondary | ICD-10-CM

## 2019-03-31 ENCOUNTER — Encounter: Payer: Self-pay | Admitting: Family Medicine

## 2019-03-31 DIAGNOSIS — B370 Candidal stomatitis: Secondary | ICD-10-CM

## 2019-04-01 ENCOUNTER — Other Ambulatory Visit: Payer: Self-pay

## 2019-04-01 DIAGNOSIS — Z9884 Bariatric surgery status: Secondary | ICD-10-CM

## 2019-04-01 LAB — HEMOGLOBIN A1C
Hgb A1c MFr Bld: 5.8 % of total Hgb — ABNORMAL HIGH (ref <5.7)
Mean Plasma Glucose: 120 (calc)
eAG (mmol/L): 6.6 (calc)

## 2019-04-01 LAB — LIPID PANEL
Cholesterol: 142 mg/dL (ref <200)
HDL: 34 mg/dL — ABNORMAL LOW (ref >=50)
LDL Cholesterol (Calc): 83 mg/dL (calc)
Non-HDL Cholesterol (Calc): 108 mg/dL (calc) (ref <130)
Total CHOL/HDL Ratio: 4.2 (calc) (ref <5.0)
Triglycerides: 148 mg/dL (ref <150)

## 2019-04-02 ENCOUNTER — Telehealth: Payer: Self-pay | Admitting: Family Medicine

## 2019-04-02 ENCOUNTER — Other Ambulatory Visit: Payer: Self-pay | Admitting: Nurse Practitioner

## 2019-04-02 DIAGNOSIS — K1379 Other lesions of oral mucosa: Secondary | ICD-10-CM

## 2019-04-02 MED ORDER — MAGIC MOUTHWASH W/LIDOCAINE: 5.0000 mL | Freq: Three times a day (TID) | ORAL | 0 refills | Status: DC | PRN

## 2019-04-02 NOTE — Telephone Encounter (Signed)
Copied from CRM [id number]. Topic: General - Other >> Apr 02, 2019 12:21 PM Tamela Oddi wrote: Reason for CRM: Patient called to request that she get a script for the Magic Mouthwash that she and Dr. Olen Cordial spoke about at her visit on 03/25/19.  Patient stated that the doctor told her if it gets worse, she should call and let her know and she will send in a script for the mouthwash.  Patient would like the script now so she does not have to go through the weekend in pain.  Please advise and cal when it has been sent in.  CB# [phone]

## 2019-04-02 NOTE — Addendum Note (Signed)
Addended by: Doren Custard on: 04/02/2019 03:01 PM   Modules accepted: Orders

## 2019-04-02 NOTE — Telephone Encounter (Signed)
Rx is already sent in

## 2019-04-09 ENCOUNTER — Encounter: Payer: Self-pay | Admitting: Family Medicine

## 2019-04-13 LAB — COMPLETE METABOLIC PANEL WITH GFR
AG Ratio: 1.5 (calc) (ref 1.0–2.5)
ALT: 23 U/L (ref 6–29)
AST: 16 U/L (ref 10–30)
Albumin: 4.1 g/dL (ref 3.6–5.1)
Alkaline phosphatase (APISO): 58 U/L (ref 31–125)
BUN/Creatinine Ratio: 22 (calc) (ref 6–22)
BUN: 10 mg/dL (ref 7–25)
CO2: 26 mmol/L (ref 20–32)
Calcium: 9.4 mg/dL (ref 8.6–10.2)
Chloride: 105 mmol/L (ref 98–110)
Creat: 0.45 mg/dL — ABNORMAL LOW (ref 0.50–1.10)
GFR, Est African American: 152 mL/min/{1.73_m2} (ref >=60)
GFR, Est Non African American: 131 mL/min/{1.73_m2} (ref >=60)
Globulin: 2.8 g/dL (calc) (ref 1.9–3.7)
Glucose, Bld: 101 mg/dL — ABNORMAL HIGH (ref 65–99)
Potassium: 4.1 mmol/L (ref 3.5–5.3)
Sodium: 139 mmol/L (ref 135–146)
Total Bilirubin: 0.3 mg/dL (ref 0.2–1.2)
Total Protein: 6.9 g/dL (ref 6.1–8.1)

## 2019-04-13 LAB — VITAMIN A: Vitamin A (Retinoic Acid): 53 ug/dL (ref 38–98)

## 2019-04-13 LAB — CBC WITH DIFFERENTIAL/PLATELET
Absolute Monocytes: 381 cells/uL (ref 200–950)
Basophils Absolute: 48 cells/uL (ref 0–200)
Basophils Relative: 0.7 %
Eosinophils Absolute: 272 cells/uL (ref 15–500)
Eosinophils Relative: 4 %
HCT: 36.4 % (ref 35.0–45.0)
Hemoglobin: 12.1 g/dL (ref 11.7–15.5)
Lymphs Abs: 2802 cells/uL (ref 850–3900)
MCH: 26.7 pg — ABNORMAL LOW (ref 27.0–33.0)
MCHC: 33.2 g/dL (ref 32.0–36.0)
MCV: 80.2 fL (ref 80.0–100.0)
MPV: 10.8 fL (ref 7.5–12.5)
Monocytes Relative: 5.6 %
Neutro Abs: 3298 cells/uL (ref 1500–7800)
Neutrophils Relative %: 48.5 %
Platelets: 299 10*3/uL (ref 140–400)
RBC: 4.54 10*6/uL (ref 3.80–5.10)
RDW: 14.2 % (ref 11.0–15.0)
Total Lymphocyte: 41.2 %
WBC: 6.8 10*3/uL (ref 3.8–10.8)

## 2019-04-13 LAB — VITAMIN B1: Vitamin B1 (Thiamine): 13 nmol/L (ref 8–30)

## 2019-04-13 LAB — VITAMIN K1, SERUM: Vitamin K: 913 pg/mL (ref 130–1500)

## 2019-04-13 LAB — MAGNESIUM: Magnesium: 1.9 mg/dL (ref 1.5–2.5)

## 2019-04-13 LAB — VITAMIN D 25 HYDROXY (VIT D DEFICIENCY, FRACTURES): Vit D, 25-Hydroxy: 23 ng/mL — ABNORMAL LOW (ref 30–100)

## 2019-04-13 LAB — TSH: TSH: 1.46 mIU/L

## 2019-04-13 LAB — VITAMIN E
Gamma-Tocopherol (Vit E): 1.3 mg/L (ref <=4.3)
Vitamin E (Alpha Tocopherol): 11.5 mg/L (ref 5.7–19.9)

## 2019-04-13 LAB — COPPER, FREE: Copper - Free, Serum/Plasma: 640 ug/L

## 2019-04-13 LAB — PARATHYROID HORMONE, INTACT (NO CA): PTH: 45 pg/mL (ref 14–64)

## 2019-04-13 LAB — FERRITIN: Ferritin: 31 ng/mL (ref 16–154)

## 2019-04-13 LAB — VITAMIN B12: Vitamin B-12: 1895 pg/mL — ABNORMAL HIGH (ref 200–1100)

## 2019-04-13 LAB — FOLATE: Folate: 15 ng/mL

## 2019-04-13 LAB — ZINC: Zinc: 82 ug/dL (ref 60–130)

## 2019-11-23 DIAGNOSIS — Z6841 Body Mass Index (BMI) 40.0 and over, adult: Secondary | ICD-10-CM | POA: Insufficient documentation

## 2020-01-17 ENCOUNTER — Encounter: Payer: Self-pay | Admitting: Family Medicine

## 2020-02-15 NOTE — Progress Notes (Signed)
Patient: Tanya Reeves, Female    DOB: 1986/02/28, 34 y.o.   MRN: [id number] Delsa Grana, PA-C Visit Date: 02/16/2020  Today's Provider: Delsa Grana, PA-C   Chief Complaint  Patient presents with  . Annual Exam  . Labs Only    post op for gastric surgery   Subjective:   Annual physical exam:  Tanya Reeves is a 34 y.o. female who presents today for complete physical exam: Exercise/Activity:  Walking several times a week  Diet/nutrition:  Seeing specialist - dietician with bariatric surgery  Sleep: sleeps well OSA on CPAP  Pt wished to routine f/up on chronic conditions today in addition to CPE.  Her past PCP has left the clinic, she has been doing physicals and routine f/up with post op bariatric care here, previously with Dr. Sanda Klein.   Advised pt of separate visit billing/coding  Duodenal SIPS procedure one year ago, she sees Surgeon with [facility], has done f/up with surgeon Kreg Shropshire and PA since procedure, he will follow her for 5 years, she still works with nutritionist. She does post op nutritional labs here because they will be covered by insurance.  There is a long list of labs and dx ordered, visible in care everywhere, and pt request we order here and she will get results to her MD.  Entered as visible and once reviewed with pt, she states she will do these labs for 5 years after surgery.  Wt Readings from Last 5 Encounters:  02/16/20 (!) 306 lb 12.8 oz (139.2 kg)  03/25/19 (!) 326 lb 12.8 oz (148.2 kg)  02/19/19 (!) 302 lb (137 kg)  12/25/18 (!) 347 lb 1.6 oz (157.4 kg)  10/22/18 (!) 384 lb 8 oz (174.4 kg)   BMI Readings from Last 5 Encounters:  02/16/20 54.35 kg/m  03/25/19 57.89 kg/m  02/19/19 53.50 kg/m  12/25/18 61.49 kg/m  10/22/18 68.11 kg/m     Mild intermittent asthma- pt states it is well controlled with only SABA use, previously was on maintenance inhaler.  She states no current sx right now.   usually with changing seasons and  allergies - she usually has 2 exacerbations a year requiring steroids.  Migraines welll controlled usually only has migraines one every 2-3 months, relpax is efficient and effective, requests refills today   zofran PRN for N associated with postop changes/dumping syndrome   She also notes hx of HS.  Diabetes Mellitus Type II: Hx of being well controlledl Currently managing with lifestyle (surgery, weight loss, nutritionist)  CBG pt not monitoring    No hypoglycemic episodes Denies: Polyuria, polydipsia, polyphagia, vision changes, or neuropathy Recent pertinent labs: Lab Results  Component Value Date   HGBA1C 5.8 (H) 03/31/2019   HGBA1C 7.1 (H) 09/25/2018   HGBA1C 7.1 (H) 07/13/2018  Pt is DUE for DM foot exam and eye exam ACEI/ARB: No Statin: No   USPSTF grade A and B recommendations - reviewed and addressed today  Depression:  Phq 9 completed today by patient, was reviewed by me with patient in the room PHQ score is neg, reviewed  PHQ 2/9 Scores 02/16/2020 03/25/2019 02/19/2019 12/25/2018  PHQ - 2 Score 0 0 0 0  PHQ- 9 Score 0 0 0 0   Depression screen [facility] 2/9 02/16/2020 03/25/2019 02/19/2019 12/25/2018 06/30/2018  Decreased Interest 0 0 0 0 0  Down, Depressed, Hopeless 0 0 0 0 0  PHQ - 2 Score 0 0 0 0 0  Altered sleeping 0 0  0 0 -  Tired, decreased energy 0 0 0 0 -  Change in appetite 0 0 0 0 -  Feeling bad or failure about yourself  0 0 0 0 -  Trouble concentrating 0 0 0 0 -  Moving slowly or fidgety/restless 0 0 0 0 -  Suicidal thoughts 0 0 0 0 -  PHQ-9 Score 0 0 0 0 -  Difficult doing work/chores Not difficult at all Not difficult at all Not difficult at all Not difficult at all -    Alcohol screening:   [facility] Visit from 02/16/2020 in [facility]  AUDIT-C Score  0      Immunizations and Health Maintenance: Health Maintenance  Topic Date Due  . OPHTHALMOLOGY EXAM  Never done  . URINE MICROALBUMIN  05/01/2019  . HEMOGLOBIN A1C  10/01/2019  . FOOT  EXAM  12/26/2019  . PAP SMEAR-Modifier  07/13/2021  . TETANUS/TDAP  01/16/2025  . PNEUMOCOCCAL POLYSACCHARIDE VACCINE AGE 64-64 HIGH RISK  Completed  . HIV Screening  Completed  . INFLUENZA VACCINE  Discontinued     Hep C Screening: not indicated  STD testing and prevention (HIV/chl/gon/syphilis):  see above, no additional testing desired by pt today - married   Intimate partner violence:  None denied  Sexual History/Pain during Intercourse: Married  Menstrual History/LMP/Abnormal Bleeding: no concerns, menses reg, heavy Patient's last menstrual period was 02/08/2020.  Incontinence Symptoms:  Some minor urge, but nothing bothersome or affecting her life   Breast cancer:  Last Mammogram: see HM list above BRCA gene screening:   2 biopsies previously  Maternal aunt and grandmother, no genetic testing  Cervical cancer screening:  Pt denies any other family hx of cancers other than noted above and below- breast, ovarian, uterine, colon:    Mother with cervical CA  Osteoporosis:   Discussion on osteoporosis per age, including high calcium and vitamin D supplementation, weight bearing exercises Pt is supplementing with daily calcium/Vit D - supplementing after surery (bariatric advantage - specific to type of surgeon)  Bariatric multivitamin  Skin cancer:  Hx of skin CA -  NO Discussed atypical lesions   Colorectal cancer:   Colonoscopy is not due for age Discussed concerning signs and sx of CRC, pt denies change   Lung cancer:   Low Dose CT Chest recommended if Age 71-80 years, 30 pack-year currently smoking OR have quit w/in 15years. Patient does not qualify.    Social History   Tobacco Use  . Smoking status: Never Smoker  . Smokeless tobacco: Never Used  Substance Use Topics  . Alcohol use: Yes    Alcohol/week: 0.0 standard drinks    Comment: wine - 2 drinks per month  . Drug use: No       [facility] from [facility]/16/2020 in [facility]  AUDIT-C  Score  0      Family History  Problem Relation Age of Onset  . Diabetes Mother   . Cancer Mother        femal organs  . Hypoparathyroidism Mother   . Asthma Mother   . Diabetes Father   . Juvenile idiopathic arthritis Daughter   . Diabetes Maternal Grandmother   . Diabetes Maternal Grandfather   . Leukemia Paternal Grandmother   . Aneurysm Paternal Grandfather      Blood pressure/Hypertension: BP Readings from Last 3 Encounters:  02/16/20 102/64  12/25/18 136/72  10/22/18 126/78    Weight/Obesity: Wt Readings from Last 3 Encounters:  02/16/20 Marland Kitchen)  306 lb 12.8 oz (139.2 kg)  03/25/19 (!) 326 lb 12.8 oz (148.2 kg)  02/19/19 (!) 302 lb (137 kg)   BMI Readings from Last 3 Encounters:  02/16/20 54.35 kg/m  03/25/19 57.89 kg/m  02/19/19 53.50 kg/m     Lipids:  Lab Results  Component Value Date   CHOL 142 03/31/2019   CHOL 189 09/25/2018   CHOL 196 02/02/2018   Lab Results  Component Value Date   HDL 34 (L) 03/31/2019   HDL 39 (L) 09/25/2018   HDL 39 (L) 02/02/2018   Lab Results  Component Value Date   LDLCALC 83 03/31/2019   LDLCALC 122 (H) 09/25/2018   LDLCALC 123 (H) 02/02/2018   Lab Results  Component Value Date   TRIG 148 03/31/2019   TRIG 161 (H) 09/25/2018   TRIG 224 (H) 02/02/2018   Lab Results  Component Value Date   CHOLHDL 4.2 03/31/2019   CHOLHDL 4.8 09/25/2018   CHOLHDL 5.0 (H) 02/02/2018   No results found for: LDLDIRECT Based on the results of lipid panel his/her cardiovascular risk factor ( using Bradley )  in the next 10 years is: The ASCVD Risk score Mikey Bussing DC Jr., et al., 2013) failed to calculate for the following reasons:   The 2013 ASCVD risk score is only valid for ages 37 to 9 Glucose:  Glucose  Date Value Ref Range Status  12/10/2014 83 65 - 99 mg/dL Final  04/10/2013 123 (H) 65 - 99 mg/dL Final   Glucose, Bld  Date Value Ref Range Status  04/02/2019 101 (H) 65 - 99 mg/dL Final    Comment:    .             Fasting reference interval . For someone without known diabetes, a glucose value between 100 and 125 mg/dL is consistent with prediabetes and should be confirmed with a follow-up test. .   09/25/2018 111 (H) 65 - 99 mg/dL Final    Comment:    .            Fasting reference interval . For someone without known diabetes, a glucose value between 100 and 125 mg/dL is consistent with prediabetes and should be confirmed with a follow-up test. .   07/13/2018 109 65 - 139 mg/dL Final    Comment:    .        Non-fasting reference interval .    Hypertension: BP Readings from Last 3 Encounters:  02/16/20 102/64  12/25/18 136/72  10/22/18 126/78   Obesity: Wt Readings from Last 3 Encounters:  02/16/20 (!) 306 lb 12.8 oz (139.2 kg)  03/25/19 (!) 326 lb 12.8 oz (148.2 kg)  02/19/19 (!) 302 lb (137 kg)   BMI Readings from Last 3 Encounters:  02/16/20 54.35 kg/m  03/25/19 57.89 kg/m  02/19/19 53.50 kg/m      Advanced Care Planning:  A voluntary discussion about advance care planning including the explanation and discussion of advance directives.   Discussed health care proxy and Living will, and the patient was able to identify a health care proxy as husband. Patient does not have a living will at present time.   Social History      She        Social History   Socioeconomic History  . Marital status: Married    Spouse name: Christia Reading   . Number of children: 2  . Years of education: Not on file  . Highest education level: Associate degree: academic program  Occupational History  . Not on file  Tobacco Use  . Smoking status: Never Smoker  . Smokeless tobacco: Never Used  Substance and Sexual Activity  . Alcohol use: Yes    Alcohol/week: 0.0 standard drinks    Comment: wine - 2 drinks per month  . Drug use: No  . Sexual activity: Yes    Partners: Male    Birth control/protection: Surgical    Comment: Tubal Ligation   Other Topics Concern  . Not on file    Social History Narrative  . Not on file   Social Determinants of Health   Financial Resource Strain:   . Difficulty of Paying Living Expenses:   Food Insecurity:   . Worried About Charity fundraiser in the Last Year:   . Arboriculturist in the Last Year:   Transportation Needs:   . Film/video editor (Medical):   Marland Kitchen Lack of Transportation (Non-Medical):   Physical Activity: Sufficiently Active  . Days of Exercise per Week: 5 days  . Minutes of Exercise per Session: 30 min  Stress:   . Feeling of Stress :   Social Connections:   . Frequency of Communication with Friends and Family:   . Frequency of Social Gatherings with Friends and Family:   . Attends Religious Services:   . Active Member of Clubs or Organizations:   . Attends Archivist Meetings:   Marland Kitchen Marital Status:     Family History        Family History  Problem Relation Age of Onset  . Diabetes Mother   . Cancer Mother        femal organs  . Hypoparathyroidism Mother   . Asthma Mother   . Diabetes Father   . Juvenile idiopathic arthritis Daughter   . Diabetes Maternal Grandmother   . Diabetes Maternal Grandfather   . Leukemia Paternal Grandmother   . Aneurysm Paternal Grandfather     Patient Active Problem List   Diagnosis Date Noted  . 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 Gastroesophageal reflux disease without esophagitis 08/26/2018  . Microalbuminuria due to type 2 diabetes mellitus ([place]) 07/01/2018  . Urine test positive for microalbuminuria 05/01/2018  . Asthma 07/18/2017  . Heavy periods 07/18/2017  . Hidradenitis suppurativa 03/24/2017  . Chronic sinusitis 10/02/2016  . High triglycerides 10/02/2016  . Shortness of breath on exertion 08/26/2016  . Morbid obesity ([place]) 03/17/2016  . Essential hypertension, benign 02/26/2016  . Migraine without aura 02/26/2016  . OSA on CPAP 02/26/2016  . Medication monitoring encounter 02/26/2016  . Diabetes  mellitus type 2 in obese ([place]) 02/27/2015  . Pre-existing hypertension during pregnancy in third trimester 09/23/2014  . Left ventricular hypertrophy 08/04/2014  . Previous cesarean delivery affecting pregnancy 07/07/2014    Past Surgical History:  Procedure Laterality Date  . CESAREAN SECTION     2  . CHOLECYSTECTOMY    . COLON SURGERY     part of bowel removed due to c-section  . sips    . STOMACH SURGERY       Current Outpatient Medications:  .  albuterol (PROAIR HFA) 108 (90 Base) MCG/ACT inhaler, Inhale 2 puffs into the lungs every 4 (four) hours as needed., Disp: 1 Inhaler, Rfl: 2 .  eletriptan (RELPAX) 20 MG tablet, TAKE 1 TABLET BY MOUTH AS NEEDED FOR MIGRAINE OR HEADACHE. MAY REPEAT IN 2 HOURS IF HEADACHE PERSISTS OR RECURS, Disp: 6 tablet, Rfl: 2 .  ondansetron (  ZOFRAN-ODT) 4 MG disintegrating tablet, Take 1 tablet (4 mg total) by mouth every 8 (eight) hours as needed for nausea or vomiting., Disp: 20 tablet, Rfl: 0  Allergies  Allergen Reactions  . Cinnamon Other (See Comments)    Tongue swells up. With artificial (such as in gum/red hots)  . Dm-Guaifenesin Er     Patient Care Team: Delsa Grana, PA-C as PCP - General (Family Medicine)  Review of Systems  Constitutional: Negative.  Negative for activity change, appetite change, fatigue and unexpected weight change.  HENT: Negative.   Eyes: Negative.   Respiratory: Negative.  Negative for shortness of breath.   Cardiovascular: Negative.  Negative for chest pain, palpitations and leg swelling.  Gastrointestinal: Negative.  Negative for abdominal pain and blood in stool.  Endocrine: Negative.   Genitourinary: Negative.   Musculoskeletal: Negative.  Negative for arthralgias, gait problem, joint swelling and myalgias.  Skin: Negative.  Negative for color change, pallor and rash.  Allergic/Immunologic: Negative.   Neurological: Negative.  Negative for syncope and weakness.  Hematological: Negative.     Psychiatric/Behavioral: Negative.  Negative for confusion, dysphoric mood, self-injury and suicidal ideas. The patient is not nervous/anxious.       I personally reviewed active problem list, medication list, allergies, family history, social history, health maintenance, notes from last encounter, lab results, imaging with the patient/caregiver today.        Objective:   Vitals:  Vitals:   02/16/20 0920  BP: 102/64  Pulse: 80  Resp: 14  Temp: 98.1 F (36.7 C)  SpO2: 98%  Weight: (!) 306 lb 12.8 oz (139.2 kg)  Height: 5' 3"  (1.6 m)    Body mass index is 54.35 kg/m.  Physical Exam Vitals and nursing note reviewed.  Constitutional:      General: She is not in acute distress.    Appearance: Normal appearance. She is well-developed. She is morbidly obese. She is not ill-appearing, toxic-appearing or diaphoretic.     Interventions: Face mask in place.  HENT:     Head: Normocephalic and atraumatic.     Right Ear: External ear normal.     Left Ear: External ear normal.  Eyes:     General: Lids are normal. No scleral icterus.       Right eye: No discharge.        Left eye: No discharge.     Conjunctiva/sclera: Conjunctivae normal.  Neck:     Thyroid: No thyroid mass, thyromegaly or thyroid tenderness.     Trachea: Phonation normal. No tracheal deviation.  Cardiovascular:     Rate and Rhythm: Normal rate and regular rhythm.     Pulses: Normal pulses.          Radial pulses are 2+ on the right side and 2+ on the left side.       Posterior tibial pulses are 2+ on the right side and 2+ on the left side.     Heart sounds: Normal heart sounds. No murmur. No friction rub. No gallop.   Pulmonary:     Effort: Pulmonary effort is normal. No respiratory distress.     Breath sounds: Normal breath sounds. No stridor. No wheezing, rhonchi or rales.  Chest:     Chest wall: No tenderness.  Abdominal:     General: Bowel sounds are normal. There is no distension.     Palpations:  Abdomen is soft.     Tenderness: There is no abdominal tenderness. There is no guarding or rebound.  Comments: soft obese abd with large panus  Musculoskeletal:        General: No deformity. Normal range of motion.     Cervical back: Normal range of motion and neck supple.     Right lower leg: No edema.     Left lower leg: No edema.  Lymphadenopathy:     Cervical: No cervical adenopathy.  Skin:    General: Skin is warm and dry.     Capillary Refill: Capillary refill takes less than 2 seconds.     Coloration: Skin is not jaundiced or pale.     Findings: No rash.  Neurological:     Mental Status: She is alert and oriented to person, place, and time.     Motor: No abnormal muscle tone.     Gait: Gait normal.  Psychiatric:        Speech: Speech normal.        Behavior: Behavior normal.       Fall Risk: Fall Risk  02/16/2020 03/25/2019 02/19/2019 12/25/2018 08/06/2018  Falls in the past year? 0 0 0 0 No  Comment - - - - -  Number falls in past yr: 0 0 0 0 -  Injury with Fall? 0 0 0 0 -    Functional Status Survey: Is the patient deaf or have difficulty hearing?: No Does the patient have difficulty seeing, even when wearing glasses/contacts?: No Does the patient have difficulty concentrating, remembering, or making decisions?: No Does the patient have difficulty walking or climbing stairs?: No Does the patient have difficulty dressing or bathing?: No Does the patient have difficulty doing errands alone such as visiting a doctor's office or shopping?: No   Assessment & Plan:    CPE completed today  . USPSTF grade A and B recommendations reviewed with patient; age-appropriate recommendations, preventive care, screening tests, etc discussed and encouraged; healthy living encouraged; see AVS for patient education given to patient  . Discussed importance of 150 minutes of physical activity weekly, AHA exercise recommendations given to pt in AVS/handout  . Discussed importance of  healthy diet:  eating lean meats and proteins, avoiding trans fats and saturated fats, avoid simple sugars and excessive carbs in diet, eat 6 servings of fruit/vegetables daily and drink plenty of water and avoid sweet beverages.    . Recommended pt to do annual eye exam and routine dental exams/cleanings  . Depression, alcohol, fall screening completed as documented above and per flowsheets  . Reviewed Health Maintenance: Health Maintenance  Topic Date Due  . OPHTHALMOLOGY EXAM  Never done  . URINE MICROALBUMIN  05/01/2019  . HEMOGLOBIN A1C  10/01/2019  . FOOT EXAM  12/26/2019  . PAP SMEAR-Modifier  07/13/2021  . TETANUS/TDAP  01/16/2025  . PNEUMOCOCCAL POLYSACCHARIDE VACCINE AGE 40-64 HIGH RISK  Completed  . HIV Screening  Completed  . INFLUENZA VACCINE  Discontinued    . Immunizations: Immunization History  Administered Date(s) Administered  . Influenza,inj,Quad PF,6+ Mos 08/18/2014, 07/18/2017  . Pneumococcal Polysaccharide-23 07/18/2017  . Tdap 10/24/2014      ICD-10-CM   1. Adult general medical exam  Z00.00 CBC with Differential/Platelet    COMPLETE METABOLIC PANEL WITH GFR    Lipid panel    Hemoglobin A1c  2. H/O bariatric surgery  Z98.84 Magnesium    Vitamin D (25 hydroxy)    Vitamin B1    Folate    B12    Ferritin    TSH    Vitamin A    Vitamin  E    Vitamin K1, Serum    Zinc    Copper, serum    PTH, Intact and Calcium    Iron  3. Diabetes mellitus type 2 in obese (HCC)  Z61.09 COMPLETE METABOLIC PANEL WITH GFR   E66.9 Lipid panel    Hemoglobin A1c   not currently on DM meds, has lost nearly 100 lbs in the past year, recheck labs urine micro  4. Essential hypertension, benign  U04 COMPLETE METABOLIC PANEL WITH GFR   BP well controlled, manged with lifestyle/no current med management  5. Mild intermittent asthma, unspecified whether complicated  V40.98    mild intermittent, usually two exacerbations in a year, very reactive, she has not been able to  prevent with allergy meds, maintence inhaler, or singular.  6. Migraine without aura and without status migrainosus, not intractable  G43.009 eletriptan (RELPAX) 20 MG tablet   usually she has migraines once every 2-3 months, slightly more frequent due to increased stress, requests refill on maxalt  7. Morbid obesity ([place])  E66.01   8. Screening for lipoid disorders  Z13.220 CBC with Differential/Platelet    COMPLETE METABOLIC PANEL WITH GFR    Lipid panel  9. Screening for endocrine, metabolic and immunity disorder  Z13.29 CBC with Differential/Platelet   J19.147 COMPLETE METABOLIC PANEL WITH GFR   Z13.0 Lipid panel  10. Screening for deficiency anemia  Z13.0 CBC with Differential/Platelet  11. Class 3 severe obesity with body mass index (BMI) of 50.0 to 59.9 in adult, unspecified obesity type, unspecified whether serious comorbidity present (HCC)  E66.01 CBC with Differential/Platelet   W29.56 COMPLETE METABOLIC PANEL WITH GFR    Lipid panel    Hemoglobin A1c    Magnesium    Vitamin D (25 hydroxy)    Vitamin B1    Folate    B12    Ferritin    TSH    Vitamin A    Vitamin E    Vitamin K1, Serum    Zinc    Copper, serum    PTH, Intact and Calcium    Iron  12. Vitamin D deficiency  O13.0 COMPLETE METABOLIC PANEL WITH GFR    Vitamin D (25 hydroxy)  13. OSA on CPAP  G47.33 CBC with Differential/Platelet   Z99.89    compliant with sleep apnea, she sleeps well  14. Nausea  R11.0 ondansetron (ZOFRAN-ODT) 4 MG disintegrating tablet   as expected SE from bariatric surgery.  she uses zofran prn, it is effective.  She requests refills  15. Microalbuminuria due to type 2 diabetes mellitus (HCC)  Q65.78 COMPLETE METABOLIC PANEL WITH GFR   R80.9 POCT UA - Microalbumin  16. Hidradenitis suppurativa  L73.2      Pt has Hx of H.S. sees Dr. Phillip Heal -  [place] [facility] sometimes need yearly referral [facility] referred by Dr. Sanda Klein in the past  Pt doesn't want to have to  come in for another office visit in the future for referrals.  Delsa Grana, PA-C 02/16/20 9:32 AM  [facility]

## 2020-02-16 ENCOUNTER — Other Ambulatory Visit: Payer: Self-pay

## 2020-02-16 ENCOUNTER — Ambulatory Visit (INDEPENDENT_AMBULATORY_CARE_PROVIDER_SITE_OTHER): Payer: No Typology Code available for payment source | Admitting: Family Medicine

## 2020-02-16 ENCOUNTER — Encounter: Payer: Self-pay | Admitting: Family Medicine

## 2020-02-16 VITALS — BP 102/64 | HR 80 | Temp 98.1°F | Resp 14 | Ht 63.0 in | Wt 306.8 lb

## 2020-02-16 DIAGNOSIS — Z6841 Body Mass Index (BMI) 40.0 and over, adult: Secondary | ICD-10-CM

## 2020-02-16 DIAGNOSIS — R809 Proteinuria, unspecified: Secondary | ICD-10-CM

## 2020-02-16 DIAGNOSIS — E559 Vitamin D deficiency, unspecified: Secondary | ICD-10-CM

## 2020-02-16 DIAGNOSIS — Z13228 Encounter for screening for other metabolic disorders: Secondary | ICD-10-CM

## 2020-02-16 DIAGNOSIS — J4520 Mild intermittent asthma, uncomplicated: Secondary | ICD-10-CM

## 2020-02-16 DIAGNOSIS — Z9884 Bariatric surgery status: Secondary | ICD-10-CM | POA: Diagnosis not present

## 2020-02-16 DIAGNOSIS — E1169 Type 2 diabetes mellitus with other specified complication: Secondary | ICD-10-CM

## 2020-02-16 DIAGNOSIS — G43009 Migraine without aura, not intractable, without status migrainosus: Secondary | ICD-10-CM

## 2020-02-16 DIAGNOSIS — E1129 Type 2 diabetes mellitus with other diabetic kidney complication: Secondary | ICD-10-CM

## 2020-02-16 DIAGNOSIS — I10 Essential (primary) hypertension: Secondary | ICD-10-CM | POA: Diagnosis not present

## 2020-02-16 DIAGNOSIS — L732 Hidradenitis suppurativa: Secondary | ICD-10-CM

## 2020-02-16 DIAGNOSIS — Z13220 Encounter for screening for lipoid disorders: Secondary | ICD-10-CM

## 2020-02-16 DIAGNOSIS — Z9989 Dependence on other enabling machines and devices: Secondary | ICD-10-CM

## 2020-02-16 DIAGNOSIS — E669 Obesity, unspecified: Secondary | ICD-10-CM

## 2020-02-16 DIAGNOSIS — Z0000 Encounter for general adult medical examination without abnormal findings: Secondary | ICD-10-CM

## 2020-02-16 DIAGNOSIS — R110 Nausea: Secondary | ICD-10-CM

## 2020-02-16 DIAGNOSIS — Z1329 Encounter for screening for other suspected endocrine disorder: Secondary | ICD-10-CM

## 2020-02-16 DIAGNOSIS — G4733 Obstructive sleep apnea (adult) (pediatric): Secondary | ICD-10-CM

## 2020-02-16 DIAGNOSIS — Z130 Encounter for screening for diseases of the blood and blood-forming organs and certain disorders involving the immune mechanism: Secondary | ICD-10-CM

## 2020-02-16 LAB — POCT UA - MICROALBUMIN: Microalbumin Ur, POC: 100 mg/L

## 2020-02-16 MED ORDER — ELETRIPTAN HYDROBROMIDE 20 MG PO TABS: ORAL_TABLET | ORAL | 5 refills | Status: DC

## 2020-02-16 MED ORDER — ONDANSETRON 4 MG PO TBDP: 4.0000 mg | ORAL_TABLET | Freq: Three times a day (TID) | ORAL | 0 refills | Status: DC | PRN

## 2020-02-16 NOTE — Patient Instructions (Signed)
Preventive Care 21-36 Years Old, Female Preventive care refers to visits with your health care provider and lifestyle choices that can promote health and wellness. This includes:  A yearly physical exam. This may also be called an annual well check.  Regular dental visits and eye exams.  Immunizations.  Screening for certain conditions.  Healthy lifestyle choices, such as eating a healthy diet, getting regular exercise, not using drugs or products that contain nicotine and tobacco, and limiting alcohol use. What can I expect for my preventive care visit? Physical exam Your health care provider will check your:  Height and weight. This may be used to calculate body mass index (BMI), which tells if you are at a healthy weight.  Heart rate and blood pressure.  Skin for abnormal spots. Counseling Your health care provider may ask you questions about your:  Alcohol, tobacco, and drug use.  Emotional well-being.  Home and relationship well-being.  Sexual activity.  Eating habits.  Work and work environment.  Method of birth control.  Menstrual cycle.  Pregnancy history. What immunizations do I need?  Influenza (flu) vaccine  This is recommended every year. Tetanus, diphtheria, and pertussis (Tdap) vaccine  You may need a Td booster every 10 years. Varicella (chickenpox) vaccine  You may need this if you have not been vaccinated. Human papillomavirus (HPV) vaccine  If recommended by your health care provider, you may need three doses over 6 months. Measles, mumps, and rubella (MMR) vaccine  You may need at least one dose of MMR. You may also need a second dose. Meningococcal conjugate (MenACWY) vaccine  One dose is recommended if you are age 19-21 years and a first-year college student living in a residence hall, or if you have one of several medical conditions. You may also need additional booster doses. Pneumococcal conjugate (PCV13) vaccine  You may need  this if you have certain conditions and were not previously vaccinated. Pneumococcal polysaccharide (PPSV23) vaccine  You may need one or two doses if you smoke cigarettes or if you have certain conditions. Hepatitis A vaccine  You may need this if you have certain conditions or if you travel or work in places where you may be exposed to hepatitis A. Hepatitis B vaccine  You may need this if you have certain conditions or if you travel or work in places where you may be exposed to hepatitis B. Haemophilus influenzae type b (Hib) vaccine  You may need this if you have certain conditions. You may receive vaccines as individual doses or as more than one vaccine together in one shot (combination vaccines). Talk with your health care provider about the risks and benefits of combination vaccines. What tests do I need?  Blood tests  Lipid and cholesterol levels. These may be checked every 5 years starting at age 20.  Hepatitis C test.  Hepatitis B test. Screening  Diabetes screening. This is done by checking your blood sugar (glucose) after you have not eaten for a while (fasting).  Sexually transmitted disease (STD) testing.  BRCA-related cancer screening. This may be done if you have a family history of breast, ovarian, tubal, or peritoneal cancers.  Pelvic exam and Pap test. This may be done every 3 years starting at age 21. Starting at age 30, this may be done every 5 years if you have a Pap test in combination with an HPV test. Talk with your health care provider about your test results, treatment options, and if necessary, the need for more tests.   Follow these instructions at home: Eating and drinking   Eat a diet that includes fresh fruits and vegetables, whole grains, lean protein, and low-fat dairy.  Take vitamin and mineral supplements as recommended by your health care provider.  Do not drink alcohol if: ? Your health care provider tells you not to drink. ? You are  pregnant, may be pregnant, or are planning to become pregnant.  If you drink alcohol: ? Limit how much you have to 0-1 drink a day. ? Be aware of how much alcohol is in your drink. In the [place] one drink equals one 12 oz bottle of beer (355 mL), one 5 oz glass of wine (148 mL), or one 1 oz glass of hard liquor (44 mL). Lifestyle  Take daily care of your teeth and gums.  Stay active. Exercise for at least 30 minutes on 5 or more days each week.  Do not use any products that contain nicotine or tobacco, such as cigarettes, e-cigarettes, and chewing tobacco. If you need help quitting, ask your health care provider.  If you are sexually active, practice safe sex. Use a condom or other form of birth control (contraception) in order to prevent pregnancy and STIs (sexually transmitted infections). If you plan to become pregnant, see your health care provider for a preconception visit. What's next?  Visit your health care provider once a year for a well check visit.  Ask your health care provider how often you should have your eyes and teeth checked.  Stay up to date on all vaccines. This information is not intended to replace advice given to you by your health care provider. Make sure you discuss any questions you have with your health care provider. Document Revised: 07/16/2018 Document Reviewed: 07/16/2018 Elsevier Patient Education  2020 [employer].

## 2020-02-24 LAB — VITAMIN B1: Vitamin B1 (Thiamine): 83 nmol/L — ABNORMAL HIGH (ref 8–30)

## 2020-02-24 LAB — HEMOGLOBIN A1C
Hgb A1c MFr Bld: 5.7 % of total Hgb — ABNORMAL HIGH (ref <5.7)
Mean Plasma Glucose: 117 (calc)
eAG (mmol/L): 6.5 (calc)

## 2020-02-24 LAB — LIPID PANEL
Cholesterol: 187 mg/dL (ref <200)
HDL: 45 mg/dL — ABNORMAL LOW (ref >=50)
LDL Cholesterol (Calc): 112 mg/dL (calc) — ABNORMAL HIGH
Non-HDL Cholesterol (Calc): 142 mg/dL (calc) — ABNORMAL HIGH (ref <130)
Total CHOL/HDL Ratio: 4.2 (calc) (ref <5.0)
Triglycerides: 186 mg/dL — ABNORMAL HIGH (ref <150)

## 2020-02-24 LAB — CBC WITH DIFFERENTIAL/PLATELET
Absolute Monocytes: 397 cells/uL (ref 200–950)
Basophils Absolute: 49 cells/uL (ref 0–200)
Basophils Relative: 0.8 %
Eosinophils Absolute: 207 cells/uL (ref 15–500)
Eosinophils Relative: 3.4 %
HCT: 38.8 % (ref 35.0–45.0)
Hemoglobin: 12.8 g/dL (ref 11.7–15.5)
Lymphs Abs: 2294 cells/uL (ref 850–3900)
MCH: 26.9 pg — ABNORMAL LOW (ref 27.0–33.0)
MCHC: 33 g/dL (ref 32.0–36.0)
MCV: 81.7 fL (ref 80.0–100.0)
MPV: 10.8 fL (ref 7.5–12.5)
Monocytes Relative: 6.5 %
Neutro Abs: 3154 cells/uL (ref 1500–7800)
Neutrophils Relative %: 51.7 %
Platelets: 289 10*3/uL (ref 140–400)
RBC: 4.75 10*6/uL (ref 3.80–5.10)
RDW: 13.6 % (ref 11.0–15.0)
Total Lymphocyte: 37.6 %
WBC: 6.1 10*3/uL (ref 3.8–10.8)

## 2020-02-24 LAB — COMPLETE METABOLIC PANEL WITH GFR
AG Ratio: 1.4 (calc) (ref 1.0–2.5)
ALT: 21 U/L (ref 6–29)
AST: 15 U/L (ref 10–30)
Albumin: 4.2 g/dL (ref 3.6–5.1)
Alkaline phosphatase (APISO): 63 U/L (ref 31–125)
BUN: 11 mg/dL (ref 7–25)
CO2: 28 mmol/L (ref 20–32)
Calcium: 9.7 mg/dL (ref 8.6–10.2)
Chloride: 100 mmol/L (ref 98–110)
Creat: 0.5 mg/dL (ref 0.50–1.10)
GFR, Est African American: 145 mL/min/{1.73_m2} (ref >=60)
GFR, Est Non African American: 125 mL/min/{1.73_m2} (ref >=60)
Globulin: 3 g/dL (calc) (ref 1.9–3.7)
Glucose, Bld: 97 mg/dL (ref 65–99)
Potassium: 4.2 mmol/L (ref 3.5–5.3)
Sodium: 137 mmol/L (ref 135–146)
Total Bilirubin: 0.4 mg/dL (ref 0.2–1.2)
Total Protein: 7.2 g/dL (ref 6.1–8.1)

## 2020-02-24 LAB — VITAMIN B12: Vitamin B-12: >2000 pg/mL — ABNORMAL HIGH (ref 200–1100)

## 2020-02-24 LAB — VITAMIN E
Gamma-Tocopherol (Vit E): <1 mg/L (ref <=4.3)
Vitamin E (Alpha Tocopherol): 13.3 mg/L (ref 5.7–19.9)

## 2020-02-24 LAB — VITAMIN A: Vitamin A (Retinoic Acid): 52 ug/dL (ref 38–98)

## 2020-02-24 LAB — VITAMIN D 25 HYDROXY (VIT D DEFICIENCY, FRACTURES): Vit D, 25-Hydroxy: 23 ng/mL — ABNORMAL LOW (ref 30–100)

## 2020-02-24 LAB — ZINC: Zinc: 116 ug/dL (ref 60–130)

## 2020-02-24 LAB — FERRITIN: Ferritin: 14 ng/mL — ABNORMAL LOW (ref 16–154)

## 2020-02-24 LAB — PTH, INTACT AND CALCIUM
Calcium: 9.7 mg/dL (ref 8.6–10.2)
PTH: 38 pg/mL (ref 14–64)

## 2020-02-24 LAB — MAGNESIUM: Magnesium: 1.9 mg/dL (ref 1.5–2.5)

## 2020-02-24 LAB — FOLATE: Folate: >24 ng/mL

## 2020-02-24 LAB — IRON: Iron: 110 ug/dL (ref 40–190)

## 2020-02-24 LAB — COPPER, SERUM: Copper: 155 ug/dL (ref 70–175)

## 2020-02-24 LAB — VITAMIN K1, SERUM: Vitamin K: 887 pg/mL (ref 130–1500)

## 2020-02-24 LAB — TSH: TSH: 1.44 mIU/L

## 2020-12-19 DIAGNOSIS — E65 Localized adiposity: Secondary | ICD-10-CM | POA: Insufficient documentation

## 2021-01-24 ENCOUNTER — Encounter: Payer: Self-pay | Admitting: Family Medicine

## 2021-02-09 ENCOUNTER — Ambulatory Visit (INDEPENDENT_AMBULATORY_CARE_PROVIDER_SITE_OTHER): Payer: 59 | Admitting: Physician Assistant

## 2021-02-09 ENCOUNTER — Encounter: Payer: Self-pay | Admitting: Physician Assistant

## 2021-02-09 ENCOUNTER — Other Ambulatory Visit: Payer: Self-pay

## 2021-02-09 VITALS — BP 128/74 | HR 82 | Temp 98.4°F | Resp 18 | Ht 62.0 in | Wt 307.3 lb

## 2021-02-09 DIAGNOSIS — Z6841 Body Mass Index (BMI) 40.0 and over, adult: Secondary | ICD-10-CM | POA: Diagnosis not present

## 2021-02-09 DIAGNOSIS — G43009 Migraine without aura, not intractable, without status migrainosus: Secondary | ICD-10-CM | POA: Diagnosis not present

## 2021-02-09 DIAGNOSIS — E1169 Type 2 diabetes mellitus with other specified complication: Secondary | ICD-10-CM

## 2021-02-09 DIAGNOSIS — L732 Hidradenitis suppurativa: Secondary | ICD-10-CM

## 2021-02-09 DIAGNOSIS — E669 Obesity, unspecified: Secondary | ICD-10-CM

## 2021-02-09 DIAGNOSIS — J4521 Mild intermittent asthma with (acute) exacerbation: Secondary | ICD-10-CM | POA: Diagnosis not present

## 2021-02-09 DIAGNOSIS — Z0000 Encounter for general adult medical examination without abnormal findings: Secondary | ICD-10-CM | POA: Diagnosis not present

## 2021-02-09 DIAGNOSIS — R002 Palpitations: Secondary | ICD-10-CM | POA: Insufficient documentation

## 2021-02-09 DIAGNOSIS — Z9884 Bariatric surgery status: Secondary | ICD-10-CM | POA: Diagnosis not present

## 2021-02-09 DIAGNOSIS — Z8719 Personal history of other diseases of the digestive system: Secondary | ICD-10-CM | POA: Insufficient documentation

## 2021-02-09 DIAGNOSIS — R110 Nausea: Secondary | ICD-10-CM

## 2021-02-09 DIAGNOSIS — L989 Disorder of the skin and subcutaneous tissue, unspecified: Secondary | ICD-10-CM

## 2021-02-09 MED ORDER — ELETRIPTAN HYDROBROMIDE 20 MG PO TABS
ORAL_TABLET | ORAL | 5 refills | Status: DC
Start: 2021-02-09 — End: 2022-12-31

## 2021-02-09 MED ORDER — ONDANSETRON 4 MG PO TBDP: 4.0000 mg | ORAL_TABLET | Freq: Three times a day (TID) | ORAL | 0 refills | Status: DC | PRN

## 2021-02-09 MED ORDER — ALBUTEROL SULFATE HFA 108 (90 BASE) MCG/ACT IN AERS: 2.0000 | INHALATION_SPRAY | RESPIRATORY_TRACT | 2 refills | Status: DC | PRN

## 2021-02-09 NOTE — Progress Notes (Signed)
Complete physical exam   Patient: Tanya Reeves   DOB: Sep 03, 1986   35 y.o. Female  MRN: [id number] Visit Date: 02/09/2021  Today's healthcare provider: Trey Sailors, PA-C   Chief Complaint  Patient presents with  . bloodwork    For gastric bypass followup yearly   Subjective    Tanya Reeves is a 35 y.o. female who presents today for a complete physical exam.  She reports consuming a general diet. Home exercise routine includes walking . She generally feels well. She reports sleeping well. She does not have additional problems to discuss today.  HPI HPI    bloodwork    Comments: For gastric bypass followup yearly       Last edited by Marcos Eke, CMA on 02/09/2021 10:33 AM. (History)      Migraines:  Occasional migraines, started at age 24. Takes relpax when needed, which is infrequent. Knows her triggers which include lack of stress and lack of sleep.    Past Medical History:  Diagnosis Date  . Asthma   . Chronic hip pain   . Chronic sinusitis 10/02/2016  . Diabetes mellitus without complication (HCC)   . Hypertension   . Migraines   . Morbid obesity (HCC) 03/17/2016  . Pre-existing hypertension during pregnancy in third trimester 09/23/2014  . Shortness of breath on exertion 08/26/2016  . Sleep apnea    Past Surgical History:  Procedure Laterality Date  . CESAREAN SECTION     2  . CHOLECYSTECTOMY    . COLON SURGERY     part of bowel removed due to c-section  . sips    . STOMACH SURGERY     Social History   Socioeconomic History  . Marital status: Married    Spouse name: Marcial Pacas   . Number of children: 2  . Years of education: Not on file  . Highest education level: Associate degree: academic program  Occupational History  . Not on file  Tobacco Use  . Smoking status: Never Smoker  . Smokeless tobacco: Never Used  Vaping Use  . Vaping Use: Never used  Substance and Sexual Activity  . Alcohol use: Yes    Alcohol/week: 0.0  standard drinks    Comment: wine - 2 drinks per month  . Drug use: No  . Sexual activity: Yes    Partners: Male    Birth control/protection: Surgical    Comment: Tubal Ligation   Other Topics Concern  . Not on file  Social History Narrative  . Not on file   Social Determinants of Health   Financial Resource Strain: Not on file  Food Insecurity: Not on file  Transportation Needs: Not on file  Physical Activity: Not on file  Stress: Not on file  Social Connections: Not on file  Intimate Partner Violence: Not on file   Family Status  Relation Name Status  . Mother  Alive  . Father  Alive  . Sister 1 Alive  . Daughter  Alive  . MGM  Alive  . MGF  Alive  . PGM  Deceased       leukemia  . PGF  Deceased       anuerysm  . Daughter  Alive   Family History  Problem Relation Age of Onset  . Diabetes Mother   . Cancer Mother        femal organs  . Hypoparathyroidism Mother   . Asthma Mother   . Diabetes Father   .  Juvenile idiopathic arthritis Daughter   . Diabetes Maternal Grandmother   . Diabetes Maternal Grandfather   . Leukemia Paternal Grandmother   . Aneurysm Paternal Grandfather    Allergies  Allergen Reactions  . Cinnamon Other (See Comments)    Tongue swells up. With artificial (such as in gum/red hots)  . Dm-Guaifenesin Er   . Influenza Vaccines Hives    Hives two years in a row.     Patient Care Team: Danelle Berryapia, Leisa, PA-C as PCP - General (Family Medicine)   Medications: Outpatient Medications Prior to Visit  Medication Sig  . [DISCONTINUED] eletriptan (RELPAX) 20 MG tablet TAKE 1 TABLET BY MOUTH AS NEEDED FOR MIGRAINE OR HEADACHE. MAY REPEAT IN 2 HOURS IF HEADACHE PERSISTS OR RECURS  . [DISCONTINUED] ondansetron (ZOFRAN-ODT) 4 MG disintegrating tablet Take 1 tablet (4 mg total) by mouth every 8 (eight) hours as needed for nausea or vomiting.  . [DISCONTINUED] albuterol (PROAIR HFA) 108 (90 Base) MCG/ACT inhaler Inhale 2 puffs into the lungs every 4  (four) hours as needed.   No facility-administered medications prior to visit.    Review of Systems  All other systems reviewed and are negative.     Objective    BP 128/74   Pulse 82   Temp 98.4 F (36.9 C)   Resp 18   Ht 5\' 2"  (1.575 m)   Wt (!) 307 lb 4.8 oz (139.4 kg)   LMP 02/02/2021   SpO2 99%   BMI 56.21 kg/m    Physical Exam Constitutional:      Appearance: Normal appearance. She is obese.  Cardiovascular:     Rate and Rhythm: Normal rate and regular rhythm.     Heart sounds: Normal heart sounds.  Pulmonary:     Effort: Pulmonary effort is normal.     Breath sounds: Normal breath sounds.  Skin:    General: Skin is warm and dry.  Neurological:     Mental Status: She is alert and oriented to person, place, and time. Mental status is at baseline.  Psychiatric:        Mood and Affect: Mood normal.        Behavior: Behavior normal.       Last depression screening scores PHQ 2/9 Scores 02/09/2021 02/16/2020 03/25/2019  PHQ - 2 Score 0 0 0  PHQ- 9 Score 0 0 0   Last fall risk screening Fall Risk  02/09/2021  Falls in the past year? 0  Comment -  Number falls in past yr: 0  Injury with Fall? 0   Last Audit-C alcohol use screening Alcohol Use Disorder Test (AUDIT) 02/16/2020  Patient refused Alcohol Screening Tool -  1. How often do you have a drink containing alcohol? 0  2. How many drinks containing alcohol do you have on a typical day when you are drinking? 0  3. How often do you have six or more drinks on one occasion? 0  AUDIT-C Score 0   A score of 3 or more in women, and 4 or more in men indicates increased risk for alcohol abuse, EXCEPT if all of the points are from question 1   Results for orders placed or performed in visit on 02/09/21  Comprehensive Metabolic Panel (CMET)  Result Value Ref Range   Glucose, Bld 85 65 - 99 mg/dL   BUN 9 7 - 25 mg/dL   Creat 2.530.52 6.640.50 - 4.031.10 mg/dL   BUN/Creatinine Ratio NOT APPLICABLE 6 - 22 (calc)   Sodium  139 135 - 146 mmol/L   Potassium 4.3 3.5 - 5.3 mmol/L   Chloride 104 98 - 110 mmol/L   CO2 24 20 - 32 mmol/L   Calcium 9.2 8.6 - 10.2 mg/dL   Total Protein 7.2 6.1 - 8.1 g/dL   Albumin 4.3 3.6 - 5.1 g/dL   Globulin 2.9 1.9 - 3.7 g/dL (calc)   AG Ratio 1.5 1.0 - 2.5 (calc)   Total Bilirubin 0.3 0.2 - 1.2 mg/dL   Alkaline phosphatase (APISO) 58 31 - 125 U/L   AST 13 10 - 30 U/L   ALT 17 6 - 29 U/L  Lipid Profile  Result Value Ref Range   Cholesterol 165 <200 mg/dL   HDL 41 (L) > OR = 50 mg/dL   Triglycerides 326 <712 mg/dL   LDL Cholesterol (Calc) 99 mg/dL (calc)   Total CHOL/HDL Ratio 4.0 <5.0 (calc)   Non-HDL Cholesterol (Calc) 124 <130 mg/dL (calc)  HgB W5Y  Result Value Ref Range   Hgb A1c MFr Bld 5.9 (H) <5.7 % of total Hgb   Mean Plasma Glucose 123 mg/dL   eAG (mmol/L) 6.8 mmol/L  Magnesium  Result Value Ref Range   Magnesium 2.1 1.5 - 2.5 mg/dL  Vitamin D (25 hydroxy)  Result Value Ref Range   Vit D, 25-Hydroxy 29 (L) 30 - 100 ng/mL  B12 and Folate Panel  Result Value Ref Range   Vitamin B-12 1,911 (H) 200 - 1,100 pg/mL   Folate 16.2 ng/mL  Fe+TIBC+Fer  Result Value Ref Range   Iron 42 40 - 190 mcg/dL   TIBC 099 833 - 825 mcg/dL (calc)   %SAT 10 (L) 16 - 45 % (calc)   Ferritin 8 (L) 16 - 154 ng/mL  TSH  Result Value Ref Range   TSH 1.33 mIU/L  Zinc  Result Value Ref Range   Zinc 81 60 - 130 mcg/dL  Copper, serum  Result Value Ref Range   Copper 135 70 - 175 mcg/dL  PTH, Intact and Calcium  Result Value Ref Range   PTH 60 16 - 77 pg/mL   Calcium 9.2 8.6 - 10.2 mg/dL  Urine Microalbumin w/creat. ratio  Result Value Ref Range   Creatinine, Urine 104 20 - 275 mg/dL   Microalb, Ur 05.3 mg/dL   Microalb Creat Ratio 123 (H) <30 mcg/mg creat  CBC with Differential/Platelet  Result Value Ref Range   WBC 6.2 3.8 - 10.8 Thousand/uL   RBC 4.55 3.80 - 5.10 Million/uL   Hemoglobin 11.7 11.7 - 15.5 g/dL   HCT 97.6 73.4 - 19.3 %   MCV 80.4 80.0 - 100.0 fL   MCH  25.7 (L) 27.0 - 33.0 pg   MCHC 32.0 32.0 - 36.0 g/dL   RDW 79.0 24.0 - 97.3 %   Platelets 320 140 - 400 Thousand/uL   MPV 10.5 7.5 - 12.5 fL   Neutro Abs 3,143 1,500 - 7,800 cells/uL   Lymphs Abs 2,399 850 - 3,900 cells/uL   Absolute Monocytes 422 200 - 950 cells/uL   Eosinophils Absolute 198 15 - 500 cells/uL   Basophils Absolute 37 0 - 200 cells/uL   Neutrophils Relative % 50.7 %   Total Lymphocyte 38.7 %   Monocytes Relative 6.8 %   Eosinophils Relative 3.2 %   Basophils Relative 0.6 %    Assessment & Plan    Routine Health Maintenance and Physical Exam  Exercise Activities and Dietary recommendations Goals   None  Immunization History  Administered Date(s) Administered  . Influenza,inj,Quad PF,6+ Mos 08/18/2014, 07/18/2017  . Pneumococcal Polysaccharide-23 07/18/2017  . Tdap 10/24/2014    Health Maintenance  Topic Date Due  . Hepatitis C Screening  Never done  . FOOT EXAM  12/26/2019  . COVID-19 Vaccine (1) 02/25/2021 (Originally 07/03/1991)  . PAP SMEAR-Modifier  07/13/2021  . HEMOGLOBIN A1C  08/12/2021  . OPHTHALMOLOGY EXAM  10/18/2021  . URINE MICROALBUMIN  02/09/2022  . TETANUS/TDAP  01/16/2025  . PNEUMOCOCCAL POLYSACCHARIDE VACCINE AGE 11-64 HIGH RISK  Completed  . HIV Screening  Completed  . HPV VACCINES  Aged Out  . INFLUENZA VACCINE  Discontinued    Discussed health benefits of physical activity, and encouraged her to engage in regular exercise appropriate for her age and condition.  1. Annual physical exam   2. BMI 50.0-59.9, adult (HCC)   3. Status post biliopancreatic diversion with duodenal switch  - CBC With Differential - Comprehensive Metabolic Panel (CMET) - Lipid Profile - HgB A1c - Magnesium - Vitamin D (25 hydroxy) - Vitamin B1 - B12 and Folate Panel - Fe+TIBC+Fer - TSH - Vitamin A - Vitamin E - Vitamin K1, Serum - Zinc - Copper, serum - PTH, Intact and Calcium  4. Diabetes mellitus type 2 in obese (HCC)  - Urine  Microalbumin w/creat. ratio  5. History of small bowel obstruction   6. Migraine without aura and without status migrainosus, not intractable  - eletriptan (RELPAX) 20 MG tablet; TAKE 1 TABLET BY MOUTH AS NEEDED FOR MIGRAINE OR HEADACHE. MAY REPEAT IN 2 HOURS IF HEADACHE PERSISTS OR RECURS  Dispense: 6 tablet; Refill: 5  7. Nausea  - ondansetron (ZOFRAN-ODT) 4 MG disintegrating tablet; Take 1 tablet (4 mg total) by mouth every 8 (eight) hours as needed for nausea or vomiting.  Dispense: 20 tablet; Refill: 0  8. Mild intermittent asthma with exacerbation  - albuterol (PROAIR HFA) 108 (90 Base) MCG/ACT inhaler; Inhale 2 puffs into the lungs every 4 (four) hours as needed.  Dispense: 1 each; Refill: 2  9. Skin lesion   10. Hidradenitis suppurativa   11. Palpitations    Return in about 1 year (around 02/09/2022) for CPE and chronic .        Trey Sailors, PA-C  [facility] [phone] (phone) [phone] (fax)  [facility]

## 2021-02-09 NOTE — Patient Instructions (Signed)
Health Maintenance, Female Adopting a healthy lifestyle and getting preventive care are important in promoting health and wellness. Ask your health care provider about:  The right schedule for you to have regular tests and exams.  Things you can do on your own to prevent diseases and keep yourself healthy. What should I know about diet, weight, and exercise? Eat a healthy diet  Eat a diet that includes plenty of vegetables, fruits, low-fat dairy products, and lean protein.  Do not eat a lot of foods that are high in solid fats, added sugars, or sodium.   Maintain a healthy weight Body mass index (BMI) is used to identify weight problems. It estimates body fat based on height and weight. Your health care provider can help determine your BMI and help you achieve or maintain a healthy weight. Get regular exercise Get regular exercise. This is one of the most important things you can do for your health. Most adults should:  Exercise for at least 150 minutes each week. The exercise should increase your heart rate and make you sweat (moderate-intensity exercise).  Do strengthening exercises at least twice a week. This is in addition to the moderate-intensity exercise.  Spend less time sitting. Even light physical activity can be beneficial. Watch cholesterol and blood lipids Have your blood tested for lipids and cholesterol at 37 years of age, then have this test every 5 years. Have your cholesterol levels checked more often if:  Your lipid or cholesterol levels are high.  You are older than 37 years of age.  You are at high risk for heart disease. What should I know about cancer screening? Depending on your health history and family history, you may need to have cancer screening at various ages. This may include screening for:  Breast cancer.  Cervical cancer.  Colorectal cancer.  Skin cancer.  Lung cancer. What should I know about heart disease, diabetes, and high blood  pressure? Blood pressure and heart disease  High blood pressure causes heart disease and increases the risk of stroke. This is more likely to develop in people who have high blood pressure readings, are of African descent, or are overweight.  Have your blood pressure checked: ? Every 3-5 years if you are 18-39 years of age. ? Every year if you are 40 years old or older. Diabetes Have regular diabetes screenings. This checks your fasting blood sugar level. Have the screening done:  Once every three years after age 40 if you are at a normal weight and have a low risk for diabetes.  More often and at a younger age if you are overweight or have a high risk for diabetes. What should I know about preventing infection? Hepatitis B If you have a higher risk for hepatitis B, you should be screened for this virus. Talk with your health care provider to find out if you are at risk for hepatitis B infection. Hepatitis C Testing is recommended for:  Everyone born from 1945 through 1965.  Anyone with known risk factors for hepatitis C. Sexually transmitted infections (STIs)  Get screened for STIs, including gonorrhea and chlamydia, if: ? You are sexually active and are younger than 37 years of age. ? You are older than 37 years of age and your health care provider tells you that you are at risk for this type of infection. ? Your sexual activity has changed since you were last screened, and you are at increased risk for chlamydia or gonorrhea. Ask your health care provider   if you are at risk.  Ask your health care provider about whether you are at high risk for HIV. Your health care provider may recommend a prescription medicine to help prevent HIV infection. If you choose to take medicine to prevent HIV, you should first get tested for HIV. You should then be tested every 3 months for as long as you are taking the medicine. Pregnancy  If you are about to stop having your period (premenopausal) and  you may become pregnant, seek counseling before you get pregnant.  Take 400 to 800 micrograms (mcg) of folic acid every day if you become pregnant.  Ask for birth control (contraception) if you want to prevent pregnancy. Osteoporosis and menopause Osteoporosis is a disease in which the bones lose minerals and strength with aging. This can result in bone fractures. If you are 65 years old or older, or if you are at risk for osteoporosis and fractures, ask your health care provider if you should:  Be screened for bone loss.  Take a calcium or vitamin D supplement to lower your risk of fractures.  Be given hormone replacement therapy (HRT) to treat symptoms of menopause. Follow these instructions at home: Lifestyle  Do not use any products that contain nicotine or tobacco, such as cigarettes, e-cigarettes, and chewing tobacco. If you need help quitting, ask your health care provider.  Do not use street drugs.  Do not share needles.  Ask your health care provider for help if you need support or information about quitting drugs. Alcohol use  Do not drink alcohol if: ? Your health care provider tells you not to drink. ? You are pregnant, may be pregnant, or are planning to become pregnant.  If you drink alcohol: ? Limit how much you use to 0-1 drink a day. ? Limit intake if you are breastfeeding.  Be aware of how much alcohol is in your drink. In the [place] one drink equals one 12 oz bottle of beer (355 mL), one 5 oz glass of wine (148 mL), or one 1 oz glass of hard liquor (44 mL). General instructions  Schedule regular health, dental, and eye exams.  Stay current with your vaccines.  Tell your health care provider if: ? You often feel depressed. ? You have ever been abused or do not feel safe at home. Summary  Adopting a healthy lifestyle and getting preventive care are important in promoting health and wellness.  Follow your health care provider's instructions about healthy  diet, exercising, and getting tested or screened for diseases.  Follow your health care provider's instructions on monitoring your cholesterol and blood pressure. This information is not intended to replace advice given to you by your health care provider. Make sure you discuss any questions you have with your health care provider. Document Revised: 10/28/2018 Document Reviewed: 10/28/2018 Elsevier Patient Education  2021 [employer]  

## 2021-02-24 LAB — VITAMIN E
Gamma-Tocopherol (Vit E): 1.2 mg/L (ref <=4.3)
Vitamin E (Alpha Tocopherol): 11.7 mg/L (ref 5.7–19.9)

## 2021-02-24 LAB — CBC WITH DIFFERENTIAL/PLATELET
Absolute Monocytes: 422 cells/uL (ref 200–950)
Basophils Absolute: 37 cells/uL (ref 0–200)
Basophils Relative: 0.6 %
Eosinophils Absolute: 198 cells/uL (ref 15–500)
Eosinophils Relative: 3.2 %
HCT: 36.6 % (ref 35.0–45.0)
Hemoglobin: 11.7 g/dL (ref 11.7–15.5)
Lymphs Abs: 2399 cells/uL (ref 850–3900)
MCH: 25.7 pg — ABNORMAL LOW (ref 27.0–33.0)
MCHC: 32 g/dL (ref 32.0–36.0)
MCV: 80.4 fL (ref 80.0–100.0)
MPV: 10.5 fL (ref 7.5–12.5)
Monocytes Relative: 6.8 %
Neutro Abs: 3143 cells/uL (ref 1500–7800)
Neutrophils Relative %: 50.7 %
Platelets: 320 10*3/uL (ref 140–400)
RBC: 4.55 10*6/uL (ref 3.80–5.10)
RDW: 13.7 % (ref 11.0–15.0)
Total Lymphocyte: 38.7 %
WBC: 6.2 10*3/uL (ref 3.8–10.8)

## 2021-02-24 LAB — PTH, INTACT AND CALCIUM
Calcium: 9.2 mg/dL (ref 8.6–10.2)
PTH: 60 pg/mL (ref 16–77)

## 2021-02-24 LAB — MICROALBUMIN / CREATININE URINE RATIO
Creatinine, Urine: 104 mg/dL (ref 20–275)
Microalb Creat Ratio: 123 mcg/mg creat — ABNORMAL HIGH (ref <30)
Microalb, Ur: 12.8 mg/dL

## 2021-02-24 LAB — IRON,TIBC AND FERRITIN PANEL
%SAT: 10 % (calc) — ABNORMAL LOW (ref 16–45)
Ferritin: 8 ng/mL — ABNORMAL LOW (ref 16–154)
Iron: 42 ug/dL (ref 40–190)
TIBC: 431 mcg/dL (calc) (ref 250–450)

## 2021-02-24 LAB — VITAMIN D 25 HYDROXY (VIT D DEFICIENCY, FRACTURES): Vit D, 25-Hydroxy: 29 ng/mL — ABNORMAL LOW (ref 30–100)

## 2021-02-24 LAB — COMPREHENSIVE METABOLIC PANEL
AG Ratio: 1.5 (calc) (ref 1.0–2.5)
ALT: 17 U/L (ref 6–29)
AST: 13 U/L (ref 10–30)
Albumin: 4.3 g/dL (ref 3.6–5.1)
Alkaline phosphatase (APISO): 58 U/L (ref 31–125)
BUN: 9 mg/dL (ref 7–25)
CO2: 24 mmol/L (ref 20–32)
Calcium: 9.2 mg/dL (ref 8.6–10.2)
Chloride: 104 mmol/L (ref 98–110)
Creat: 0.52 mg/dL (ref 0.50–1.10)
Globulin: 2.9 g/dL (calc) (ref 1.9–3.7)
Glucose, Bld: 85 mg/dL (ref 65–99)
Potassium: 4.3 mmol/L (ref 3.5–5.3)
Sodium: 139 mmol/L (ref 135–146)
Total Bilirubin: 0.3 mg/dL (ref 0.2–1.2)
Total Protein: 7.2 g/dL (ref 6.1–8.1)

## 2021-02-24 LAB — VITAMIN K1, SERUM: Vitamin K: 756 pg/mL (ref 130–1500)

## 2021-02-24 LAB — LIPID PANEL
Cholesterol: 165 mg/dL (ref <200)
HDL: 41 mg/dL — ABNORMAL LOW (ref >=50)
LDL Cholesterol (Calc): 99 mg/dL (calc)
Non-HDL Cholesterol (Calc): 124 mg/dL (calc) (ref <130)
Total CHOL/HDL Ratio: 4 (calc) (ref <5.0)
Triglycerides: 146 mg/dL (ref <150)

## 2021-02-24 LAB — HEMOGLOBIN A1C
Hgb A1c MFr Bld: 5.9 % of total Hgb — ABNORMAL HIGH (ref <5.7)
Mean Plasma Glucose: 123 mg/dL
eAG (mmol/L): 6.8 mmol/L

## 2021-02-24 LAB — B12 AND FOLATE PANEL
Folate: 16.2 ng/mL
Vitamin B-12: 1911 pg/mL — ABNORMAL HIGH (ref 200–1100)

## 2021-02-24 LAB — MAGNESIUM: Magnesium: 2.1 mg/dL (ref 1.5–2.5)

## 2021-02-24 LAB — VITAMIN B1: Vitamin B1 (Thiamine): 14 nmol/L (ref 8–30)

## 2021-02-24 LAB — TSH: TSH: 1.33 mIU/L

## 2021-02-24 LAB — VITAMIN A: Vitamin A (Retinoic Acid): 55 ug/dL (ref 38–98)

## 2021-02-24 LAB — ZINC: Zinc: 81 ug/dL (ref 60–130)

## 2021-02-24 LAB — COPPER, SERUM: Copper: 135 ug/dL (ref 70–175)

## 2021-05-14 ENCOUNTER — Encounter: Payer: Self-pay | Admitting: Family Medicine

## 2021-05-17 ENCOUNTER — Ambulatory Visit: Payer: 59 | Admitting: Unknown Physician Specialty

## 2021-05-23 NOTE — Telephone Encounter (Signed)
I said if a provider had seen her in the last 6 months they may  I have not seen her since March 2021 - so she'd need an appt for paperwork - any provider, last seen by [place]

## 2021-06-04 ENCOUNTER — Telehealth (INDEPENDENT_AMBULATORY_CARE_PROVIDER_SITE_OTHER): Payer: 59 | Admitting: Family Medicine

## 2021-06-04 ENCOUNTER — Encounter: Payer: Self-pay | Admitting: Family Medicine

## 2021-06-04 VITALS — Ht 63.0 in | Wt 307.0 lb

## 2021-06-04 DIAGNOSIS — Z6841 Body Mass Index (BMI) 40.0 and over, adult: Secondary | ICD-10-CM | POA: Diagnosis not present

## 2021-06-04 DIAGNOSIS — J4520 Mild intermittent asthma, uncomplicated: Secondary | ICD-10-CM

## 2021-06-04 DIAGNOSIS — G43009 Migraine without aura, not intractable, without status migrainosus: Secondary | ICD-10-CM

## 2021-06-04 DIAGNOSIS — Z0289 Encounter for other administrative examinations: Secondary | ICD-10-CM | POA: Diagnosis not present

## 2021-06-04 NOTE — Progress Notes (Signed)
Name: Tanya Reeves   MRN: [id number]    DOB: 1986/04/23   Date:06/04/2021       Progress Note  Subjective:    Chief Complaint  Chief Complaint  Patient presents with   paperwork    For foster care    I connected with  Leitha Bleak  on 06/04/21 at  3:00 PM EDT by a video enabled telemedicine application and verified that I am speaking with the correct person using two identifiers.  I discussed the limitations of evaluation and management by telemedicine and the availability of in person appointments. The patient expressed understanding and agreed to proceed. Staff also discussed with the patient that there may be a patient responsible charge related to this service. Patient Location: home Provider Location: [facility] clinic Additional Individuals present: none   HPI  Engineer, mining Form Reviewed Pts problem list and requested info - ongoing/chronic conditions - noted to be still Obesity, asthma, migraines as active health issues, problem list otherwise updated No need for TB skin test or other testing with low risk/no exposure No communicable diseases - not vaccinated for COVID, encouraged her to consider vaccination for this Pt denies any limitations to physical activity Denies any current behavioral and mental health issues - not on meds, no sig hx   Patient Active Problem List   Diagnosis Date Noted   History of small bowel obstruction 02/09/2021   Palpitations 02/09/2021   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   Asthma 07/18/2017   Hidradenitis suppurativa 03/24/2017   Class 3 severe obesity with body mass index (BMI) of 50.0 to 59.9 in adult (HCC) 03/17/2016   Migraine without aura 02/26/2016   OSA on CPAP 02/26/2016   Left ventricular hypertrophy 08/04/2014   Status post bariatric surgery 07/07/2014    Social History   Tobacco Use   Smoking status: Never   Smokeless tobacco: Never  Substance  Use Topics   Alcohol use: Yes    Alcohol/week: 0.0 standard drinks    Comment: wine - 2 drinks per month     Current Outpatient Medications:    albuterol (PROAIR HFA) 108 (90 Base) MCG/ACT inhaler, Inhale 2 puffs into the lungs every 4 (four) hours as needed., Disp: 1 each, Rfl: 2   eletriptan (RELPAX) 20 MG tablet, TAKE 1 TABLET BY MOUTH AS NEEDED FOR MIGRAINE OR HEADACHE. MAY REPEAT IN 2 HOURS IF HEADACHE PERSISTS OR RECURS, Disp: 6 tablet, Rfl: 5   ondansetron (ZOFRAN-ODT) 4 MG disintegrating tablet, Take 1 tablet (4 mg total) by mouth every 8 (eight) hours as needed for nausea or vomiting., Disp: 20 tablet, Rfl: 0  Allergies  Allergen Reactions   Cinnamon Other (See Comments)    Tongue swells up. With artificial (such as in gum/red hots)   Dm-Guaifenesin Er    Influenza Vaccines Hives    Hives two years in a row.     I personally reviewed active problem list, medication list, allergies, family history, social history, health maintenance, notes from last encounter, lab results, imaging with the patient/caregiver today.   Review of Systems  Constitutional: Negative.   HENT: Negative.    Eyes: Negative.   Respiratory: Negative.    Cardiovascular: Negative.   Gastrointestinal: Negative.   Endocrine: Negative.   Genitourinary: Negative.   Musculoskeletal: Negative.   Skin: Negative.   Allergic/Immunologic: Negative.   Neurological: Negative.   Hematological: Negative.   Psychiatric/Behavioral: Negative.    All other systems reviewed  and are negative.    Objective:   Virtual encounter, vitals limited, only able to obtain the following Today's Vitals   06/04/21 1205  Weight: (!) 307 lb (139.3 kg)  Height: 5\' 4"  (1.626 m)   Body mass index is 54.38 kg/m. Nursing Note and Vital Signs reviewed.  Physical Exam Vitals and nursing note reviewed.  Constitutional:      General: She is not in acute distress.    Appearance: Normal appearance. She is obese. She is not  ill-appearing, toxic-appearing or diaphoretic.  Neurological:     Mental Status: She is alert.    PE limited by virtual encounter  No results found for this or any previous visit (from the past 72 hour(s)).  Assessment and Plan:     ICD-10-CM   1. Encounter for completion of form with patient  Z02.89     2. BMI 50.0-59.9, adult (HCC)  Z68.43     3. Migraine without aura and without status migrainosus, not intractable  G43.009    relpax prn, rarely needing    4. Mild intermittent asthma, unspecified whether complicated  J45.20    well controlled, stable, usually needs rescue inhaler only one time or episode a year     Forms completed - see media tab or image for remaining Hx and documentation and work done today     -Red flags and when to present for emergency care or RTC including fever >101.35F, chest pain, shortness of breath, new/worsening/un-resolving symptoms, reviewed with patient at time of visit. Follow up and care instructions discussed and provided in AVS. - I discussed the assessment and treatment plan with the patient. The patient was provided an opportunity to ask questions and all were answered. The patient agreed with the plan and demonstrated an understanding of the instructions.  I provided 27 minutes of non-face-to-face time during this encounter.  03-16-1995, PA-C 06/04/21 3:21 PM

## 2021-10-04 ENCOUNTER — Other Ambulatory Visit: Payer: Self-pay

## 2021-10-04 ENCOUNTER — Emergency Department: Payer: 59

## 2021-10-04 ENCOUNTER — Encounter: Payer: Self-pay | Admitting: Emergency Medicine

## 2021-10-04 ENCOUNTER — Emergency Department
Admission: EM | Admit: 2021-10-04 | Discharge: 2021-10-05 | Disposition: A | Discharge status: Home / Self Care | Payer: 59 | Attending: Emergency Medicine | Admitting: Emergency Medicine

## 2021-10-04 DIAGNOSIS — K56609 Unspecified intestinal obstruction, unspecified as to partial versus complete obstruction: Secondary | ICD-10-CM | POA: Diagnosis not present

## 2021-10-04 DIAGNOSIS — I10 Essential (primary) hypertension: Secondary | ICD-10-CM | POA: Diagnosis not present

## 2021-10-04 DIAGNOSIS — J45909 Unspecified asthma, uncomplicated: Secondary | ICD-10-CM | POA: Diagnosis not present

## 2021-10-04 DIAGNOSIS — K420 Umbilical hernia with obstruction, without gangrene: Secondary | ICD-10-CM

## 2021-10-04 DIAGNOSIS — K460 Unspecified abdominal hernia with obstruction, without gangrene: Secondary | ICD-10-CM | POA: Insufficient documentation

## 2021-10-04 DIAGNOSIS — R1030 Lower abdominal pain, unspecified: Secondary | ICD-10-CM | POA: Diagnosis present

## 2021-10-04 DIAGNOSIS — E119 Type 2 diabetes mellitus without complications: Secondary | ICD-10-CM | POA: Insufficient documentation

## 2021-10-04 DIAGNOSIS — K219 Gastro-esophageal reflux disease without esophagitis: Secondary | ICD-10-CM | POA: Diagnosis not present

## 2021-10-04 DIAGNOSIS — K430 Incisional hernia with obstruction, without gangrene: Secondary | ICD-10-CM | POA: Diagnosis not present

## 2021-10-04 LAB — URINALYSIS, ROUTINE W REFLEX MICROSCOPIC
Bilirubin Urine: NEGATIVE
Glucose, UA: NEGATIVE mg/dL
Ketones, ur: 20 mg/dL — AB
Leukocytes,Ua: NEGATIVE
Nitrite: NEGATIVE
Protein, ur: 100 mg/dL — AB
Specific Gravity, Urine: 1.042 — ABNORMAL HIGH (ref 1.005–1.030)
pH: 5 (ref 5.0–8.0)

## 2021-10-04 LAB — COMPREHENSIVE METABOLIC PANEL
ALT: 22 U/L (ref 0–44)
AST: 18 U/L (ref 15–41)
Albumin: 4.4 g/dL (ref 3.5–5.0)
Alkaline Phosphatase: 72 U/L (ref 38–126)
Anion gap: 8 (ref 5–15)
BUN: 15 mg/dL (ref 6–20)
CO2: 22 mmol/L (ref 22–32)
Calcium: 9.3 mg/dL (ref 8.9–10.3)
Chloride: 105 mmol/L (ref 98–111)
Creatinine, Ser: 0.55 mg/dL (ref 0.44–1.00)
GFR, Estimated: >60 mL/min (ref >60)
Glucose, Bld: 116 mg/dL — ABNORMAL HIGH (ref 70–99)
Potassium: 3.5 mmol/L (ref 3.5–5.1)
Sodium: 135 mmol/L (ref 135–145)
Total Bilirubin: 0.7 mg/dL (ref 0.3–1.2)
Total Protein: 8.5 g/dL — ABNORMAL HIGH (ref 6.5–8.1)

## 2021-10-04 LAB — CBC
HCT: 42 % (ref 36.0–46.0)
Hemoglobin: 13.5 g/dL (ref 12.0–15.0)
MCH: 25.7 pg — ABNORMAL LOW (ref 26.0–34.0)
MCHC: 32.1 g/dL (ref 30.0–36.0)
MCV: 80 fL (ref 80.0–100.0)
Platelets: 340 10*3/uL (ref 150–400)
RBC: 5.25 MIL/uL — ABNORMAL HIGH (ref 3.87–5.11)
RDW: 14.6 % (ref 11.5–15.5)
WBC: 11.1 10*3/uL — ABNORMAL HIGH (ref 4.0–10.5)
nRBC: 0 % (ref 0.0–0.2)

## 2021-10-04 LAB — LIPASE, BLOOD: Lipase: 31 U/L (ref 11–51)

## 2021-10-04 LAB — POC URINE PREG, ED: Preg Test, Ur: NEGATIVE

## 2021-10-04 IMAGING — CT CT ABD-PELV W/ CM
2 of 4 series · 17 of 46 positions shown, 19 images · IV contrast (APPLIED)
Comparison: [DATE]

CLINICAL DATA: Abdominal pain

EXAM:
CT ABDOMEN AND PELVIS WITH CONTRAST
TECHNIQUE: Multidetector CT imaging of the abdomen and pelvis was performed
using the standard protocol following bolus administration of
intravenous contrast.
CONTRAST:  100mL OMNIPAQUE IOHEXOL 300 MG/ML  SOLN

[Series 2: routine abd/pel with · axial · 0.98mm/px · z∈[-500,-20]mm · 14 of 106 slices shown, 16 images]
[im 5/106  soft-tissue]
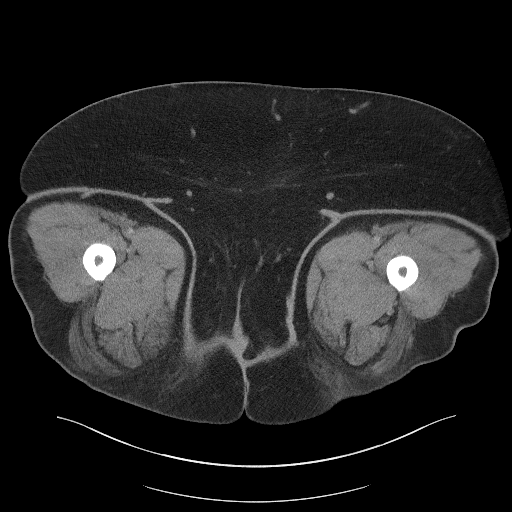
[im 5/106  bone]
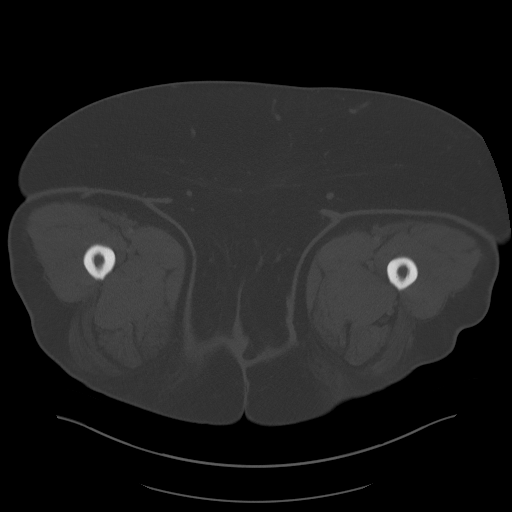
[im 14/106  soft-tissue]
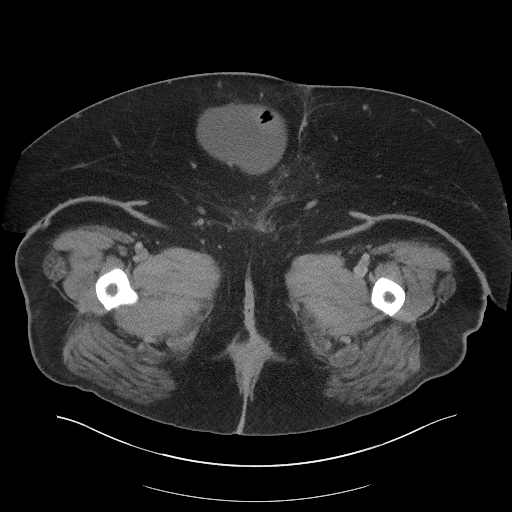
[im 19/106  soft-tissue]
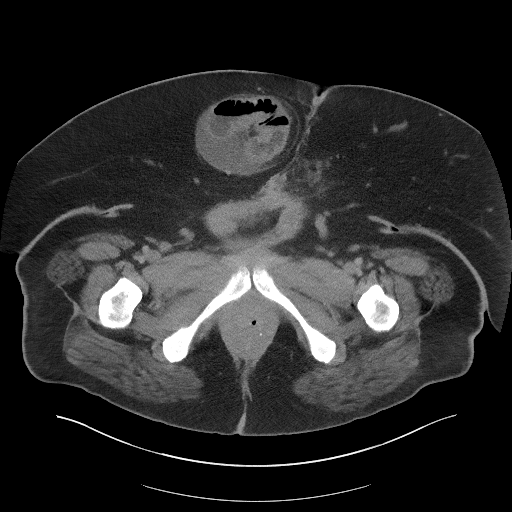
[im 28/106  soft-tissue]
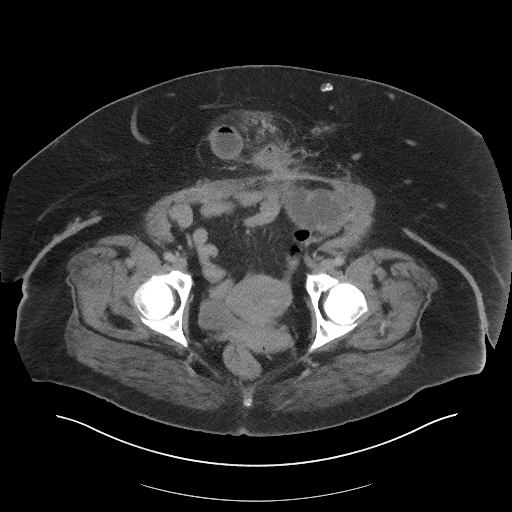
[im 37/106  soft-tissue]
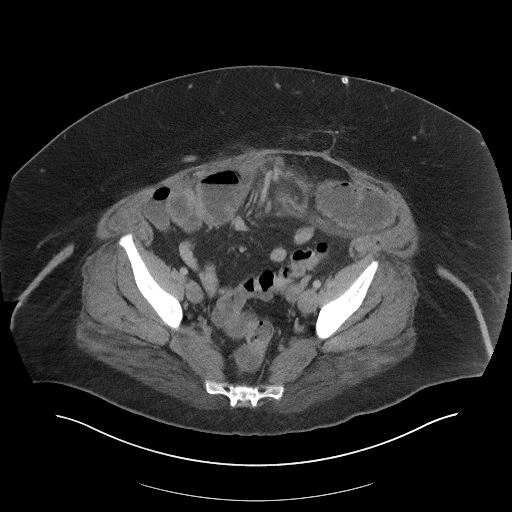
[im 42/106  soft-tissue]
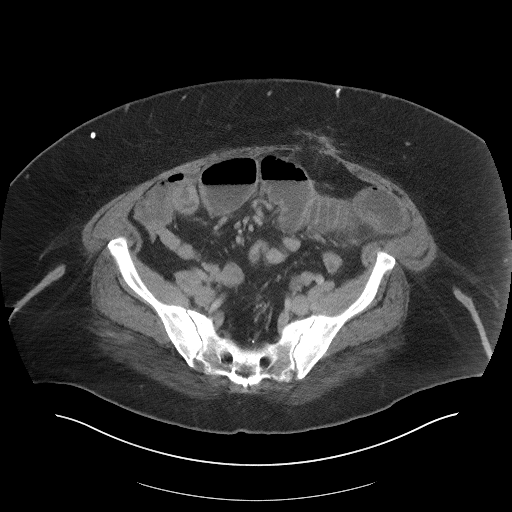
[im 51/106  soft-tissue]
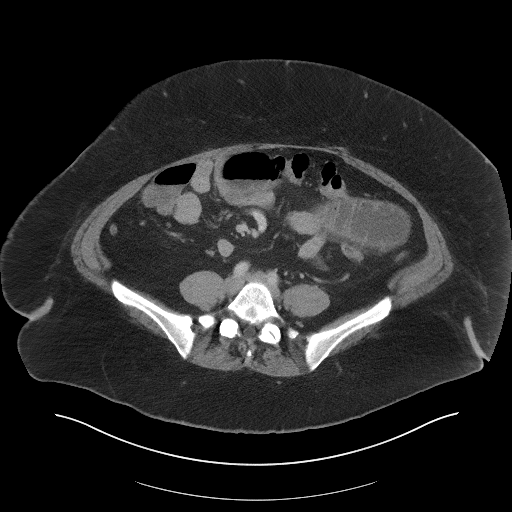
[im 55/106  soft-tissue]
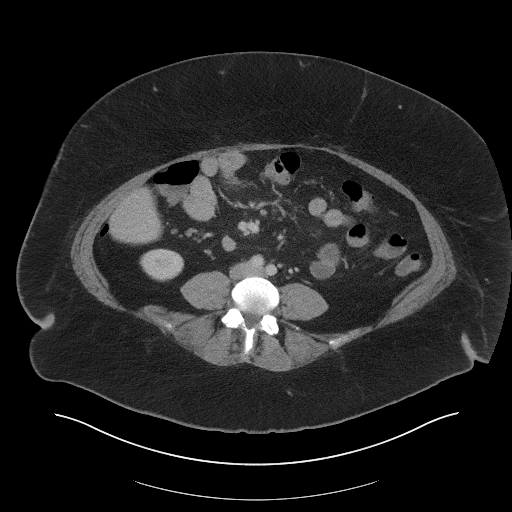
[im 64/106  soft-tissue]
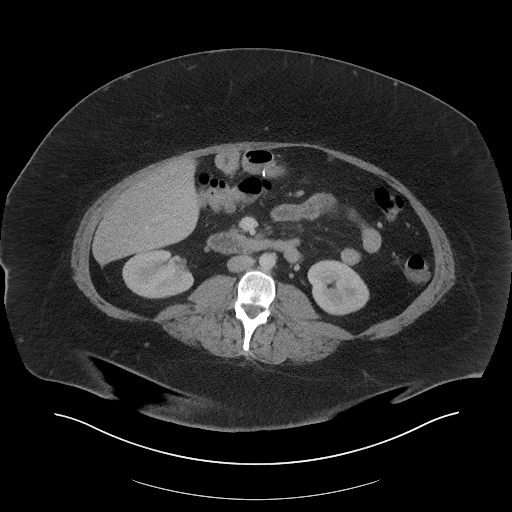
[im 64/106  bone]
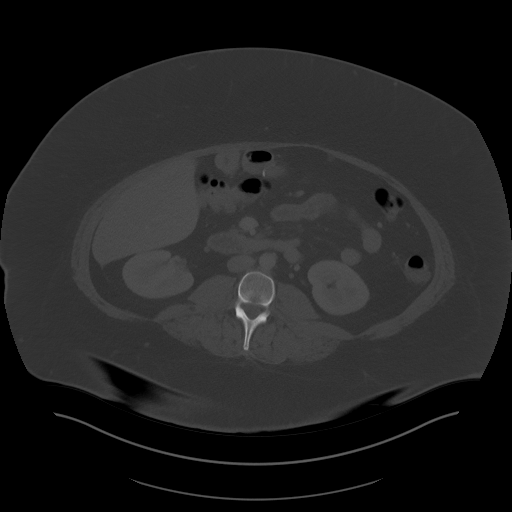
[im 69/106  soft-tissue]
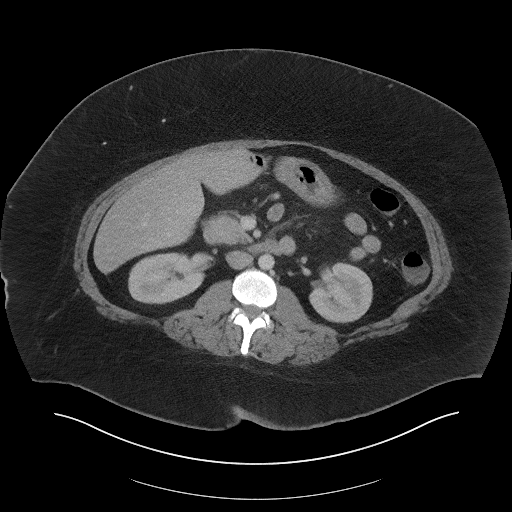
[im 78/106  soft-tissue]
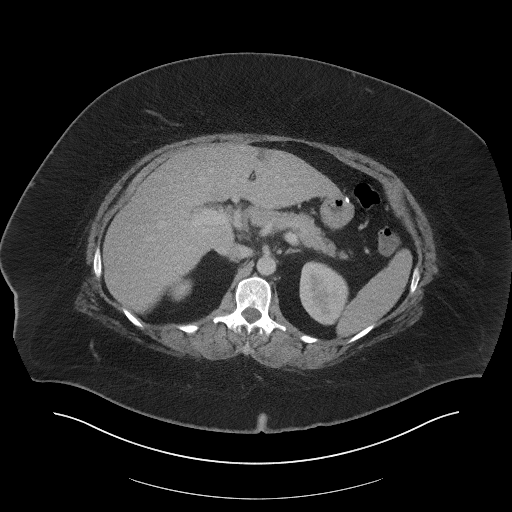
[im 87/106  soft-tissue]
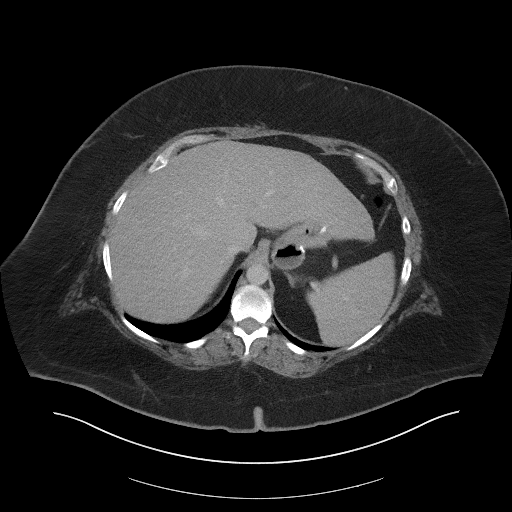
[im 92/106  soft-tissue]
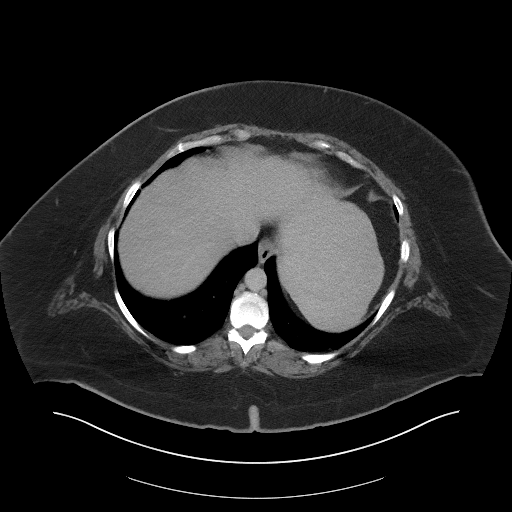
[im 101/106  soft-tissue]
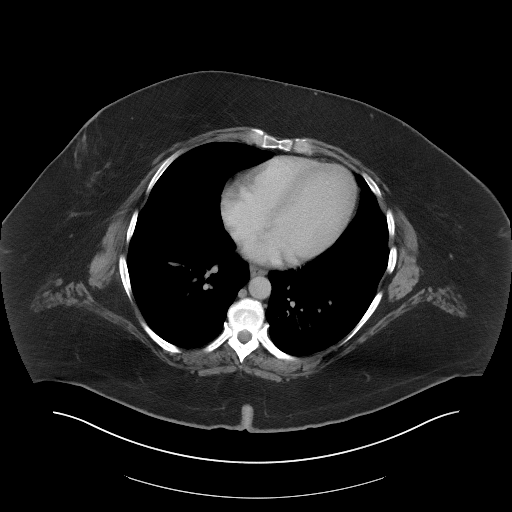

[Series 5: coronal st · coronal · 0.98mm/px · 3 of 127 slices shown]
[im 43/127  soft-tissue]
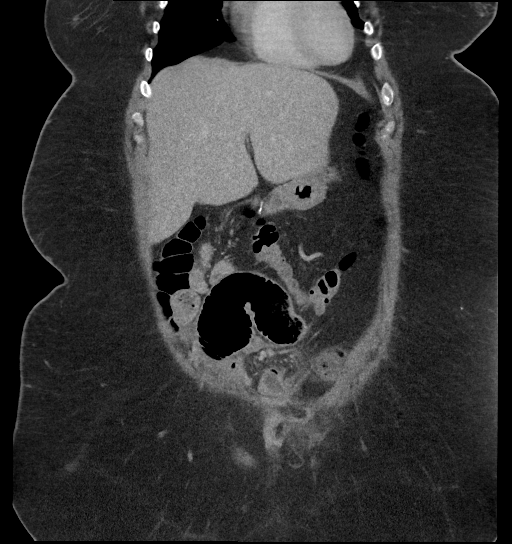
[im 57/127  soft-tissue]
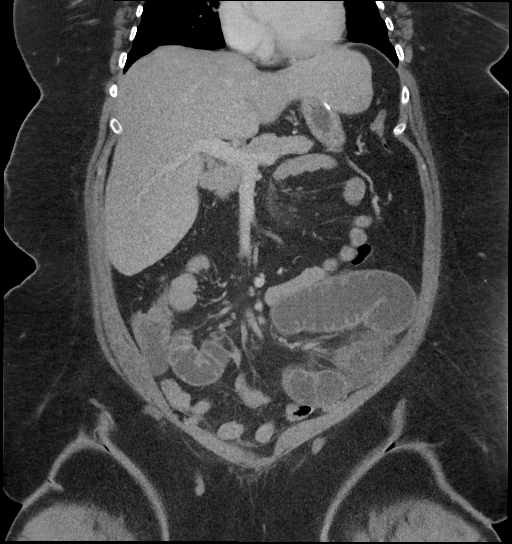
[im 71/127  soft-tissue]
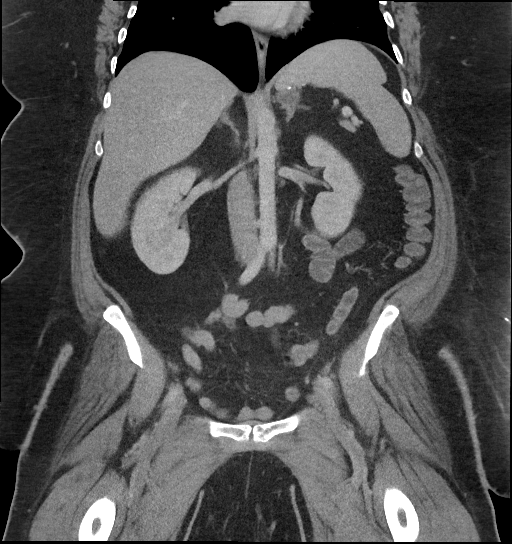

[17 of 46 positions shown; findings below may reference images not displayed]

FINDINGS: Lower chest: 6 year acute abnormalities.

Hepatobiliary: No focal liver abnormality is seen. Status post
cholecystectomy. No biliary dilatation.

Pancreas: No focal abnormality or ductal dilatation.

Spleen: 12 mm low-density lesion in the superior pole of the spleen,
likely cyst. Normal size.

Adrenals/Urinary Tract: No adrenal abnormality. No focal renal
abnormality. No stones or hydronephrosis. Urinary bladder is
unremarkable.

Stomach/Bowel: There is an infraumbilical ventral hernia which
contains small bowel loops. Dilated small bowel loops proximal to
the herniated small bowel loops compatible with small bowel
obstruction. Changes of prior gastric sleeve. Large bowel
decompressed.

Vascular/Lymphatic: No evidence of aneurysm or adenopathy.

Reproductive: Uterus and adnexa unremarkable.  No mass.

Other: Small amount of free fluid in the hernia sac.

Musculoskeletal: No acute bony abnormality.
IMPRESSION: Infraumbilical ventral hernia containing small bowel loops causing
small bowel obstruction.

## 2021-10-04 MED ORDER — ONDANSETRON HCL 4 MG/2ML IJ SOLN
4.0000 mg | Freq: Once | INTRAMUSCULAR | Status: AC
  Administered 2021-10-04: 22:00:00 4 mg via INTRAVENOUS
  Filled 2021-10-04: qty 2

## 2021-10-04 MED ORDER — IOHEXOL 300 MG/ML  SOLN
100.0000 mL | Freq: Once | INTRAMUSCULAR | Status: AC | PRN
  Administered 2021-10-04: 23:00:00 100 mL via INTRAVENOUS

## 2021-10-04 MED ORDER — MORPHINE SULFATE (PF) 4 MG/ML IV SOLN
4.0000 mg | Freq: Once | INTRAVENOUS | Status: AC
  Administered 2021-10-04: 22:00:00 4 mg via INTRAVENOUS
  Filled 2021-10-04: qty 1

## 2021-10-04 MED ORDER — SODIUM CHLORIDE 0.9 % IV BOLUS
1000.0000 mL | Freq: Once | INTRAVENOUS | Status: AC
  Administered 2021-10-04: 22:00:00 1000 mL via INTRAVENOUS

## 2021-10-04 NOTE — ED Provider Notes (Signed)
[facility] Emergency Department Provider Note  Time seen: 9:42 PM  I have reviewed the triage vital signs and the nursing notes.   HISTORY  Chief Complaint Abdominal Pain   HPI Tanya Reeves is a 35 y.o. female with a past medical history of asthma, diabetes, hypertension, obesity, presents to the emergency department for abdominal pain according to the patient for the past week or so she has been experiencing significant abdominal pain mostly in the midline the lower abdomen.  States the pain comes and goes but over the past 24 hours it has been occurring every 5 minutes or so and is much more severe.  Patient states a history of a small bowel obstruction approximately 6 or 7 years ago and states this feels somewhat similar although states she is still able to pass gas and has been experiencing diarrhea.  She states intermittent nausea and vomiting as well.  No known fever.  No cough or shortness of breath.  Patient is currently on her menstrual cycle as well.  Past Medical History:  Diagnosis Date   Asthma    Chronic hip pain    Chronic sinusitis 10/02/2016   Diabetes mellitus without complication ([place])    Essential hypertension, benign 02/26/2016   Gastroesophageal reflux disease without esophagitis 08/26/2018   Hypertension    Migraines    Morbid obesity ([place]) 03/17/2016   Pre-existing hypertension during pregnancy in third trimester 09/23/2014   Shortness of breath on exertion 08/26/2016   Sleep apnea     Patient Active Problem List   Diagnosis Date Noted   History of small bowel obstruction 02/09/2021   Palpitations 02/09/2021   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 Authorizing Provider  albuterol (PROAIR HFA) 108 (90 Base) MCG/ACT inhaler Inhale 2 puffs into the lungs every 4 (four) hours as needed. 02/09/21 02/09/22  Trinna Post, PA-C  eletriptan (RELPAX) 20 MG tablet TAKE 1 TABLET BY MOUTH AS NEEDED FOR MIGRAINE OR HEADACHE. MAY REPEAT IN 2 HOURS IF HEADACHE PERSISTS OR RECURS 02/09/21   Carles Collet M, PA-C  ondansetron (ZOFRAN-ODT) 4 MG disintegrating tablet Take 1 tablet (4 mg total) by mouth every 8 (eight) hours as needed for nausea or vomiting. 02/09/21   Trinna Post, PA-C    Allergies  Allergen Reactions   Cinnamon Other (See Comments)    Tongue swells up. With artificial (such as in gum/red hots)   Dm-Guaifenesin Er    Influenza Vaccines Hives    Hives two years in a row.     Family History  Problem Relation Age of Onset   Diabetes Mother    Cancer Mother        femal organs   Hypoparathyroidism Mother    Asthma Mother    Diabetes Father  Juvenile idiopathic arthritis Daughter    Diabetes Maternal Grandmother    Diabetes Maternal Grandfather    Leukemia Paternal Grandmother    Aneurysm Paternal Grandfather     Social History Social History   Tobacco Use   Smoking status: Never   Smokeless tobacco: Never  Vaping Use   Vaping Use: Never used  Substance Use Topics   Alcohol use: Yes    Alcohol/week: 0.0 standard drinks    Comment: wine - 2 drinks per month   Drug use: No    Review of Systems Constitutional: Negative for fever. Cardiovascular: Negative for chest pain. Respiratory: Negative for shortness of breath. Gastrointestinal: Positive for intermittent abdominal pain, worsening over the  past 1 week, worse over the past 24 hours described as moderate to severe sharp pains. Genitourinary: Negative for urinary compaints Musculoskeletal: Negative for musculoskeletal complaints Neurological: Negative for headache All other ROS negative  ____________________________________________   PHYSICAL EXAM:  VITAL SIGNS: ED Triage Vitals [10/04/21 1713]  Enc Vitals Group     BP 134/76     Pulse Rate 88     Resp 18     Temp 98.1 F (36.7 C)     Temp Source Oral     SpO2 95 %     Weight 300 lb (136.1 kg)     Height 5\' 3"  (1.6 m)     Head Circumference      Peak Flow      Pain Score 8     Pain Loc      Pain Edu?      Excl. in GC?    Constitutional: Alert and oriented.  Appears uncomfortable at times holding her abdomen. Eyes: Normal exam ENT      Head: Normocephalic and atraumatic.      Mouth/Throat: Mucous membranes are moist. Cardiovascular: Normal rate, regular rhythm. Respiratory: Normal respiratory effort without tachypnea nor retractions. Breath sounds are clear  Gastrointestinal: Soft, midline tenderness from the epigastrium to the suprapubic region, exam somewhat limited by body habitus.  But does not appear to have an acute abdomen. Musculoskeletal: Nontender with normal range of motion in all extremities.  Neurologic:  Normal speech and language. No gross focal neurologic deficits Skin:  Skin is warm, dry and intact.  Psychiatric: Mood and affect are normal.   ____________________________________________   RADIOLOGY  IMPRESSION:  Infraumbilical ventral hernia containing small bowel loops causing  small bowel obstruction.   ____________________________________________   INITIAL IMPRESSION / ASSESSMENT AND PLAN / ED COURSE  Pertinent labs & imaging results that were available during my care of the patient were reviewed by me and considered in my medical decision making (see chart for details).   Patient presents to the emergency department for 1 week  of nausea vomiting diarrhea intermittent abdominal pain that has been worsening and occurring more frequently over the past 24 hours.  Patient appears somewhat uncomfortable my examination, no acute abdomen, exam somewhat limited due to obesity/body habitus.  We will treat with pain and nausea medications, IV hydrate.  Patient's lab work is largely nonrevealing we will obtain CT imaging to further evaluate.  Patient agreeable to plan of care.  Differential is quite broad but would include SBO, colitis, diverticulitis, menstrual cramping, endometriosis less likely UTI or pyelonephritis.  Patient appears to have a fairly large ventral/umbilical hernia causing a small bowel obstruction.  I spoke to Dr. who has reviewed the CT images and will be in to see the patient to attempt bedside reduction,  if unable to reduce patient may require operation.  Patient care signed out to oncoming provider.  NAUTIKA LYKES was evaluated in Emergency Department on 10/04/2021 for the symptoms described in the history of present illness. She was evaluated in the context of the global COVID-19 pandemic, which necessitated consideration that the patient might be at risk for infection with the SARS-CoV-2 virus that causes COVID-19. Institutional protocols and algorithms that pertain to the evaluation of patients at risk for COVID-19 are in a state of rapid change based on information released by regulatory bodies including the CDC and federal and state organizations. These policies and algorithms were followed during the patient's care in the ED.  ____________________________________________   FINAL CLINICAL IMPRESSION(S) / ED DIAGNOSES  Abdominal pain Small bowel obstruction secondary to hernia   Harvest Dark, MD 10/04/21 2308

## 2021-10-04 NOTE — ED Triage Notes (Signed)
Pt reports that she has had abd pain for the last week, the pain has increased today she reports that it comes and goes about every 5 minutes. She has a history of small bowel obstruction and this feels similar. She also report that she has had the most painful period. She did have N/V/D the first three of her abd pain.

## 2021-10-05 ENCOUNTER — Telehealth: Payer: Self-pay

## 2021-10-05 DIAGNOSIS — K430 Incisional hernia with obstruction, without gangrene: Secondary | ICD-10-CM | POA: Insufficient documentation

## 2021-10-05 DIAGNOSIS — K56609 Unspecified intestinal obstruction, unspecified as to partial versus complete obstruction: Secondary | ICD-10-CM | POA: Diagnosis not present

## 2021-10-05 MED ORDER — HYDROCODONE-ACETAMINOPHEN 5-325 MG PO TABS: 2.0000 | ORAL_TABLET | Freq: Four times a day (QID) | ORAL | 0 refills | Status: DC | PRN

## 2021-10-05 MED ORDER — ONDANSETRON 4 MG PO TBDP: 4.0000 mg | ORAL_TABLET | Freq: Four times a day (QID) | ORAL | 0 refills | Status: DC | PRN

## 2021-10-05 NOTE — Consult Note (Signed)
Date of Consultation:  10/05/2021  Requesting Physician:  Minna Antis  Reason for Consultation:  Ventral hernia with small bowel obstruction  History of Present Illness: Tanya Reeves is a 35 y.o. female presenting to the ED with a week history of progressing lower abdominal pain.  Particularly over the past 24 hours she has felt worse cramping/spasms in the lower abdomen and this is also now associated with nausea and emesis.  She has been having flatus though decreased and also stool, but reports they are very pencil thin.  Denies any fevers, chills, chest pain, shortness of breath.  Patient presented to the emergency room as she could not tolerate the discomfort any longer.  Her work-up revealed normal electrolytes and LFTs with a mildly elevated white blood cell count of 11.1.  She had a CT scan of the abdomen pelvis which showed an infraumbilical ventral hernia containing small bowel loops causing a small bowel obstruction.  I have personally viewed the images and agree with the findings.  She also has 2 smaller ventral hernias which contain fat only.  The patient has had two c-sections which were done as midline incisions due to her body habitus.  During her last C-section, the patient reports that she also had a section of small bowel resected as it was found to be ischemic due to adhesions.  She also had weight loss surgery in January 2020 with Dr. Smitty Cords (laparoscopic duodenal switch).  She has lost a significant amount of weight and had a preoperative BMI of 68 and is down now to 53.  She does feel that she now has a lot of excess skin on her pannus and she feels that this pulls down on her hernia as well.  Past Medical History: Past Medical History:  Diagnosis Date   Asthma    Chronic hip pain    Chronic sinusitis 10/02/2016   Diabetes mellitus without complication (HCC)    Essential hypertension, benign 02/26/2016   Gastroesophageal reflux disease without esophagitis  08/26/2018   Hypertension    Migraines    Morbid obesity (HCC) 03/17/2016   Pre-existing hypertension during pregnancy in third trimester 09/23/2014   Shortness of breath on exertion 08/26/2016   Sleep apnea      Past Surgical History: Past Surgical History:  Procedure Laterality Date   CESAREAN SECTION     2   CHOLECYSTECTOMY     COLON SURGERY     part of bowel removed due to c-section   sips     STOMACH SURGERY      Home Medications: Prior to Admission medications   Medication Sig Start Date End Date Taking? Authorizing Provider  albuterol (PROAIR HFA) 108 (90 Base) MCG/ACT inhaler Inhale 2 puffs into the lungs every 4 (four) hours as needed. 02/09/21 02/09/22 Yes Pollak, Lavella Hammock, PA-C  eletriptan (RELPAX) 20 MG tablet TAKE 1 TABLET BY MOUTH AS NEEDED FOR MIGRAINE OR HEADACHE. MAY REPEAT IN 2 HOURS IF HEADACHE PERSISTS OR RECURS 02/09/21   Trey Sailors, PA-C    Allergies: Allergies  Allergen Reactions   Cinnamon Other (See Comments)    Tongue swells up. With artificial (such as in gum/red hots)   Dm-Guaifenesin Er    Influenza Vaccines Hives    Hives two years in a row.     Social History:  reports that she has never smoked. She has never used smokeless tobacco. She reports current alcohol use. She reports that she does not use drugs.   Family History:  Family History  Problem Relation Age of Onset   Diabetes Mother    Cancer Mother        femal organs   Hypoparathyroidism Mother    Asthma Mother    Diabetes Father    Juvenile idiopathic arthritis Daughter    Diabetes Maternal Grandmother    Diabetes Maternal Grandfather    Leukemia Paternal Grandmother    Aneurysm Paternal Grandfather     Review of Systems: Review of Systems  Constitutional:  Negative for chills and fever.  HENT:  Negative for hearing loss.   Respiratory:  Negative for shortness of breath.   Cardiovascular:  Negative for chest pain.  Gastrointestinal:  Positive for abdominal pain,  nausea and vomiting. Negative for constipation and diarrhea.  Genitourinary:  Negative for dysuria.  Musculoskeletal:  Negative for myalgias.  Skin:  Negative for rash.  Neurological:  Negative for dizziness.  Psychiatric/Behavioral:  Negative for depression.    Physical Exam BP (!) 120/57   Pulse 67   Temp 98.1 F (36.7 C) (Oral)   Resp 16   Ht 5\' 3"  (1.6 m)   Wt 136.1 kg   SpO2 99%   BMI 53.14 kg/m  CONSTITUTIONAL: No acute distress HEENT:  Normocephalic, atraumatic, extraocular motion intact. NECK: Trachea is midline, and there is no jugular venous distension. RESPIRATORY:  Lungs are clear, and breath sounds are equal bilaterally. Normal respiratory effort without pathologic use of accessory muscles. CARDIOVASCULAR: Heart is regular without murmurs, gallops, or rubs. GI: The abdomen is soft, obese, nondistended.  The patient does have a ventral hernia at the midline.  Carefully and with manual massage and pressure, the ventral hernia was reduced at least partially to the point of the patient's initial presenting pain has improved significantly.  I checked on the side multiple times in the area remain reduced.  An abdominal binder was placed in order to help support that area. MUSCULOSKELETAL:  Normal muscle strength and tone in all four extremities.  No peripheral edema or cyanosis. SKIN: Skin turgor is normal. There are no pathologic skin lesions.  NEUROLOGIC:  Motor and sensation is grossly normal.  Cranial nerves are grossly intact. PSYCH:  Alert and oriented to person, place and time. Affect is normal.  Laboratory Analysis: Results for orders placed or performed during the hospital encounter of 10/04/21 (from the past 24 hour(s))  Lipase, blood     Status: None   Collection Time: 10/04/21  5:16 PM  Result Value Ref Range   Lipase 31 11 - 51 U/L  Comprehensive metabolic panel     Status: Abnormal   Collection Time: 10/04/21  5:16 PM  Result Value Ref Range   Sodium 135 135  - 145 mmol/L   Potassium 3.5 3.5 - 5.1 mmol/L   Chloride 105 98 - 111 mmol/L   CO2 22 22 - 32 mmol/L   Glucose, Bld 116 (H) 70 - 99 mg/dL   BUN 15 6 - 20 mg/dL   Creatinine, Ser 10/06/21 0.44 - 1.00 mg/dL   Calcium 9.3 8.9 - 9.67 mg/dL   Total Protein 8.5 (H) 6.5 - 8.1 g/dL   Albumin 4.4 3.5 - 5.0 g/dL   AST 18 15 - 41 U/L   ALT 22 0 - 44 U/L   Alkaline Phosphatase 72 38 - 126 U/L   Total Bilirubin 0.7 0.3 - 1.2 mg/dL   GFR, Estimated 89.3 >81 mL/min   Anion gap 8 5 - 15  CBC     Status: Abnormal  Collection Time: 10/04/21  5:16 PM  Result Value Ref Range   WBC 11.1 (H) 4.0 - 10.5 K/uL   RBC 5.25 (H) 3.87 - 5.11 MIL/uL   Hemoglobin 13.5 12.0 - 15.0 g/dL   HCT 53.9 76.7 - 34.1 %   MCV 80.0 80.0 - 100.0 fL   MCH 25.7 (L) 26.0 - 34.0 pg   MCHC 32.1 30.0 - 36.0 g/dL   RDW 93.7 90.2 - 40.9 %   Platelets 340 150 - 400 K/uL   nRBC 0.0 0.0 - 0.2 %  Urinalysis, Routine w reflex microscopic Urine, Clean Catch     Status: Abnormal   Collection Time: 10/04/21 10:13 PM  Result Value Ref Range   Color, Urine AMBER (A) YELLOW   APPearance TURBID (A) CLEAR   Specific Gravity, Urine 1.042 (H) 1.005 - 1.030   pH 5.0 5.0 - 8.0   Glucose, UA NEGATIVE NEGATIVE mg/dL   Hgb urine dipstick LARGE (A) NEGATIVE   Bilirubin Urine NEGATIVE NEGATIVE   Ketones, ur 20 (A) NEGATIVE mg/dL   Protein, ur 735 (A) NEGATIVE mg/dL   Nitrite NEGATIVE NEGATIVE   Leukocytes,Ua NEGATIVE NEGATIVE   RBC / HPF 6-10 0 - 5 RBC/hpf   WBC, UA 21-50 0 - 5 WBC/hpf   Bacteria, UA RARE (A) NONE SEEN   Squamous Epithelial / LPF 6-10 0 - 5   Mucus PRESENT    Ca Oxalate Crys, UA PRESENT   POC urine preg, ED     Status: Normal   Collection Time: 10/04/21 10:13 PM  Result Value Ref Range   Preg Test, Ur Negative Negative    Imaging: CT ABDOMEN PELVIS W CONTRAST  Result Date: 10/04/2021 CLINICAL DATA:  Abdominal pain EXAM: CT ABDOMEN AND PELVIS WITH CONTRAST TECHNIQUE: Multidetector CT imaging of the abdomen and pelvis  was performed using the standard protocol following bolus administration of intravenous contrast. CONTRAST:  OMNIPAQUE IOHEXOL 300 MG/ML  SOLN COMPARISON:  08/09/2013 FINDINGS: Lower chest: 6 year acute abnormalities. Hepatobiliary: No focal liver abnormality is seen. Status post cholecystectomy. No biliary dilatation. Pancreas: No focal abnormality or ductal dilatation. Spleen: 12 mm low-density lesion in the superior pole of the spleen, likely cyst. Normal size. Adrenals/Urinary Tract: No adrenal abnormality. No focal renal abnormality. No stones or hydronephrosis. Urinary bladder is unremarkable. Stomach/Bowel: There is an infraumbilical ventral hernia which contains small bowel loops. Dilated small bowel loops proximal to the herniated small bowel loops compatible with small bowel obstruction. Changes of prior gastric sleeve. Large bowel decompressed. Vascular/Lymphatic: No evidence of aneurysm or adenopathy. Reproductive: Uterus and adnexa unremarkable.  No mass. Other: Small amount of free fluid in the hernia sac. Musculoskeletal: No acute bony abnormality. IMPRESSION: Infraumbilical ventral hernia containing small bowel loops causing small bowel obstruction. Electronically Signed   By: Charlett Nose M.D.   On: 10/04/2021 22:51    Assessment and Plan: This is a 35 y.o. female with an incisional hernia containing small bowel which caused a small bowel obstruction.  - The patient's hernia was successfully reduced at least partially at bedside in the emergency department with improvement of the patient's symptoms.  After checking on her a few different times, the area remained reduced at her symptoms remain improved.  Given this, we discussed the option for being able to discharge her home from the emergency department.  Our goal will be to set her up for office visit in order to discuss and schedule robotic assisted incisional hernia repair.  I initially had  discussed with her that it may be worthwhile  to go back to Dr. Smitty Cords since he was her last surgeon, but currently he is out of network for her insurance.  I think is perfectly fine for her to follow-up with me and we can discuss further what to do with her hernia.  Briefly, I discussed with her the role for a robotic assisted incisional hernia repair, how it is done, some of the postop activity restrictions and recovery. - An abdominal binder was placed after the hernia was successfully reduced and discussed with her that she should keep this at all times as much as possible in order to help push-up on her abdominal wall to decrease the amount of outward pressure so the hernia would protrude less.  Also discussed with her that given the findings of small bowel obstruction, that over the next 24 hours, she should only do clear liquids and then afterwards she could try advancing her diet as tolerated if she is feeling well. - Our office will contact her to set up an appointment to see me and discuss surgery. - Patient is in agreement with this plan and all of her questions have been answered.  Case discussed with Dr. Elesa Massed.   Howie Ill, MD [facility]:  [phone]

## 2021-10-05 NOTE — ED Notes (Signed)
Per MD order, patient provided PO fluid for fluid challenge Patient aware to take small, slow sips of fluid and to notify this RN of any N/V Patient is currently awake and alert, sitting up and talking in a clear voice Side rails up, call bell within reach, will continue to monitor

## 2021-10-05 NOTE — Telephone Encounter (Signed)
Spoke with patient added to schedule 10/08/21.

## 2021-10-05 NOTE — Discharge Instructions (Signed)

## 2021-10-05 NOTE — ED Provider Notes (Signed)
12:30 AM  Assumed care at shift change.  Patient here with infraumbilical ventral hernia containing small bowel loops causing a small bowel obstruction.  Seen by Dr. Aleen Campi with general surgery who was able to mostly produce her hernia which relieved her pain.  She was placed in an abdominal binder.  We will plan to observe in the emergency department and p.o. challenge prior to discharge home.  If she continues to do well, will discharge with pain and nausea medicine and close outpatient follow-up with general surgery for elective repair.   1:25 AM  Pt reports she is feeling much better.  She is hemodynamically stable, nontoxic.  Able to tolerate p.o. here.  Patient and family at bedside are comfortable with plan for discharge home with close outpatient follow-up with general surgery.  Discussed at length return precautions.  Will discharge with prescriptions of pain and nausea medicine.   At this time, I do not feel there is any life-threatening condition present. I have reviewed, interpreted and discussed all results (EKG, imaging, lab, urine as appropriate) and exam findings with patient/family. I have reviewed nursing notes and appropriate previous records.  I feel the patient is safe to be discharged home without further emergent workup and can continue workup as an outpatient as needed. Discussed usual and customary return precautions. Patient/family verbalize understanding and are comfortable with this plan.  Outpatient follow-up has been provided as needed. All questions have been answered.    Tine Mabee, Layla Maw, DO 10/05/21 0126

## 2021-10-08 ENCOUNTER — Ambulatory Visit (INDEPENDENT_AMBULATORY_CARE_PROVIDER_SITE_OTHER): Payer: 59 | Admitting: Surgery

## 2021-10-08 ENCOUNTER — Encounter: Payer: Self-pay | Admitting: Surgery

## 2021-10-08 ENCOUNTER — Other Ambulatory Visit: Payer: Self-pay

## 2021-10-08 VITALS — BP 122/87 | HR 99 | Temp 99.6°F | Ht 63.0 in | Wt 303.6 lb

## 2021-10-08 DIAGNOSIS — K430 Incisional hernia with obstruction, without gangrene: Secondary | ICD-10-CM

## 2021-10-08 MED ORDER — HYDROCODONE-ACETAMINOPHEN 5-325 MG PO TABS
2.0000 | ORAL_TABLET | Freq: Four times a day (QID) | ORAL | 0 refills | Status: DC | PRN
Start: 2021-10-08 — End: 2021-10-19

## 2021-10-08 NOTE — Patient Instructions (Addendum)
You may take Miralax 1 capful in a full glass of liquid 2-3 times a day to get your bowels moving. Once you have a bowel movement you can back this down to 1-2 time a day.   You may continue to use the binder for comfort. Be sure it is centered over your hernia.  You have requested to have a Ventral Hernia Repair. This will be done at [facility] on 10/23/21 by Dr Aleen Campi. Please see your (BLUE) Pre-care sheet for more information. Our surgery scheduler will call you to verify surgery date and to go over information.   You will need to arrange to be out of work for approximately 1-2 weeks and then you may return with a lifting restriction for 4 more weeks. If you have FMLA or Disability paperwork that needs to be filled out, please have your company fax your paperwork to [phone] or you may drop this by the office. This paperwork will be filled out within 3 days after your surgery has been completed.  Ventral Hernia  Laparoscopic Ventral Hernia Repair Laparoscopic ventral hernia repair is a surgery to fix a ventral hernia. A ventral hernia, also called an incisional hernia, is a bulge of body tissue or intestines that pushes through the front part of the abdomen. This can happen if the connective tissue covering the muscles over the abdomen has a weak spot or is torn because of a surgical cut (incision) from a previous surgery. Laparoscopic ventral hernia repair is often done soon after diagnosis to stop the hernia from getting bigger, becoming uncomfortable, or becoming an emergency. This surgery usually takes about 2 hours, but the time can vary greatly. LET [facility] CARE PROVIDER KNOW ABOUT: Any allergies you have. All medicines you are taking, including steroids, vitamins, herbs, eye drops, creams, and over-the-counter medicines. Previous problems you or members of your family have had with the use of anesthetics. Any blood disorders you have. Previous surgeries you have had. Medical conditions  you have. RISKS AND COMPLICATIONS  Generally, laparoscopic ventral hernia repair is a safe procedure. However, as with any surgical procedure, problems can occur. Possible problems include: Bleeding. Trouble passing urine or having a bowel movement after the surgery. Infection. Pneumonia. Blood clots. Pain in the area of the hernia. A bulge in the area of the hernia that may be caused by a collection of fluid. Injury to intestines or other structures in the abdomen. Return of the hernia after surgery. In some cases, your health care provider may need to stop the laparoscopic procedure and do regular, open surgery. This may be necessary for very difficult hernias, when organs are hard to see, or when bleeding problems occur during surgery. BEFORE THE PROCEDURE  You may need to have blood tests, urine tests, a chest X-ray, or an electrocardiogram done before the day of the surgery. Ask your health care provider about changing or stopping your regular medicines. This is especially important if you are taking diabetes medicines or blood thinners. You may need to wash with a special type of germ-killing soap. Do not eat or drink anything after midnight the night before the procedure or as directed by your health care provider. Make plans to have someone drive you home after the procedure. PROCEDURE  Small monitors will be put on your body. They are used to check your heart, blood pressure, and oxygen level. An IV access tube will be put into a vein in your hand or arm. Fluids and medicine will flow directly  into your body through the IV tube. You will be given medicine that makes you go to sleep (general anesthetic). Your abdomen will be cleaned with a special soap to kill any germs on your skin. Once you are asleep, several small incisions will be made in your abdomen. The large space in your abdomen will be filled with air so that it expands. This gives your health care provider more room and a  better view. A thin, lighted tube with a tiny camera on the end (laparoscope) is put through a small incision in your abdomen. The camera on the laparoscope sends a picture to a TV screen in the operating room. This gives your health care provider a good view inside your abdomen. Hollow tubes are put through the other small incisions in your abdomen. The tools needed for the procedure are put through these tubes. Your health care provider puts the tissue or intestines that formed the hernia back in place. A screen-like patch (mesh) is used to close the hernia. This helps make the area stronger. Stitches, tacks, or staples are used to keep the mesh in place. Medicine and a bandage (dressing) or skin glue will be put over the incisions. AFTER THE PROCEDURE  You will stay in a recovery area until the anesthetic wears off. Your blood pressure and pulse will be checked often. You may be able to go home the same day or may need to stay in the hospital for 1-2 days after surgery. Your health care provider will decide when you can go home. You may feel some pain. You may be given medicine for pain. You will be urged to do breathing exercises that involve taking deep breaths. This helps prevent a lung infection after a surgery. You may have to wear compression stockings while you are in the hospital. These stockings help keep blood clots from forming in your legs.   This information is not intended to replace advice given to you by your health care provider. Make sure you discuss any questions you have with your health care provider.   Document Released: 10/21/2012 Document Revised: 11/09/2013 Document Reviewed: 10/21/2012 Elsevier Interactive Patient Education [employer]

## 2021-10-08 NOTE — H&P (View-Only) (Signed)
10/08/2021  History of Present Illness: Tanya Reeves is a 35 y.o. female presenting for follow up of an incisional hernia.  She was seen in the ED on 10/04/21 with what appeared to be an incarcerated incisional hernia containing loops of small bowel, causing a small bowel obstruction.  I was able to carefully reduce the hernia at bedside, and the initial pain on presentation was much improved and she was able to be discharged home with close follow up.  She was given a prescription for pain medication and nausea medication.  The patient has been taking her pain medication intermittently, and reports still having some discomfort but not the same type of pain that brought her to the ED.  She has been wearing her abdominal binder.  Denies any nausea, but is having constipation, likely from the narcotic pain medication.  She is having flatus and denies any worsening abdominal pain or distention.  Past Medical History: Past Medical History:  Diagnosis Date   Asthma    Chronic hip pain    Chronic sinusitis 10/02/2016   Diabetes mellitus without complication (HCC)    Essential hypertension, benign 02/26/2016   Gastroesophageal reflux disease without esophagitis 08/26/2018   Hypertension    Migraines    Morbid obesity (HCC) 03/17/2016   Pre-existing hypertension during pregnancy in third trimester 09/23/2014   Shortness of breath on exertion 08/26/2016   Sleep apnea      Past Surgical History: Past Surgical History:  Procedure Laterality Date   CESAREAN SECTION     2   CHOLECYSTECTOMY     COLON SURGERY     part of bowel removed due to c-section   sips     STOMACH SURGERY      Home Medications: Prior to Admission medications   Medication Sig Start Date End Date Taking? Authorizing Provider  albuterol (PROAIR HFA) 108 (90 Base) MCG/ACT inhaler Inhale 2 puffs into the lungs every 4 (four) hours as needed. 02/09/21 02/09/22 Yes Pollak, Lavella Hammock, PA-C  eletriptan (RELPAX) 20 MG tablet  TAKE 1 TABLET BY MOUTH AS NEEDED FOR MIGRAINE OR HEADACHE. MAY REPEAT IN 2 HOURS IF HEADACHE PERSISTS OR RECURS 02/09/21  Yes Osvaldo Angst M, PA-C  ondansetron (ZOFRAN ODT) 4 MG disintegrating tablet Take 1 tablet (4 mg total) by mouth every 6 (six) hours as needed for nausea or vomiting. 10/05/21  Yes Ward, Layla Maw, DO  HYDROcodone-acetaminophen (NORCO/VICODIN) 5-325 MG tablet Take 2 tablets by mouth every 6 (six) hours as needed. 10/08/21   Henrene Dodge, MD    Allergies: Allergies  Allergen Reactions   Cinnamon Other (See Comments)    Tongue swells up. With artificial (such as in gum/red hots)   Dm-Guaifenesin Er    Influenza Vaccines Hives    Hives two years in a row.     Review of Systems: Review of Systems  Constitutional:  Negative for chills and fever.  HENT:  Negative for hearing loss.   Respiratory:  Negative for shortness of breath.   Cardiovascular:  Negative for chest pain.  Gastrointestinal:  Positive for abdominal pain and constipation. Negative for nausea and vomiting.  Genitourinary:  Negative for dysuria.  Musculoskeletal:  Negative for myalgias.  Skin:  Negative for rash.  Neurological:  Negative for dizziness.  Psychiatric/Behavioral:  Negative for depression.    Physical Exam BP 122/87   Pulse 99   Temp 99.6 F (37.6 C) (Oral)   Ht 5\' 3"  (1.6 m)   Wt (!) 303 lb 9.6  oz (137.7 kg)   SpO2 95%   BMI 53.78 kg/m  CONSTITUTIONAL: No acute distress HEENT:  Normocephalic, atraumatic, extraocular motion intact. NECK:  Trachea is midline, no jugular venous distention. RESPIRATORY:  Lungs are clear, and breath sounds are equal bilaterally. Normal respiratory effort without pathologic use of accessory muscles. CARDIOVASCULAR: Heart is regular without murmurs, gallops, or rubs. GI: The abdomen is soft, obese, non-distended, with some discomfort to palpation in the mid abdomen at the site of her hernias.  The main hernia which contained small bowel loops feels  soft, without any hard tissue or overlying skin changes, and without much discomfort with palpation.  She has two other smaller hernias which are fat containing, and these are somewhat more uncomfortable, but still not to the degree of the ED visit.  MUSCULOSKELETAL:  Normal gait, no peripheral edema NEUROLOGIC:  Motor and sensation is grossly normal.  Cranial nerves are grossly intact. PSYCH:  Alert and oriented to person, place and time. Affect is normal.  Labs/Imaging: Labs from 10/04/21: Na 135, K 3.5, Cl 105, CO2 22, BUN 15, Cr 0.55.  LFTs within normal limits.  WBC 11.1, Hgb 13.5, Hct 42, Plt 340  CT abdomen/pelvis on 10/04/21: IMPRESSION: Infraumbilical ventral hernia containing small bowel loops causing small bowel obstruction.  Assessment and Plan: This is a 35 y.o. female with an incisional ventral hernia with prior obstruction.  --Discussed with the patient again the findings on her imaging study and on exam.  Today, her main incisional hernia is softer and remains reducible compared to how she presented on 11/17.  There is still discomfort with the smaller fat-containing hernias, and these may be more incarcerated.   --Discussed with her the plan for robotic incisional hernia repair.  Reviewed the surgery at length with her, including risks of bleeding, infection, injury to surrounding structures, post-op activity restrictions, possibility for overnight hospital stay, and she's willing to proceed.  As a precaution, will have her booked as overnight stay as her surgery will be late in the afternoon and may be more extensive due to the size of her hernia and possible incarceration.  Will also send for medical clearance. --Will tentatively schedule the patient for 10/23/21.  I spent 40 minutes dedicated to the care of this patient on the date of this encounter to include pre-visit review of records, face-to-face time with the patient discussing diagnosis and management, and any post-visit  coordination of care.   Howie Ill, MD [facility]

## 2021-10-08 NOTE — Progress Notes (Signed)
10/08/2021  History of Present Illness: Tanya Reeves is a 35 y.o. female presenting for follow up of an incisional hernia.  She was seen in the ED on 10/04/21 with what appeared to be an incarcerated incisional hernia containing loops of small bowel, causing a small bowel obstruction.  I was able to carefully reduce the hernia at bedside, and the initial pain on presentation was much improved and she was able to be discharged home with close follow up.  She was given a prescription for pain medication and nausea medication.  The patient has been taking her pain medication intermittently, and reports still having some discomfort but not the same type of pain that brought her to the ED.  She has been wearing her abdominal binder.  Denies any nausea, but is having constipation, likely from the narcotic pain medication.  She is having flatus and denies any worsening abdominal pain or distention.  Past Medical History: Past Medical History:  Diagnosis Date   Asthma    Chronic hip pain    Chronic sinusitis 10/02/2016   Diabetes mellitus without complication (HCC)    Essential hypertension, benign 02/26/2016   Gastroesophageal reflux disease without esophagitis 08/26/2018   Hypertension    Migraines    Morbid obesity (HCC) 03/17/2016   Pre-existing hypertension during pregnancy in third trimester 09/23/2014   Shortness of breath on exertion 08/26/2016   Sleep apnea      Past Surgical History: Past Surgical History:  Procedure Laterality Date   CESAREAN SECTION     2   CHOLECYSTECTOMY     COLON SURGERY     part of bowel removed due to c-section   sips     STOMACH SURGERY      Home Medications: Prior to Admission medications   Medication Sig Start Date End Date Taking? Authorizing Provider  albuterol (PROAIR HFA) 108 (90 Base) MCG/ACT inhaler Inhale 2 puffs into the lungs every 4 (four) hours as needed. 02/09/21 02/09/22 Yes Pollak, Lavella Hammock, PA-C  eletriptan (RELPAX) 20 MG tablet  TAKE 1 TABLET BY MOUTH AS NEEDED FOR MIGRAINE OR HEADACHE. MAY REPEAT IN 2 HOURS IF HEADACHE PERSISTS OR RECURS 02/09/21  Yes Osvaldo Angst M, PA-C  ondansetron (ZOFRAN ODT) 4 MG disintegrating tablet Take 1 tablet (4 mg total) by mouth every 6 (six) hours as needed for nausea or vomiting. 10/05/21  Yes Ward, Layla Maw, DO  HYDROcodone-acetaminophen (NORCO/VICODIN) 5-325 MG tablet Take 2 tablets by mouth every 6 (six) hours as needed. 10/08/21   Henrene Dodge, MD    Allergies: Allergies  Allergen Reactions   Cinnamon Other (See Comments)    Tongue swells up. With artificial (such as in gum/red hots)   Dm-Guaifenesin Er    Influenza Vaccines Hives    Hives two years in a row.     Review of Systems: Review of Systems  Constitutional:  Negative for chills and fever.  HENT:  Negative for hearing loss.   Respiratory:  Negative for shortness of breath.   Cardiovascular:  Negative for chest pain.  Gastrointestinal:  Positive for abdominal pain and constipation. Negative for nausea and vomiting.  Genitourinary:  Negative for dysuria.  Musculoskeletal:  Negative for myalgias.  Skin:  Negative for rash.  Neurological:  Negative for dizziness.  Psychiatric/Behavioral:  Negative for depression.    Physical Exam BP 122/87   Pulse 99   Temp 99.6 F (37.6 C) (Oral)   Ht 5\' 3"  (1.6 m)   Wt (!) 303 lb 9.6  oz (137.7 kg)   SpO2 95%   BMI 53.78 kg/m  CONSTITUTIONAL: No acute distress HEENT:  Normocephalic, atraumatic, extraocular motion intact. NECK:  Trachea is midline, no jugular venous distention. RESPIRATORY:  Lungs are clear, and breath sounds are equal bilaterally. Normal respiratory effort without pathologic use of accessory muscles. CARDIOVASCULAR: Heart is regular without murmurs, gallops, or rubs. GI: The abdomen is soft, obese, non-distended, with some discomfort to palpation in the mid abdomen at the site of her hernias.  The main hernia which contained small bowel loops feels  soft, without any hard tissue or overlying skin changes, and without much discomfort with palpation.  She has two other smaller hernias which are fat containing, and these are somewhat more uncomfortable, but still not to the degree of the ED visit.  MUSCULOSKELETAL:  Normal gait, no peripheral edema NEUROLOGIC:  Motor and sensation is grossly normal.  Cranial nerves are grossly intact. PSYCH:  Alert and oriented to person, place and time. Affect is normal.  Labs/Imaging: Labs from 10/04/21: Na 135, K 3.5, Cl 105, CO2 22, BUN 15, Cr 0.55.  LFTs within normal limits.  WBC 11.1, Hgb 13.5, Hct 42, Plt 340  CT abdomen/pelvis on 10/04/21: IMPRESSION: Infraumbilical ventral hernia containing small bowel loops causing small bowel obstruction.  Assessment and Plan: This is a 35 y.o. female with an incisional ventral hernia with prior obstruction.  --Discussed with the patient again the findings on her imaging study and on exam.  Today, her main incisional hernia is softer and remains reducible compared to how she presented on 11/17.  There is still discomfort with the smaller fat-containing hernias, and these may be more incarcerated.   --Discussed with her the plan for robotic incisional hernia repair.  Reviewed the surgery at length with her, including risks of bleeding, infection, injury to surrounding structures, post-op activity restrictions, possibility for overnight hospital stay, and she's willing to proceed.  As a precaution, will have her booked as overnight stay as her surgery will be late in the afternoon and may be more extensive due to the size of her hernia and possible incarceration.  Will also send for medical clearance. --Will tentatively schedule the patient for 10/23/21.  I spent 40 minutes dedicated to the care of this patient on the date of this encounter to include pre-visit review of records, face-to-face time with the patient discussing diagnosis and management, and any post-visit  coordination of care.   Howie Ill, MD [facility]

## 2021-10-08 NOTE — Progress Notes (Signed)
Request for Medical Clearance has been faxed to Danelle Berry, PA-C.

## 2021-10-09 ENCOUNTER — Telehealth: Payer: Self-pay | Admitting: Surgery

## 2021-10-09 ENCOUNTER — Telehealth: Payer: Self-pay

## 2021-10-09 NOTE — Telephone Encounter (Signed)
Patient has been advised of Pre-Admission date/time, COVID Testing date and Surgery date.  Surgery Date: 10/23/21 Preadmission Testing Date: 10/18/21 (phone 1p-5p) Covid Testing Date: 10/19/21 @ 9:05 am at the [facility].     Patient has been made aware to call [phone], between 1-3:00pm the day before surgery, to find out what time to arrive for surgery.

## 2021-10-09 NOTE — Telephone Encounter (Signed)
Lvm that the patient needs to schedule a Surgical Clearance Appt. Per provider

## 2021-10-09 NOTE — Telephone Encounter (Signed)
Copied from CRM [id number]. Topic: General - Inquiry >> Oct 09, 2021  1:08 PM Aretta Nip wrote: Pls fu with pt upon return for lunch. Pt has a medical clearance appt for tomorrow with Sheliah Mends and is not feeling well, she states she is feeling better but does not want office to be exposed. She has surgery on 12/6 and is having to wear a binder to try to hold her in place till surgery. She needs to keep her surgury appt before situation becomes urgent. She was in ER and they have a lot of lab work and urine etc. She is request a virtual type visit  for clearance tomorrow and the appt to not be delayed. Pls fu with her at [phone] as appt is tomorrow at 2:00.

## 2021-10-10 ENCOUNTER — Ambulatory Visit
Admission: RE | Admit: 2021-10-10 | Discharge: 2021-10-10 | Disposition: A | Discharge status: Home / Self Care | Payer: 59 | Source: Ambulatory Visit | Attending: Family Medicine | Admitting: Family Medicine

## 2021-10-10 ENCOUNTER — Ambulatory Visit
Admission: RE | Admit: 2021-10-10 | Discharge: 2021-10-10 | Disposition: A | Discharge status: Home / Self Care | Payer: 59 | Attending: Family Medicine | Admitting: Family Medicine

## 2021-10-10 ENCOUNTER — Encounter: Payer: Self-pay | Admitting: Family Medicine

## 2021-10-10 ENCOUNTER — Ambulatory Visit (INDEPENDENT_AMBULATORY_CARE_PROVIDER_SITE_OTHER): Payer: 59 | Admitting: Family Medicine

## 2021-10-10 ENCOUNTER — Telehealth: Payer: Self-pay | Admitting: Surgery

## 2021-10-10 ENCOUNTER — Other Ambulatory Visit: Payer: Self-pay

## 2021-10-10 VITALS — BP 112/68 | HR 93 | Temp 98.3°F | Resp 16 | Ht 63.0 in | Wt 301.6 lb

## 2021-10-10 DIAGNOSIS — R7303 Prediabetes: Secondary | ICD-10-CM

## 2021-10-10 DIAGNOSIS — H6691 Otitis media, unspecified, right ear: Secondary | ICD-10-CM

## 2021-10-10 DIAGNOSIS — R051 Acute cough: Secondary | ICD-10-CM

## 2021-10-10 DIAGNOSIS — R319 Hematuria, unspecified: Secondary | ICD-10-CM

## 2021-10-10 DIAGNOSIS — Z01818 Encounter for other preprocedural examination: Secondary | ICD-10-CM | POA: Insufficient documentation

## 2021-10-10 DIAGNOSIS — H7292 Unspecified perforation of tympanic membrane, left ear: Secondary | ICD-10-CM | POA: Diagnosis not present

## 2021-10-10 DIAGNOSIS — Z6841 Body Mass Index (BMI) 40.0 and over, adult: Secondary | ICD-10-CM | POA: Diagnosis not present

## 2021-10-10 IMAGING — CR DG CHEST 2V
1 series · 2 of 2 positions shown · non-contrast
Comparison: [DATE]

CLINICAL DATA: Cough and upper respiratory symptoms, preoperative
clearance.

EXAM:
CHEST - 2 VIEW

[Series 1: dg chest 2 view · 0.14mm/px · 2 of 2 slices shown]
[im 1/2]
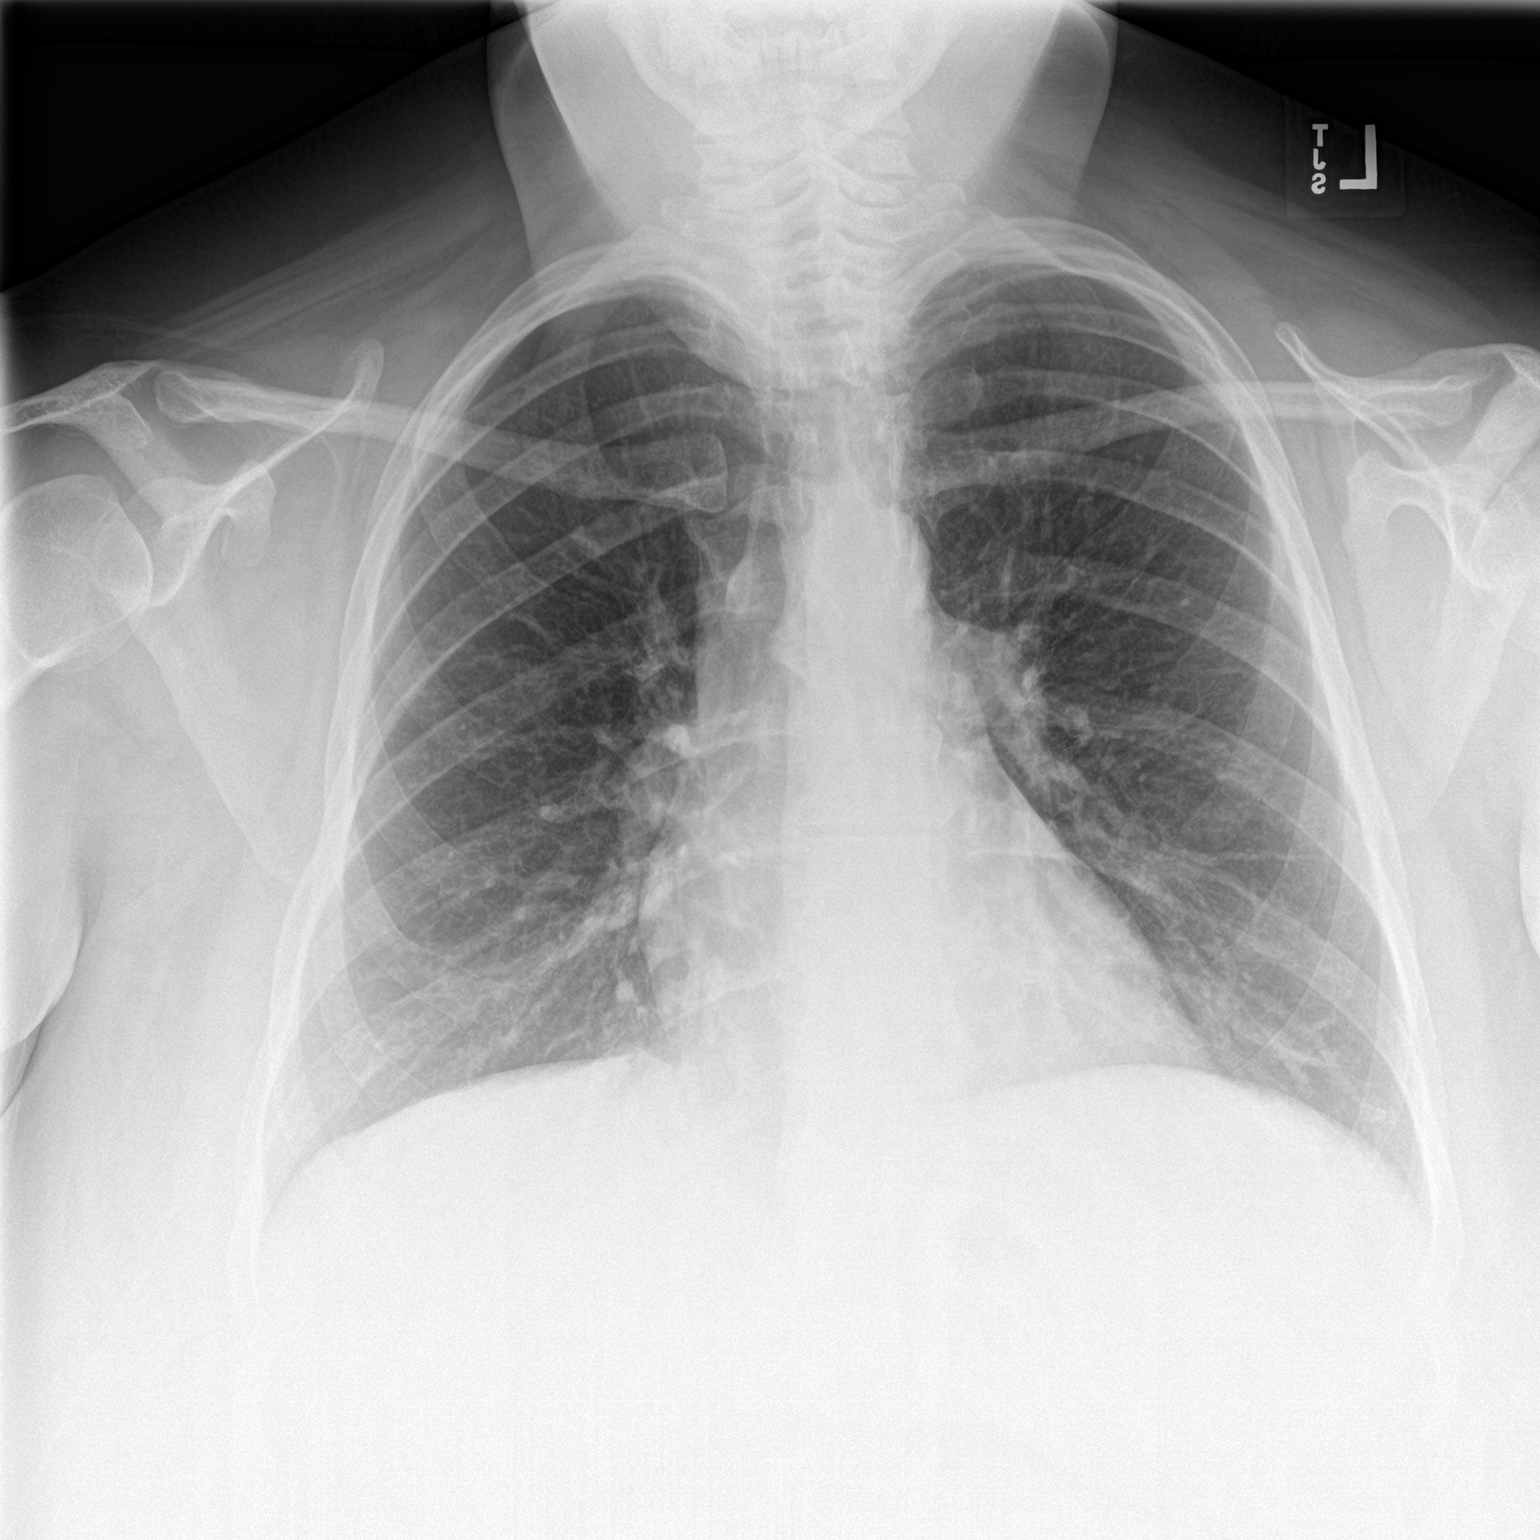
[im 2/2]
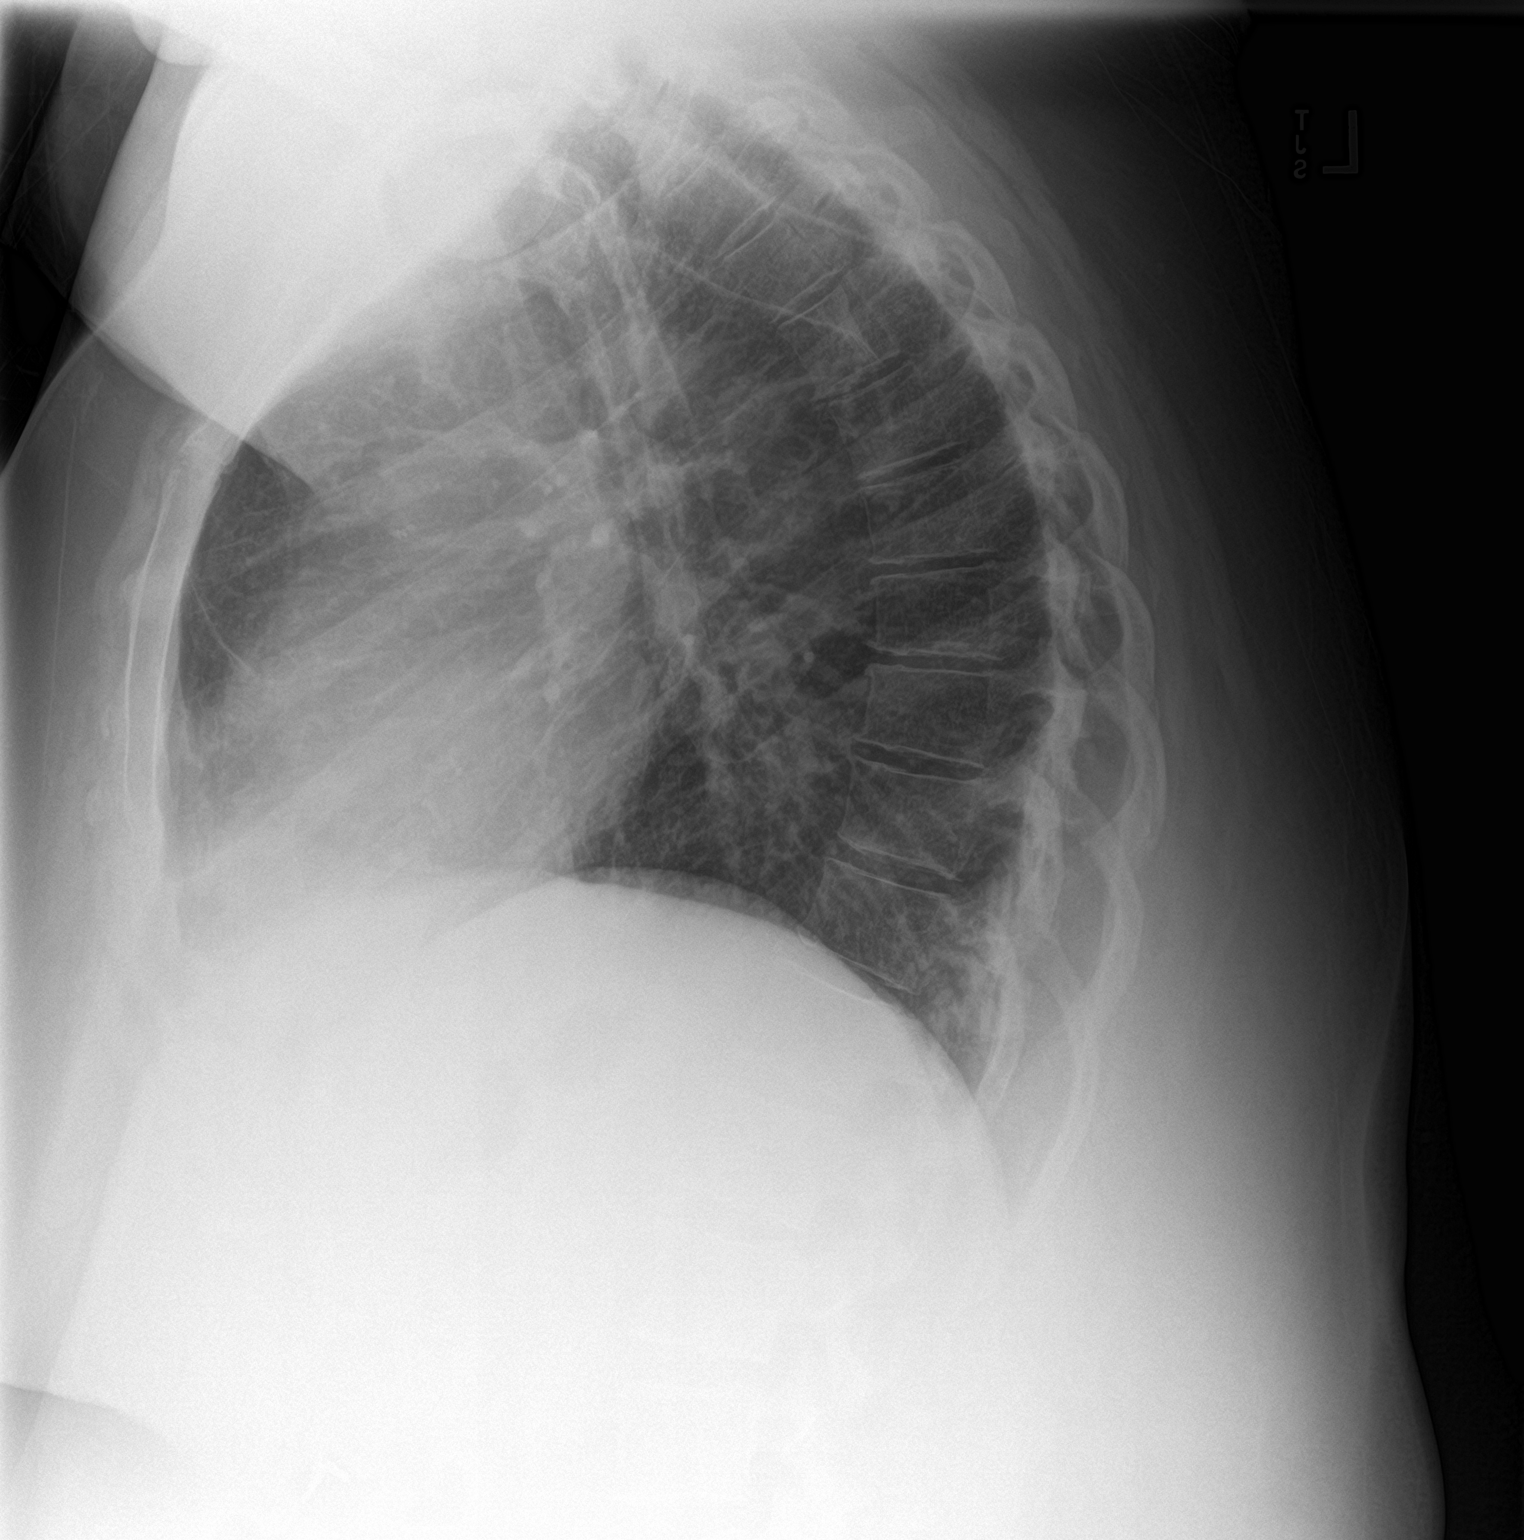

[2 of 2 positions shown; findings below may reference images not displayed]

FINDINGS: The heart size and mediastinal contours are within normal limits.
Both lungs are clear. The visualized skeletal structures are
unremarkable.
IMPRESSION: No active cardiopulmonary disease.

## 2021-10-10 MED ORDER — AMOXICILLIN-POT CLAVULANATE 875-125 MG PO TABS
1.0000 | ORAL_TABLET | Freq: Two times a day (BID) | ORAL | 0 refills | Status: DC
Start: 2021-10-10 — End: 2021-10-25

## 2021-10-10 MED ORDER — OFLOXACIN 0.3 % OP SOLN: 1.0000 [drp] | Freq: Four times a day (QID) | OPHTHALMIC | 0 refills | Status: AC

## 2021-10-10 NOTE — Telephone Encounter (Signed)
10/10/21  Called patient this afternoon.  There was a cancellation for surgery on 10/17/21, and I contacted the patient to see if she wanted to move her surgery to that date instead of her initially planned date of 10/23/21.  She was happy to change dates.  I have also contacted preop to change her preop testing and covid testing dates to reflect the new surgery date.  Surgery Date:  10/17/21, 1 pm.  Preadmission testing Date:  10/15/21, 10:45 am (phone call between 8 am - 1 pm)  COVID test:  10/15/21, 8 am - 10 am (in person at [facility])  Patient was given all this information by phone.  All questions answered.   Henrene Dodge, MD

## 2021-10-10 NOTE — Progress Notes (Addendum)
Name: Tanya Reeves   MRN: [id number]    DOB: 10-08-1986   Date:10/10/2021       Progress Note  Chief Complaint  Patient presents with   Pre surgical clearance     Subjective:   Tanya Reeves is a 35 y.o. female, presents to clinic for surgical clearance  Incisional hernia/hernia repair, recent bowel obstruction and ER visit, outpt surgery with [facility] to operate here for preop clearance Hx of bariatric surgery in 2020 ER visit 11/17, surgical f/up, doing surgery in 7 d  Coughing, ear pain, ear drainage after ER visit from 11/17 Complicated ENT hx - seen by dr. Jenne Campus  She endorses decreased N/V, she is passing gas and stool, able to drink, appetite still suppressed, Wearing abd binder, no increased abd pain or bloating No hx of difficult intubation or anesthesia complications Hx of cardiac eval about 5 years ago that was negative  Recent labs from ER visit reviewed, ua reviewed, last A1C about 8 months ago, no dm  Morbid obesity, she denies any hx of difficult intubation, anesthesia complications She had a cardiac work up a few years ago which she states was negative    Current Outpatient Medications:    albuterol (PROAIR HFA) 108 (90 Base) MCG/ACT inhaler, Inhale 2 puffs into the lungs every 4 (four) hours as needed., Disp: 1 each, Rfl: 2   eletriptan (RELPAX) 20 MG tablet, TAKE 1 TABLET BY MOUTH AS NEEDED FOR MIGRAINE OR HEADACHE. 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   Asthma 07/18/2017   Hidradenitis suppurativa 03/24/2017   Class 3 severe obesity with body mass index (BMI) of 50.0 to 59.9 in adult (HCC) 03/17/2016   Migraine without aura 02/26/2016   OSA on CPAP 02/26/2016   Left ventricular hypertrophy 08/04/2014   Status post bariatric surgery 07/07/2014    Past Surgical History:  Procedure Laterality Date   CESAREAN SECTION     2   CHOLECYSTECTOMY     COLON SURGERY     part of bowel removed due to c-section   sips     STOMACH SURGERY      Family History  Problem Relation Age of Onset   Diabetes Mother    Cancer Mother        femal organs   Hypoparathyroidism Mother    Asthma Mother    Diabetes Father    Juvenile idiopathic arthritis Daughter    Diabetes Maternal Grandmother    Diabetes Maternal Grandfather    Leukemia Paternal Grandmother    Aneurysm Paternal Grandfather     Social History   Tobacco Use   Smoking status: Never   Smokeless tobacco: Never  Vaping Use   Vaping Use: Never used  Substance Use Topics   Alcohol use: Yes    Alcohol/week: 0.0 standard drinks    Comment: wine - 2 drinks per month  Drug use: No     Allergies  Allergen Reactions   Cinnamon Other (See Comments)    Tongue swells up. With artificial (such as in gum/red hots)   Dm-Guaifenesin Er    Influenza Vaccines Hives    Hives two years in a row.     Health Maintenance  Topic Date Due   Hepatitis C Screening  Never done   Pneumococcal Vaccine 72-39 Years old (2 - PCV) 07/18/2018   FOOT EXAM  12/26/2019   PAP SMEAR-Modifier  07/13/2021   HEMOGLOBIN A1C  08/12/2021   OPHTHALMOLOGY EXAM  10/18/2021   URINE MICROALBUMIN  02/09/2022   TETANUS/TDAP  01/16/2025   HIV Screening  Completed   HPV VACCINES  Aged Out   INFLUENZA VACCINE  Discontinued   COVID-19 Vaccine  Discontinued    Chart Review Today: I personally reviewed  active problem list, medication list, allergies, family history, social history, health maintenance, notes from last encounter, lab results, imaging with the patient/caregiver today.   Review of Systems  Constitutional:  Positive for appetite change. Negative for chills, diaphoresis, fever and unexpected weight change.  HENT:  Positive for congestion, ear discharge, ear pain, postnasal drip and rhinorrhea. Negative for sore throat, trouble swallowing and voice change.   Eyes: Negative.   Respiratory:  Positive for cough. Negative for apnea, chest tightness, shortness of breath and wheezing.   Cardiovascular: Negative.  Negative for chest pain, palpitations and leg swelling.  Gastrointestinal:  Positive for abdominal pain and nausea. Negative for abdominal distention, anal bleeding, blood in stool, constipation, diarrhea, rectal pain and vomiting.  Endocrine: Negative.   Genitourinary: Negative.   Musculoskeletal: Negative.   Skin: Negative.   Allergic/Immunologic: Negative.   Neurological: Negative.   Hematological: Negative.   Psychiatric/Behavioral: Negative.    All other systems reviewed and are negative.   Objective:   Vitals:   10/10/21 1400  BP: 112/68  Pulse: 93  Resp: 16  Temp: 98.3 F (36.8 C)  TempSrc: Oral  SpO2: 98%  Weight: (!) 301 lb 9.6 oz (136.8 kg)  Height: 5\' 3"  (1.6 m)    Body mass index is 53.43 kg/m.  Physical Exam Vitals and nursing note reviewed.  Constitutional:      Appearance: Normal appearance. She is well-developed and well-groomed. She is morbidly obese. She is not ill-appearing, toxic-appearing or diaphoretic.     Comments: Appears uncomfortable  HENT:     Head: Normocephalic.     Right Ear: Ear canal and external ear normal. No swelling or tenderness. No mastoid tenderness. Tympanic membrane is injected, scarred, erythematous and bulging.     Left Ear: External ear normal. Drainage present. No swelling or tenderness. No mastoid tenderness.  Tympanic membrane is scarred and perforated.     Nose: Mucosal edema, congestion and rhinorrhea present.     Mouth/Throat:     Mouth: Mucous membranes are dry.     Pharynx: Oropharynx is clear. Uvula midline.  Eyes:     General:        Right eye: No discharge.        Left eye: No discharge.     Conjunctiva/sclera: Conjunctivae normal.     Pupils: Pupils are equal, round, and reactive to light.  Cardiovascular:     Rate and Rhythm: Normal rate and regular rhythm.     Pulses: Normal pulses.     Heart sounds: Normal heart sounds.  Pulmonary:     Effort: Pulmonary effort is normal. No tachypnea, accessory muscle usage,  respiratory distress or retractions.     Breath sounds: No stridor. Examination of the right-lower field reveals decreased breath sounds. Examination of the left-lower field reveals decreased breath sounds. Decreased breath sounds present. No wheezing or rhonchi.     Comments: Inspiratory effort limited by abd binder and pain Abdominal:     Comments: Abd binder on, protuberant obese abd and large panus  Skin:    General: Skin is warm.  Neurological:     Mental Status: She is alert. Mental status is at baseline.  Psychiatric:        Mood and Affect: Mood normal.        Behavior: Behavior normal. Behavior is cooperative.     ECG interpretation   Date: 10/15/21  Rate: 81  Rhythm: sinus rhythm  QRS Axis: left axis  Intervals: normal  ST/T Wave abnormalities: normal  Conduction Disutrbances: LAFB  Narrative Interpretation: sinus thythm  Old EKG Reviewed: 10/22/2018 ECG with cardiology - reviewed and no significant changes noted     Assessment & Plan:     ICD-10-CM   1. Pre-op testing  Z01.818 EKG 12-Lead    Hemoglobin A1c    DG Chest 2 View   recent labs from hospital reviewed, EKG NSR, CXR and few labs pending    2. BMI 50.0-59.9, adult (HCC)  Z68.43     3. Right otitis media, unspecified otitis media type  H66.91 amoxicillin-clavulanate (AUGMENTIN)  875-125 MG tablet   recurrent, abx, f/up ENT    4. Ruptured tympanic membrane, left  H72.92 ofloxacin (OCUFLOX) 0.3 % ophthalmic solution   ofloxicin drops/ciprodex?  oral abx for right ear, f/up with Dr. Jenne Campus, clear discharge, TM appears perforated with erythematous tissue? behind?    5. Hematuria, unspecified type  R31.9 Urinalysis, Routine w reflex microscopic    MICROSCOPIC MESSAGE   recheck urine -    6. Acute cough  R05.1 DG Chest 2 View   URI sx x 2 weeks with AOM and mild cough, screening CXR    7. Morbid obesity (HCC)  E66.01     8. Prediabetes  R73.03 Hemoglobin A1c     Preop paperwork will be completed with Xray and labs result   Danelle Berry, PA-C 10/10/21 2:38 PM  10/15/21 12:50 PM UA suggestive of possible UTI - moderate bacteria - no culture done - unsure if above abx was adequate coverage - pt encouraged to f/up is symptomatic CXR neg ECG reviewed A1C: Lab Results  Component Value Date   HGBA1C 5.9 (H) 10/10/2021   All labs adequate for surgery - preop clearance paper to be faxed over today.  Danelle Berry, PA-C

## 2021-10-10 NOTE — Telephone Encounter (Signed)
Tried to reach out to pt to get her rescheduled until she is feeling well, no answer left vm to call back. I also called pt husband but unfortunately he is at work.

## 2021-10-10 NOTE — Telephone Encounter (Signed)
Spoke to pt, pt denies fever, chills or any flu/cold symptoms. Pt states she was not feeling well due to her hernia but she will make it to the appointment for today.

## 2021-10-11 LAB — URINALYSIS, ROUTINE W REFLEX MICROSCOPIC
Bilirubin Urine: NEGATIVE
Glucose, UA: NEGATIVE
Leukocytes,Ua: NEGATIVE
Nitrite: NEGATIVE
Specific Gravity, Urine: 1.023 (ref 1.001–1.035)
pH: 6 (ref 5.0–8.0)

## 2021-10-11 LAB — HEMOGLOBIN A1C
Hgb A1c MFr Bld: 5.9 % of total Hgb — ABNORMAL HIGH (ref <5.7)
Mean Plasma Glucose: 123 mg/dL
eAG (mmol/L): 6.8 mmol/L

## 2021-10-11 LAB — MICROSCOPIC MESSAGE

## 2021-10-15 ENCOUNTER — Other Ambulatory Visit: Payer: Self-pay

## 2021-10-15 ENCOUNTER — Telehealth: Payer: Self-pay

## 2021-10-15 ENCOUNTER — Encounter
Admission: RE | Admit: 2021-10-15 | Discharge: 2021-10-15 | Disposition: A | Discharge status: Home / Self Care | Payer: 59 | Source: Ambulatory Visit | Attending: Surgery | Admitting: Surgery

## 2021-10-15 ENCOUNTER — Encounter: Payer: Self-pay | Admitting: Family Medicine

## 2021-10-15 ENCOUNTER — Other Ambulatory Visit
Admission: RE | Admit: 2021-10-15 | Discharge: 2021-10-15 | Disposition: A | Discharge status: Home / Self Care | Payer: 59 | Source: Ambulatory Visit | Attending: Surgery | Admitting: Surgery

## 2021-10-15 ENCOUNTER — Telehealth: Payer: Self-pay | Admitting: Family Medicine

## 2021-10-15 ENCOUNTER — Inpatient Hospital Stay: Admission: RE | Admit: 2021-10-15 | Payer: 59 | Source: Ambulatory Visit

## 2021-10-15 DIAGNOSIS — Z01812 Encounter for preprocedural laboratory examination: Secondary | ICD-10-CM | POA: Insufficient documentation

## 2021-10-15 DIAGNOSIS — Z1152 Encounter for screening for COVID-19: Secondary | ICD-10-CM

## 2021-10-15 DIAGNOSIS — Z20822 Contact with and (suspected) exposure to covid-19: Secondary | ICD-10-CM | POA: Insufficient documentation

## 2021-10-15 NOTE — Telephone Encounter (Signed)
Return Katie call and requested to fax over paperwork. Just faxed them over

## 2021-10-15 NOTE — Patient Instructions (Addendum)
Your procedure is scheduled on: 10/17/2021 Report to the Registration Desk on the 1st floor of the [facility].  To find out your arrival time, please call [phone] between 1PM - 3PM on: 10/16/2021  REMEMBER: Instructions that are not followed completely may result in serious medical risk, up to and including death; or upon the discretion of your surgeon and anesthesiologist your surgery may need to be rescheduled.  Do not eat food after midnight the night before surgery.  No gum chewing, lozengers or hard candies.  You may however, drink CLEAR liquids up to 2 hours before you are scheduled to arrive for your surgery. Do not drink anything within 2 hours of your scheduled arrival time.  Clear liquids include: - water  - apple juice without pulp - gatorade (not RED, PURPLE, OR BLUE) - black coffee or tea (Do NOT add milk or creamers to the coffee or tea) Do NOT drink anything that is not on this list.    TAKE THESE MEDICATIONS THE MORNING OF SURGERY WITH A SIP OF WATER: Augmentin  hydrocodone    Use inhalers on the day of surgery and bring to the hospital.   One week prior to surgery: Stop Anti-inflammatories (NSAIDS) such as Advil, Aleve, Ibuprofen, Motrin, Naproxen, Naprosyn and Aspirin based products such as Excedrin, Goodys Powder, BC Powder. Stop ANY OVER THE COUNTER supplements until after surgery. Like multivitamin  You may however, continue to take Tylenol if needed for pain up until the day of surgery.  No Alcohol for 24 hours before or after surgery.  No Smoking including e-cigarettes for 24 hours prior to surgery.  No chewable tobacco products for at least 6 hours prior to surgery.  No nicotine patches on the day of surgery.  Do not use any "recreational" drugs for at least a week prior to your surgery.  Please be advised that the combination of cocaine and anesthesia may have negative outcomes, up to and including death. If you test positive for cocaine,  your surgery will be cancelled.  On the morning of surgery brush your teeth with toothpaste and water, you may rinse your mouth with mouthwash if you wish. Do not swallow any toothpaste or mouthwash.  Bring your CPAP with you to the hospital and leave in the car in case you stay overnight.  Use CHG Soap or wipes as directed on instruction sheet.  Do not wear jewelry, make-up, hairpins, clips or nail polish.  Do not wear lotions, powders, or perfumes.   Do not shave body from the neck down 48 hours prior to surgery just in case you cut yourself which could leave a site for infection.  Also, freshly shaved skin may become irritated if using the CHG soap.  Contact lenses, hearing aids and dentures may not be worn into surgery.  Do not bring valuables to the hospital. [facility] is not responsible for any missing/lost belongings or valuables.    Notify your doctor if there is any change in your medical condition (cold, fever, infection).  Wear comfortable clothing (specific to your surgery type) to the hospital.  After surgery, you can help prevent lung complications by doing breathing exercises.  Take deep breaths and cough every 1-2 hours. Your doctor may order a device called an Incentive Spirometer to help you take deep breaths.  When coughing or sneezing, hold a pillow firmly against your incision with both hands. This is called "splinting." Doing this helps protect your incision. It also decreases belly discomfort.  If you are being admitted to the hospital overnight, leave your suitcase in the car. After surgery it may be brought to your room.  If you are being discharged the day of surgery, you will not be allowed to drive home. You will need a responsible adult (18 years or older) to drive you home and stay with you that night.   If you are taking public transportation, you will need to have a responsible adult (18 years or older) with you. Please confirm with your  physician that it is acceptable to use public transportation.   Please call the Pre-admissions Testing Dept. at [phone] if you have any questions about these instructions.  Surgery Visitation Policy:  Patients undergoing a surgery or procedure may have one family member or support person with them as long as that person is not COVID-19 positive or experiencing its symptoms.  That person may remain in the waiting area during the procedure and may rotate out with other people.  Inpatient Visitation:    Visiting hours are 7 a.m. to 8 p.m. Up to two visitors ages 16+ are allowed at one time in a patient room. The visitors may rotate out with other people during the day. Visitors must check out when they leave, or other visitors will not be allowed. One designated support person may remain overnight. The visitor must pass COVID-19 screenings, use hand sanitizer when entering and exiting the patient's room and wear a mask at all times, including in the patient's room. Patients must also wear a mask when staff or their visitor are in the room. Masking is required regardless of vaccination status.

## 2021-10-15 NOTE — Telephone Encounter (Signed)
Letter with lab results was printed out - can be put with completed preop paperwork and ECG and be faxed to them today - I'll put everything in your box

## 2021-10-15 NOTE — Telephone Encounter (Signed)
Katie calling from [facility] Preadmitting Medical Testing is calling to request of release EKG Please Advise Cb- [phone]

## 2021-10-15 NOTE — Telephone Encounter (Signed)
Preop paperwork has been faxed.

## 2021-10-15 NOTE — Telephone Encounter (Signed)
Copied from CRM [id number]. Topic: General - Other >> Oct 15, 2021 10:13 AM Fanny Bien wrote: Reason for CRM: [facility] called and stated that they have move surgery to 11/30 and will need medical clearance faxed to [phone]. Please advise

## 2021-10-16 ENCOUNTER — Telehealth: Payer: Self-pay

## 2021-10-16 LAB — SARS CORONAVIRUS 2 (TAT 6-24 HRS): SARS Coronavirus 2: NEGATIVE

## 2021-10-16 NOTE — Telephone Encounter (Signed)
Medical Clearance received from Henry Russel PA-C -patient optimized for surgery with low risk-AlC-5.9.

## 2021-10-17 ENCOUNTER — Observation Stay
Admission: RE | Admit: 2021-10-17 | Discharge: 2021-10-19 | Disposition: A | Discharge status: Home / Self Care | Payer: 59 | Attending: Surgery | Admitting: Surgery

## 2021-10-17 ENCOUNTER — Ambulatory Visit: Payer: 59 | Admitting: Anesthesiology

## 2021-10-17 ENCOUNTER — Other Ambulatory Visit: Payer: Self-pay

## 2021-10-17 ENCOUNTER — Encounter
Admission: RE | Disposition: A | Discharge status: Home / Self Care | Payer: Self-pay | Source: Home / Self Care | Attending: Surgery

## 2021-10-17 ENCOUNTER — Encounter: Payer: Self-pay | Admitting: Surgery

## 2021-10-17 DIAGNOSIS — J45909 Unspecified asthma, uncomplicated: Secondary | ICD-10-CM | POA: Diagnosis not present

## 2021-10-17 DIAGNOSIS — I10 Essential (primary) hypertension: Secondary | ICD-10-CM | POA: Insufficient documentation

## 2021-10-17 DIAGNOSIS — K432 Incisional hernia without obstruction or gangrene: Secondary | ICD-10-CM | POA: Diagnosis present

## 2021-10-17 DIAGNOSIS — K430 Incisional hernia with obstruction, without gangrene: Principal | ICD-10-CM | POA: Insufficient documentation

## 2021-10-17 DIAGNOSIS — E119 Type 2 diabetes mellitus without complications: Secondary | ICD-10-CM | POA: Diagnosis not present

## 2021-10-17 HISTORY — PX: INSERTION OF MESH: SHX5868

## 2021-10-17 HISTORY — PX: XI ROBOTIC ASSISTED VENTRAL HERNIA: SHX6789

## 2021-10-17 LAB — GLUCOSE, CAPILLARY: Glucose-Capillary: 184 mg/dL — ABNORMAL HIGH (ref 70–99)

## 2021-10-17 LAB — POCT PREGNANCY, URINE: Preg Test, Ur: NEGATIVE

## 2021-10-17 SURGERY — REPAIR, HERNIA, VENTRAL, ROBOT-ASSISTED
Anesthesia: General

## 2021-10-17 MED ORDER — FENTANYL CITRATE (PF) 100 MCG/2ML IJ SOLN
INTRAMUSCULAR | Status: AC
  Administered 2021-10-17: 50 ug
  Filled 2021-10-17: qty 2

## 2021-10-17 MED ORDER — BUPIVACAINE LIPOSOME 1.3 % IJ SUSP: 20.0000 mL | Freq: Once | INTRAMUSCULAR | Status: DC

## 2021-10-17 MED ORDER — MEPERIDINE HCL 25 MG/ML IJ SOLN: 6.2500 mg | INTRAMUSCULAR | Status: DC | PRN

## 2021-10-17 MED ORDER — HYDROMORPHONE HCL 1 MG/ML IJ SOLN
0.5000 mg | INTRAMUSCULAR | Status: DC | PRN
  Administered 2021-10-18: 0.5 mg via INTRAVENOUS
  Filled 2021-10-17: qty 0.5

## 2021-10-17 MED ORDER — GABAPENTIN 300 MG PO CAPS: 300.0000 mg | ORAL_CAPSULE | ORAL | Status: AC

## 2021-10-17 MED ORDER — LACTATED RINGERS IV SOLN: INTRAVENOUS | Status: DC

## 2021-10-17 MED ORDER — DEXMEDETOMIDINE (PRECEDEX) IN NS 20 MCG/5ML (4 MCG/ML) IV SYRINGE
PREFILLED_SYRINGE | INTRAVENOUS | Status: DC | PRN
  Administered 2021-10-17: 12 ug via INTRAVENOUS
  Administered 2021-10-17 (×2): 8 ug via INTRAVENOUS
  Administered 2021-10-17: 12 ug via INTRAVENOUS

## 2021-10-17 MED ORDER — LACTATED RINGERS IV SOLN: INTRAVENOUS | Status: DC | PRN

## 2021-10-17 MED ORDER — ROCURONIUM BROMIDE 100 MG/10ML IV SOLN
INTRAVENOUS | Status: DC | PRN
Start: 2021-10-17 — End: 2021-10-17
  Administered 2021-10-17 (×2): 20 mg via INTRAVENOUS
  Administered 2021-10-17: 60 mg via INTRAVENOUS
  Administered 2021-10-17: 30 mg via INTRAVENOUS
  Administered 2021-10-17: 60 mg via INTRAVENOUS
  Administered 2021-10-17: 10 mg via INTRAVENOUS
  Administered 2021-10-17: 30 mg via INTRAVENOUS

## 2021-10-17 MED ORDER — ONDANSETRON 4 MG PO TBDP
4.0000 mg | ORAL_TABLET | Freq: Four times a day (QID) | ORAL | Status: DC | PRN
  Administered 2021-10-19: 4 mg via ORAL
  Filled 2021-10-17: qty 1

## 2021-10-17 MED ORDER — HYDROMORPHONE HCL 1 MG/ML IJ SOLN
INTRAMUSCULAR | Status: AC
  Filled 2021-10-17: qty 1

## 2021-10-17 MED ORDER — MIDAZOLAM HCL 2 MG/2ML IJ SOLN
INTRAMUSCULAR | Status: DC | PRN
Start: 2021-10-17 — End: 2021-10-17
  Administered 2021-10-17: 2 mg via INTRAVENOUS

## 2021-10-17 MED ORDER — ACETAMINOPHEN 10 MG/ML IV SOLN: 1000.0000 mg | Freq: Once | INTRAVENOUS | Status: DC | PRN

## 2021-10-17 MED ORDER — ACETAMINOPHEN 500 MG PO TABS
ORAL_TABLET | ORAL | Status: AC
  Administered 2021-10-17: 1000 mg via ORAL
  Filled 2021-10-17: qty 2

## 2021-10-17 MED ORDER — GABAPENTIN 300 MG PO CAPS
ORAL_CAPSULE | ORAL | Status: AC
  Administered 2021-10-17: 300 mg via ORAL
  Filled 2021-10-17: qty 1

## 2021-10-17 MED ORDER — GLYCOPYRROLATE 0.2 MG/ML IJ SOLN
INTRAMUSCULAR | Status: DC | PRN
  Administered 2021-10-17: .2 mg via INTRAVENOUS

## 2021-10-17 MED ORDER — PANTOPRAZOLE SODIUM 40 MG IV SOLR
40.0000 mg | Freq: Every day | INTRAVENOUS | Status: DC
  Administered 2021-10-17 – 2021-10-18 (×2): 40 mg via INTRAVENOUS
  Filled 2021-10-17 (×2): qty 40

## 2021-10-17 MED ORDER — CEFAZOLIN IN SODIUM CHLORIDE 3-0.9 GM/100ML-% IV SOLN
3.0000 g | INTRAVENOUS | Status: AC
  Administered 2021-10-17 (×2): 3 g via INTRAVENOUS
  Filled 2021-10-17 (×2): qty 100

## 2021-10-17 MED ORDER — SUGAMMADEX SODIUM 500 MG/5ML IV SOLN
INTRAVENOUS | Status: DC | PRN
  Administered 2021-10-17: 300 mg via INTRAVENOUS

## 2021-10-17 MED ORDER — OXYCODONE HCL 5 MG PO TABS: 5.0000 mg | ORAL_TABLET | Freq: Once | ORAL | Status: DC | PRN

## 2021-10-17 MED ORDER — ONDANSETRON HCL 4 MG/2ML IJ SOLN
INTRAMUSCULAR | Status: DC | PRN
  Administered 2021-10-17 (×2): 4 mg via INTRAVENOUS

## 2021-10-17 MED ORDER — POLYETHYLENE GLYCOL 3350 17 G PO PACK: 17.0000 g | PACK | Freq: Every day | ORAL | Status: DC | PRN

## 2021-10-17 MED ORDER — ALBUTEROL SULFATE (2.5 MG/3ML) 0.083% IN NEBU: 2.5000 mL | INHALATION_SOLUTION | RESPIRATORY_TRACT | Status: DC | PRN

## 2021-10-17 MED ORDER — PROMETHAZINE HCL 25 MG/ML IJ SOLN
INTRAMUSCULAR | Status: AC
  Administered 2021-10-17: 12.5 mg via INTRAVENOUS
  Filled 2021-10-17: qty 1

## 2021-10-17 MED ORDER — ONDANSETRON HCL 4 MG/2ML IJ SOLN
4.0000 mg | Freq: Four times a day (QID) | INTRAMUSCULAR | Status: DC | PRN
  Administered 2021-10-17 – 2021-10-19 (×5): 4 mg via INTRAVENOUS
  Filled 2021-10-17 (×4): qty 2

## 2021-10-17 MED ORDER — FENTANYL CITRATE (PF) 100 MCG/2ML IJ SOLN
INTRAMUSCULAR | Status: AC
  Filled 2021-10-17: qty 2

## 2021-10-17 MED ORDER — HYDROMORPHONE HCL 1 MG/ML IJ SOLN
INTRAMUSCULAR | Status: DC | PRN
  Administered 2021-10-17: 1 mg via INTRAVENOUS

## 2021-10-17 MED ORDER — PROMETHAZINE HCL 25 MG/ML IJ SOLN
12.5000 mg | Freq: Once | INTRAMUSCULAR | Status: AC | PRN
Start: 2021-10-17 — End: 2021-10-17

## 2021-10-17 MED ORDER — ACETAMINOPHEN 10 MG/ML IV SOLN
INTRAVENOUS | Status: DC | PRN
  Administered 2021-10-17: 1000 mg via INTRAVENOUS

## 2021-10-17 MED ORDER — MIDAZOLAM HCL 2 MG/2ML IJ SOLN
INTRAMUSCULAR | Status: AC
  Filled 2021-10-17: qty 2

## 2021-10-17 MED ORDER — HYDROMORPHONE HCL 1 MG/ML IJ SOLN
0.2500 mg | INTRAMUSCULAR | Status: DC | PRN
  Administered 2021-10-17: 0.25 mg via INTRAVENOUS

## 2021-10-17 MED ORDER — ORAL CARE MOUTH RINSE: 15.0000 mL | Freq: Once | OROMUCOSAL | Status: AC

## 2021-10-17 MED ORDER — BUPIVACAINE-EPINEPHRINE (PF) 0.25% -1:200000 IJ SOLN
INTRAMUSCULAR | Status: AC
  Filled 2021-10-17: qty 30

## 2021-10-17 MED ORDER — CHLORHEXIDINE GLUCONATE 0.12 % MT SOLN
OROMUCOSAL | Status: AC
  Administered 2021-10-17: 15 mL via OROMUCOSAL
  Filled 2021-10-17: qty 15

## 2021-10-17 MED ORDER — OXYCODONE HCL 5 MG PO TABS
5.0000 mg | ORAL_TABLET | ORAL | Status: DC | PRN
  Administered 2021-10-17: 5 mg via ORAL
  Administered 2021-10-18 – 2021-10-19 (×6): 10 mg via ORAL
  Filled 2021-10-17 (×2): qty 2
  Filled 2021-10-17: qty 1
  Filled 2021-10-17: qty 2
  Filled 2021-10-17: qty 1
  Filled 2021-10-17 (×3): qty 2

## 2021-10-17 MED ORDER — OXYCODONE HCL 5 MG/5ML PO SOLN: 5.0000 mg | Freq: Once | ORAL | Status: DC | PRN

## 2021-10-17 MED ORDER — PROMETHAZINE HCL 25 MG/ML IJ SOLN: 12.5000 mg | Freq: Once | INTRAMUSCULAR | Status: DC | PRN

## 2021-10-17 MED ORDER — ACETAMINOPHEN 500 MG PO TABS: 1000.0000 mg | ORAL_TABLET | ORAL | Status: AC

## 2021-10-17 MED ORDER — ACETAMINOPHEN 500 MG PO TABS
1000.0000 mg | ORAL_TABLET | Freq: Four times a day (QID) | ORAL | Status: DC
  Administered 2021-10-18 – 2021-10-19 (×5): 1000 mg via ORAL
  Filled 2021-10-17 (×5): qty 2

## 2021-10-17 MED ORDER — PROPOFOL 10 MG/ML IV BOLUS
INTRAVENOUS | Status: AC
  Filled 2021-10-17: qty 20

## 2021-10-17 MED ORDER — ACETAMINOPHEN 325 MG PO TABS: 325.0000 mg | ORAL_TABLET | ORAL | Status: DC | PRN

## 2021-10-17 MED ORDER — KETOROLAC TROMETHAMINE 30 MG/ML IJ SOLN
30.0000 mg | Freq: Four times a day (QID) | INTRAMUSCULAR | Status: DC
  Administered 2021-10-17 – 2021-10-19 (×6): 30 mg via INTRAVENOUS
  Filled 2021-10-17 (×6): qty 1

## 2021-10-17 MED ORDER — DEXAMETHASONE SODIUM PHOSPHATE 10 MG/ML IJ SOLN
INTRAMUSCULAR | Status: DC | PRN
  Administered 2021-10-17: 10 mg via INTRAVENOUS

## 2021-10-17 MED ORDER — SODIUM CHLORIDE (PF) 0.9 % IJ SOLN
INTRAMUSCULAR | Status: DC | PRN
  Administered 2021-10-17: 80 mL

## 2021-10-17 MED ORDER — FAMOTIDINE 20 MG PO TABS
ORAL_TABLET | ORAL | Status: AC
  Administered 2021-10-17: 20 mg via ORAL
  Filled 2021-10-17: qty 1

## 2021-10-17 MED ORDER — FENTANYL CITRATE (PF) 100 MCG/2ML IJ SOLN: 50.0000 ug | Freq: Once | INTRAMUSCULAR | Status: DC

## 2021-10-17 MED ORDER — SEVOFLURANE IN SOLN
RESPIRATORY_TRACT | Status: AC
  Filled 2021-10-17: qty 250

## 2021-10-17 MED ORDER — FENTANYL CITRATE (PF) 250 MCG/5ML IJ SOLN
INTRAMUSCULAR | Status: AC
  Filled 2021-10-17: qty 5

## 2021-10-17 MED ORDER — CHLORHEXIDINE GLUCONATE 0.12 % MT SOLN: 15.0000 mL | Freq: Once | OROMUCOSAL | Status: AC

## 2021-10-17 MED ORDER — LIDOCAINE HCL (CARDIAC) PF 100 MG/5ML IV SOSY
PREFILLED_SYRINGE | INTRAVENOUS | Status: DC | PRN
  Administered 2021-10-17: 100 mg via INTRAVENOUS

## 2021-10-17 MED ORDER — EPHEDRINE SULFATE 50 MG/ML IJ SOLN
INTRAMUSCULAR | Status: DC | PRN
  Administered 2021-10-17: 5 mg via INTRAVENOUS

## 2021-10-17 MED ORDER — BUPIVACAINE LIPOSOME 1.3 % IJ SUSP
INTRAMUSCULAR | Status: AC
  Filled 2021-10-17: qty 20

## 2021-10-17 MED ORDER — ACETAMINOPHEN 160 MG/5ML PO SOLN
325.0000 mg | ORAL | Status: DC | PRN
  Filled 2021-10-17: qty 20.3

## 2021-10-17 MED ORDER — SODIUM CHLORIDE FLUSH 0.9 % IV SOLN
INTRAVENOUS | Status: AC
  Filled 2021-10-17: qty 30

## 2021-10-17 MED ORDER — CEFAZOLIN SODIUM 1 G IJ SOLR
INTRAMUSCULAR | Status: AC
  Filled 2021-10-17: qty 30

## 2021-10-17 MED ORDER — 0.9 % SODIUM CHLORIDE (POUR BTL) OPTIME
TOPICAL | Status: DC | PRN
  Administered 2021-10-17: 200 mL

## 2021-10-17 MED ORDER — CHLORHEXIDINE GLUCONATE CLOTH 2 % EX PADS: 6.0000 | MEDICATED_PAD | Freq: Once | CUTANEOUS | Status: DC

## 2021-10-17 MED ORDER — ENOXAPARIN SODIUM 40 MG/0.4ML IJ SOSY
40.0000 mg | PREFILLED_SYRINGE | INTRAMUSCULAR | Status: DC
  Administered 2021-10-18: 40 mg via SUBCUTANEOUS
  Filled 2021-10-17: qty 0.4

## 2021-10-17 MED ORDER — FAMOTIDINE 20 MG PO TABS: 20.0000 mg | ORAL_TABLET | Freq: Once | ORAL | Status: AC

## 2021-10-17 MED ORDER — FENTANYL CITRATE (PF) 100 MCG/2ML IJ SOLN
INTRAMUSCULAR | Status: DC | PRN
  Administered 2021-10-17: 50 ug via INTRAVENOUS
  Administered 2021-10-17: 100 ug via INTRAVENOUS
  Administered 2021-10-17: 25 ug via INTRAVENOUS
  Administered 2021-10-17: 50 ug via INTRAVENOUS
  Administered 2021-10-17: 100 ug via INTRAVENOUS
  Administered 2021-10-17: 25 ug via INTRAVENOUS

## 2021-10-17 MED ORDER — ROCURONIUM BROMIDE 10 MG/ML (PF) SYRINGE
PREFILLED_SYRINGE | INTRAVENOUS | Status: AC
  Filled 2021-10-17: qty 10

## 2021-10-17 MED ORDER — SUCCINYLCHOLINE CHLORIDE 200 MG/10ML IV SOSY
PREFILLED_SYRINGE | INTRAVENOUS | Status: DC | PRN
Start: 2021-10-17 — End: 2021-10-17
  Administered 2021-10-17: 120 mg via INTRAVENOUS

## 2021-10-17 MED ORDER — PHENYLEPHRINE HCL (PRESSORS) 10 MG/ML IV SOLN
INTRAVENOUS | Status: AC
  Filled 2021-10-17: qty 1

## 2021-10-17 MED ORDER — PROPOFOL 10 MG/ML IV BOLUS
INTRAVENOUS | Status: DC | PRN
  Administered 2021-10-17: 200 mg via INTRAVENOUS
  Administered 2021-10-17 (×2): 50 mg via INTRAVENOUS

## 2021-10-17 SURGICAL SUPPLY — 56 items
BINDER ABDOMINAL 12 ML 46-62 (SOFTGOODS) ×3 IMPLANT
BLADE SURG SZ11 CARB STEEL (BLADE) ×3 IMPLANT
CANNULA REDUC XI 12-8 STAPL (CANNULA) ×1
CANNULA REDUCER 12-8 DVNC XI (CANNULA) ×2 IMPLANT
COVER TIP SHEARS 8 DVNC (MISCELLANEOUS) ×2 IMPLANT
COVER TIP SHEARS 8MM DA VINCI (MISCELLANEOUS) ×1
COVER WAND RF STERILE (DRAPES) ×3 IMPLANT
DERMABOND ADVANCED (GAUZE/BANDAGES/DRESSINGS) ×1
DERMABOND ADVANCED .7 DNX12 (GAUZE/BANDAGES/DRESSINGS) ×2 IMPLANT
DEVICE SECURE STRAP 25 ABSORB (INSTRUMENTS) ×6 IMPLANT
DRAPE ARM DVNC X/XI (DISPOSABLE) ×6 IMPLANT
DRAPE COLUMN DVNC XI (DISPOSABLE) ×2 IMPLANT
DRAPE DA VINCI XI ARM (DISPOSABLE) ×3
DRAPE DA VINCI XI COLUMN (DISPOSABLE) ×1
ELECT CAUTERY BLADE TIP 2.5 (TIP) ×3
ELECT REM PT RETURN 9FT ADLT (ELECTROSURGICAL) ×3
ELECTRODE CAUTERY BLDE TIP 2.5 (TIP) ×2 IMPLANT
ELECTRODE REM PT RTRN 9FT ADLT (ELECTROSURGICAL) ×2 IMPLANT
GAUZE 4X4 16PLY ~~LOC~~+RFID DBL (SPONGE) ×3 IMPLANT
GLOVE SURG SYN 7.0 (GLOVE) ×9 IMPLANT
GLOVE SURG SYN 7.5  E (GLOVE) ×3
GLOVE SURG SYN 7.5 E (GLOVE) ×6 IMPLANT
GOWN STRL REUS W/ TWL LRG LVL3 (GOWN DISPOSABLE) ×6 IMPLANT
GOWN STRL REUS W/TWL LRG LVL3 (GOWN DISPOSABLE) ×3
GRASPER SUT TROCAR 14GX15 (MISCELLANEOUS) ×3 IMPLANT
KIT PINK PAD W/HEAD ARE REST (MISCELLANEOUS) ×3
KIT PINK PAD W/HEAD ARM REST (MISCELLANEOUS) ×2 IMPLANT
LABEL OR SOLS (LABEL) ×3 IMPLANT
MANIFOLD NEPTUNE II (INSTRUMENTS) ×3 IMPLANT
MESH VENTRALIGHT ST 6X8 (Mesh General) ×1 IMPLANT
MESH VENTRLGHT ELIP 8X6XMFL (Mesh General) ×2 IMPLANT
NEEDLE HYPO 22GX1.5 SAFETY (NEEDLE) ×3 IMPLANT
NEEDLE INSUFFLATION 14GA 120MM (NEEDLE) ×3 IMPLANT
NEEDLE SPNL 22GX3.5 QUINCKE BK (NEEDLE) ×3 IMPLANT
OBTURATOR OPTICAL LONG 8 DVNC (TROCAR) ×2 IMPLANT
OBTURATOR OPTICAL LONG 8MM (TROCAR) ×1
OBTURATOR OPTICAL STANDARD 8MM (TROCAR) ×1
OBTURATOR OPTICAL STND 8 DVNC (TROCAR) ×2
OBTURATOR OPTICALSTD 8 DVNC (TROCAR) ×2 IMPLANT
PACK LAP CHOLECYSTECTOMY (MISCELLANEOUS) ×3 IMPLANT
PENCIL ELECTRO HAND CTR (MISCELLANEOUS) ×3 IMPLANT
SEAL CANN UNIV 5-8 DVNC XI (MISCELLANEOUS) ×4 IMPLANT
SEAL XI 5MM-8MM UNIVERSAL (MISCELLANEOUS) ×2
SET TUBE SMOKE EVAC HIGH FLOW (TUBING) ×3 IMPLANT
SOLUTION ELECTROLUBE (MISCELLANEOUS) ×3 IMPLANT
SPONGE T-LAP 18X18 ~~LOC~~+RFID (SPONGE) ×3 IMPLANT
STAPLER CANNULA SEAL DVNC XI (STAPLE) ×2 IMPLANT
STAPLER CANNULA SEAL XI (STAPLE) ×1
SUT MNCRL 4-0 (SUTURE) ×1
SUT MNCRL 4-0 27XMFL (SUTURE) ×2
SUT STRATAFIX PDS 30 CT-1 (SUTURE) ×9 IMPLANT
SUT VICRYL 0 AB UR-6 (SUTURE) ×6 IMPLANT
SUT VLOC 90 2/L VL 12 GS22 (SUTURE) ×6 IMPLANT
SUTURE MNCRL 4-0 27XMF (SUTURE) ×2 IMPLANT
TRAY FOLEY SLVR 16FR LF STAT (SET/KITS/TRAYS/PACK) ×3 IMPLANT
TROCAR 5M 150ML BLDLS (TROCAR) ×3 IMPLANT

## 2021-10-17 NOTE — Transfer of Care (Signed)
Immediate Anesthesia Transfer of Care Note  Patient: Tanya Reeves  Procedure(s) Performed: XI ROBOTIC ASSISTED VENTRAL HERNIA, incisional INSERTION OF MESH  Patient Location: PACU  Anesthesia Type:General  Level of Consciousness: drowsy and patient cooperative  Airway & Oxygen Therapy: Patient Spontanous Breathing and Patient connected to face mask oxygen  Post-op Assessment: Report given to RN and Post -op Vital signs reviewed and stable  Post vital signs: Reviewed and stable  Last Vitals:  Vitals Value Taken Time  BP 125/59 10/17/21 2026  Temp    Pulse 97 10/17/21 2026  Resp 19 10/17/21 2026  SpO2 97 % 10/17/21 2026  Vitals shown include unvalidated device data.  Last Pain:  Vitals:   10/17/21 1148  TempSrc: Oral  PainSc: 3          Complications: No notable events documented.

## 2021-10-17 NOTE — Interval H&P Note (Signed)
History and Physical Interval Note:  10/17/2021 12:59 PM  Tanya Reeves  has presented today for surgery, with the diagnosis of Incisional hernia, incarcerated.  The various methods of treatment have been discussed with the patient and family. After consideration of risks, benefits and other options for treatment, the patient has consented to  Procedure(s) with comments: XI ROBOTIC ASSISTED VENTRAL HERNIA, incisional (N/A) - Incisional hernia as a surgical intervention.  The patient's history has been reviewed, patient examined, no change in status, stable for surgery.  I have reviewed the patient's chart and labs.  Questions were answered to the patient's satisfaction.     Tanya Reeves

## 2021-10-17 NOTE — Progress Notes (Signed)
Patient awake/alert x4. X5 abd sites all with dermabond. Abd binder in place. Patient c/o's left shoulder discomfort, improves with movement and repositioning.  Procedure done robotic assisted, reviewed procedure with patient, verbalizes understanding.  Sats >96% on 4 liters [place]. Patient does use cpap hs, machine at bedside.  Family at bedside while in pacu, patient's mother accompanied patient to room and will stay overnight.

## 2021-10-17 NOTE — Anesthesia Preprocedure Evaluation (Addendum)
Anesthesia Evaluation  Patient identified by MRN, date of birth, ID band Patient awake    Reviewed: Allergy & Precautions, NPO status , Patient's Chart, lab work & pertinent test results  History of Anesthesia Complications Negative for: history of anesthetic complications  Airway Mallampati: II  TM Distance: >3 FB Neck ROM: Full   Comment: Narrow palate, redundant submental tissue Dental   Pulmonary shortness of breath and with exertion, asthma , sleep apnea and Continuous Positive Airway Pressure Ventilation ,    Ear infection, sinus drainage, has 2 days of Augmentin left.  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  Definity not used due to pregnancy.  No prior study for comparison.    Reproductive/Obstetrics                            Anesthesia Physical Anesthesia Plan  ASA: 4  Anesthesia Plan: General   Post-op Pain Management:    Induction:   PONV Risk Score and Plan:   Airway Management Planned:   Additional Equipment:   Intra-op Plan:   Post-operative Plan:   Informed Consent: I have reviewed the patients History and  Physical, chart, labs and discussed the procedure including the risks, benefits and alternatives for the proposed anesthesia with the patient or authorized representative who has indicated his/her understanding and acceptance.       Plan Discussed with: CRNA  Anesthesia Plan Comments: (Ramp for intubation.  Head of bed elevated during induction and emergence per patient request.  Also requests to be told prior to induction of anesthesia so she's not surprised)        Anesthesia Quick Evaluation

## 2021-10-17 NOTE — Brief Op Note (Signed)
10/17/2021  8:23 PM  PATIENT:  Tanya Reeves  35 y.o. female  PRE-OPERATIVE DIAGNOSIS:  Incisional hernia, incarcerated  POST-OPERATIVE DIAGNOSIS:  Incisional hernia, incarcerated  PROCEDURE:  Procedure(s) with comments: XI ROBOTIC ASSISTED VENTRAL HERNIA, incisional (N/A) - Incisional hernia INSERTION OF MESH  SURGEON:  Surgeon(s) and Role:    * Deshan Hemmelgarn, MD - Primary  ANESTHESIA:   general  EBL:  30 mL   BLOOD ADMINISTERED:none  DRAINS: none   LOCAL MEDICATIONS USED:  BUPIVICAINE   SPECIMEN:  No Specimen  DISPOSITION OF SPECIMEN:  N/A  COUNTS:  YES  DICTATION: .Dragon Dictation  PLAN OF CARE: Admit for overnight observation  PATIENT DISPOSITION:  PACU - hemodynamically stable.   Delay start of Pharmacological VTE agent (>24hrs) due to surgical blood loss or risk of bleeding: yes

## 2021-10-17 NOTE — Anesthesia Procedure Notes (Signed)
Procedure Name: Intubation Date/Time: 10/17/2021 1:32 PM Performed by: Mohammed Kindle, CRNA Pre-anesthesia Checklist: Patient identified, Emergency Drugs available, Suction available and Patient being monitored Patient Re-evaluated:Patient Re-evaluated prior to induction Oxygen Delivery Method: Circle system utilized Preoxygenation: Pre-oxygenation with 100% oxygen Induction Type: IV induction Ventilation: Mask ventilation without difficulty Laryngoscope Size: McGraph and 3 Grade View: Grade I Tube type: Oral Tube size: 7.0 mm Number of attempts: 1 Airway Equipment and Method: Stylet and Oral airway Placement Confirmation: ETT inserted through vocal cords under direct vision, positive ETCO2, breath sounds checked- equal and bilateral and CO2 detector Secured at: 21 cm Tube secured with: Tape Dental Injury: Teeth and Oropharynx as per pre-operative assessment

## 2021-10-18 ENCOUNTER — Encounter: Payer: Self-pay | Admitting: Surgery

## 2021-10-18 ENCOUNTER — Other Ambulatory Visit: Admission: RE | Admit: 2021-10-18 | Payer: 59 | Source: Ambulatory Visit

## 2021-10-18 DIAGNOSIS — K430 Incisional hernia with obstruction, without gangrene: Secondary | ICD-10-CM | POA: Diagnosis not present

## 2021-10-18 LAB — CBC WITH DIFFERENTIAL/PLATELET
Abs Immature Granulocytes: 0.07 10*3/uL (ref 0.00–0.07)
Basophils Absolute: 0.1 10*3/uL (ref 0.0–0.1)
Basophils Relative: 0 %
Eosinophils Absolute: 0 10*3/uL (ref 0.0–0.5)
Eosinophils Relative: 0 %
HCT: 36.8 % (ref 36.0–46.0)
Hemoglobin: 11.6 g/dL — ABNORMAL LOW (ref 12.0–15.0)
Immature Granulocytes: 1 %
Lymphocytes Relative: 9 %
Lymphs Abs: 1 10*3/uL (ref 0.7–4.0)
MCH: 25.6 pg — ABNORMAL LOW (ref 26.0–34.0)
MCHC: 31.5 g/dL (ref 30.0–36.0)
MCV: 81.1 fL (ref 80.0–100.0)
Monocytes Absolute: 0.6 10*3/uL (ref 0.1–1.0)
Monocytes Relative: 5 %
Neutro Abs: 9.6 10*3/uL — ABNORMAL HIGH (ref 1.7–7.7)
Neutrophils Relative %: 85 %
Platelets: 316 10*3/uL (ref 150–400)
RBC: 4.54 MIL/uL (ref 3.87–5.11)
RDW: 14.6 % (ref 11.5–15.5)
WBC: 11.3 10*3/uL — ABNORMAL HIGH (ref 4.0–10.5)
nRBC: 0 % (ref 0.0–0.2)

## 2021-10-18 LAB — BASIC METABOLIC PANEL
Anion gap: 7 (ref 5–15)
BUN: 6 mg/dL (ref 6–20)
CO2: 25 mmol/L (ref 22–32)
Calcium: 8.8 mg/dL — ABNORMAL LOW (ref 8.9–10.3)
Chloride: 102 mmol/L (ref 98–111)
Creatinine, Ser: 0.52 mg/dL (ref 0.44–1.00)
GFR, Estimated: >60 mL/min (ref >60)
Glucose, Bld: 142 mg/dL — ABNORMAL HIGH (ref 70–99)
Potassium: 3.9 mmol/L (ref 3.5–5.1)
Sodium: 134 mmol/L — ABNORMAL LOW (ref 135–145)

## 2021-10-18 LAB — HIV ANTIBODY (ROUTINE TESTING W REFLEX): HIV Screen 4th Generation wRfx: NONREACTIVE

## 2021-10-18 LAB — MAGNESIUM: Magnesium: 2 mg/dL (ref 1.7–2.4)

## 2021-10-18 NOTE — Progress Notes (Signed)
Mobility Specialist - Progress Note   10/18/21 1600  Mobility  Activity Ambulated in hall  Level of Assistance Modified independent, requires aide device or extra time  Assistive Device Front wheel walker  Distance Ambulated (ft) 320 ft  Mobility Ambulated independently in hallway  Mobility Response Tolerated well  Mobility performed by Mobility specialist  $Mobility charge 1 Mobility    Pt lying in bed upon arrival, utilizing 2L. Pt politely declined mobility this date, reports she's been up and moving without issue. Pt was observed ambulating in hallway with family later this date with some pain and discomfort noted.    Filiberto Pinks Mobility Specialist 10/18/21, 4:20 PM

## 2021-10-18 NOTE — Anesthesia Postprocedure Evaluation (Signed)
Anesthesia Post Note  Patient: Tanya Reeves  Procedure(s) Performed: XI ROBOTIC ASSISTED VENTRAL HERNIA, incisional INSERTION OF MESH  Patient location during evaluation: PACU Anesthesia Type: General Level of consciousness: awake and alert Pain management: pain level controlled Vital Signs Assessment: post-procedure vital signs reviewed and stable Respiratory status: spontaneous breathing, nonlabored ventilation, respiratory function stable and patient connected to nasal cannula oxygen Cardiovascular status: blood pressure returned to baseline and stable Postop Assessment: no apparent nausea or vomiting Anesthetic complications: no   No notable events documented.   Last Vitals:  Vitals:   10/18/21 0409 10/18/21 0801  BP: 134/75 115/68  Pulse: 83 65  Resp: 18   Temp: 36.6 C (!) 36.3 C  SpO2: 100% 98%    Last Pain:  Vitals:   10/18/21 1206  TempSrc:   PainSc: 3                  Yevette Edwards

## 2021-10-18 NOTE — Progress Notes (Addendum)
[facility] SURGICAL PROGRESS NOTE  Hospital Day(s): 0.   Post op day(s): 1 Day Post-Op.   Interval History:  Patient seen and examined No acute events or new complaints overnight.  Patient reports she is doing okay as long as she stays on top of her pain. Had a severe episode of pain overnight and had some discomfort ambulating this afternoon No fever, chills, nausea, emesis She has a slight leukocytosis to 11.3K; likely reactive from OR Renal function is normal; sCr - 0.52 No significant electrolyte derangements She has tolerated advancement of diet today  Vital signs in last 24 hours: [min-max] current  Temp:  [97.4 F (36.3 C)-98.9 F (37.2 C)] 97.4 F (36.3 C) (12/01 0801) Pulse Rate:  [65-98] 65 (12/01 0801) Resp:  [14-22] 18 (12/01 0409) BP: (115-139)/(59-80) 115/68 (12/01 0801) SpO2:  [94 %-100 %] 98 % (12/01 0801)     Height: 5\' 3"  (160 cm) Weight: (!) 136.8 kg BMI (Calculated): 53.44   Intake/Output last 2 shifts:  11/30 0701 - 12/01 0700 In: 2956.8 [I.V.:2736.8; IV Piggyback:220] Out: 1330 [Urine:1300; Blood:30]   Physical Exam:  Constitutional: alert, cooperative and no distress  Respiratory: breathing non-labored at rest  Cardiovascular: regular rate and sinus rhythm  Gastrointestinal: soft, expected incisional soreness, and non-distended, no rebound/guarding. Abdominal binder present Integumentary: Laparoscopic incisions are CDI with dermabond, no erythema or drainage   Labs:  CBC Latest Ref Rng & Units 10/18/2021 10/04/2021 02/09/2021  WBC 4.0 - 10.5 K/uL 11.3(H) 11.1(H) 6.2  Hemoglobin 12.0 - 15.0 g/dL 11.6(L) 13.5 11.7  Hematocrit 36.0 - 46.0 % 36.8 42.0 36.6  Platelets 150 - 400 K/uL 316 340 320   CMP Latest Ref Rng & Units 10/18/2021 10/04/2021 02/09/2021  Glucose 70 - 99 mg/dL 02/11/2021) 786(V) -  BUN 6 - 20 mg/dL 6 15 -  Creatinine 672(C - 1.00 mg/dL 9.47 0.96 -  Sodium 2.83 - 145 mmol/L 134(L) 135 -  Potassium 3.5 - 5.1 mmol/L 3.9 3.5 -   Chloride 98 - 111 mmol/L 102 105 -  CO2 22 - 32 mmol/L 25 22 -  Calcium 8.9 - 10.3 mg/dL 662) 9.3 9.2  Total Protein 6.5 - 8.1 g/dL - 8.5(H) -  Total Bilirubin 0.3 - 1.2 mg/dL - 0.7 -  Alkaline Phos 38 - 126 U/L - 72 -  AST 15 - 41 U/L - 18 -  ALT 0 - 44 U/L - 22 -     Imaging studies: No new pertinent imaging studies   Assessment/Plan:  35 y.o. female 1 Day Post-Op s/p robotic assisted laparoscopic ventral hernia repair.   - Okay to continue diet as tolerated - Monitor abdominal examination; on-going bowel function - Pain control prn; antiemetics prn   - Okay to continue mobilization     - Discharge Planning: Anticipate DC in the morning pending condition. I have prepped all her discharge information/prescriptions    All of the above findings and recommendations were discussed with the patient, patient's family (mother at bedside), and the medical team, and all of patient's and family's questions were answered to their expressed satisfaction.  -- 30, PA-C [facility] 10/18/2021, 3:25 PM [phone] M-F: 7am - 4pm

## 2021-10-18 NOTE — Op Note (Signed)
Procedure Date:  10/18/2021  Pre-operative Diagnosis:  Incarcerated incisional hernia  Post-operative Diagnosis: Incarcerated incisional hernia  Procedure:  Robotic assisted Incarcerated Incisional Hernia Repair with mesh;  Extensive lysis of adhesions > 90 minutes.  Surgeon:  Howie Ill, MD  Anesthesia:  General endotracheal  Estimated Blood Loss:  30 ml  Specimens:  None  Complications:  None  Findings:  The patient had a "Swiss cheese" defect pattern, with 8 different hernia defects in total.  She also had extensive adhesions from her omentum to the abdominal wall all throughout as well as into the hernia sacs.  There was additional preperitoneal fat in the hernia sacs as well.  Indications for Procedure:  This is a 35 y.o. female who presents with an incarcerated incisional hernia.  She had been recently seen in the ED for the same, which was causing a small bowel obstruction.  The options of surgery versus observation were reviewed with the patient and/or family. The risks of bleeding, abscess or infection, recurrence of symptoms, potential for an open procedure, injury to surrounding structures, and chronic pain were all discussed with the patient and was willing to proceed.  Description of Procedure: The patient was correctly identified in the preoperative area and brought into the operating room.  The patient was placed supine with VTE prophylaxis in place.  Appropriate time-outs were performed.  Anesthesia was induced and the patient was intubated.  Appropriate antibiotics were infused.  The abdomen was prepped and draped in a sterile fashion. The patient's hernia defect was marked with a marking pen.  A Veress needle was introduced in the left upper quadrant and pneumoperitoneum was obtained with appropriate pressures.  Using Optiview technique, an 8 mm port was introduced in the left lateral abdominal wall but initial visualization into the peritoneal cavity was very  obscured, so instead, a 12 mm port was introduced in the left upper quadrant also using Optiview technique, and this was successful with entry without any injury.  After this, the initial 8 mm port was placed without complication.  The patient had omentum adhered at the prior port entry site, which is what was causing the impaired visualization.  Finally, an 8 mm port in the left lower quadrant was placed under direct visualization.  The DaVinci platform was docked, camera targeted, and instruments placed under direct visualization.  The patient had significant adhesions of the omentum to the abdominal wall.  It was very difficult to distinguish the plane between omentum and peritoneum.  The adhesions were very diffuse starting from falciform ligament down to the pelvis.  These were also very dense and required cautery for dissection.  This was very extensive and took more than 90 minutes for dissection.  We started with lysis of adhesions from the Falciform ligament and moving inferiorly, with careful attention to avoid any potential bowel.  As we moved inferiorly, multiple smaller hernia defects were noted particularly in the superior portion, all which contained preperitoneal fat.  These were successfully reduced.  Moving more inferiorly, the patient's main 3 hernia defects noted on her CT scan were encountered.  These contained a combination of preperitoneal fat and omentum.  These were carefully dissected as well and any bleeding was controlled with cautery.  After all the defects were reduced, she was found to have a total of 8 hernia defects.   All adhesions were lysed and the anterior abdominal wall was completely free of adhesions, which allowed for full visualization and confirmation of no further or  missed hernia defects.  A ruler was then inserted and the defects as a whole measured about 15 cm in length.  Three 0 Stratafix sutures were inserted and all the defects were sutured closed without  complications.  Then, a 6 x 8 inch Bard ST Echo mesh was inserted.  A PMI was brought through the center of the hernia defect and the positioning system of the mesh was passed through and insufflated.  This allowed the mesh to splay open and be centered over the repair site with good overlap.  The mesh was coming too close to the ports on the patient's left side, and only the right half of the mesh was able to be sutured in place using 2-0 V-loc sutures.  All needles and ruler were then removed through the 12 mm port without complications.  The DaVinci platform was then undocked and instruments removed.  An additional 5 mm port was then introduced through the right lateral abdominal wall, and a laparoscopic absorbable tacker was used to tack the left side of the mesh in place as well as the center of the mesh against the abdominal wall.  50 ml of Exparel solution mixed with 0.5% bupivacaine with epi was infiltrated around the mesh edges, hernia repair site, and port sites.  The 12 mm port was removed and the fascia was closed under direct visualization utilizing an Endo Close technique with 0 Vicryl suture.  The 8 mm ports were removed. The 12 mm incision was closed using 3-0 Vicryl and 4-0 Monocryl, and the other port incisions were closed with 4-0 Monocryl.  The wounds were cleaned and sealed with DermaBond.  An abdominal binder was placed.  The patient was emerged from anesthesia and extubated and brought to the recovery room for further management.  The patient tolerated the procedure well and all counts were correct at the end of the case.   Howie Ill, MD

## 2021-10-18 NOTE — Discharge Instructions (Signed)
In addition to included general post-operative instructions,  Diet: Resume home diet.   Activity: No heavy lifting >20 pounds (children, pets, laundry, garbage) or strenuous activity for 6 weeks, but light activity and walking are encouraged. Do not drive or drink alcohol if taking narcotic pain medications or having pain that might distract from driving. Encourage you to continue to wear abdominal binder for support and comfort, especially if being active.   Wound care: 2 days after surgery (12/02), you may shower/get incision wet with soapy water and pat dry (do not rub incisions), but no baths or submerging incision underwater until follow-up.   Medications: Resume all home medications. For mild to moderate pain: acetaminophen (Tylenol) or ibuprofen/naproxen (if no kidney disease). Combining Tylenol with alcohol can substantially increase your risk of causing liver disease. Narcotic pain medications, if prescribed, can be used for severe pain, though may cause nausea, constipation, and drowsiness. Do not combine Tylenol and Percocet (or similar) within a 6 hour period as Percocet (and similar) contain(s) Tylenol. If you do not need the narcotic pain medication, you do not need to fill the prescription.  Call office [phone] / [phone]) at any time if any questions, worsening pain, fevers/chills, bleeding, drainage from incision site, or other concerns.

## 2021-10-19 ENCOUNTER — Other Ambulatory Visit: Payer: 59

## 2021-10-19 DIAGNOSIS — K430 Incisional hernia with obstruction, without gangrene: Secondary | ICD-10-CM | POA: Diagnosis not present

## 2021-10-19 MED ORDER — PANTOPRAZOLE SODIUM 40 MG PO TBEC: 40.0000 mg | DELAYED_RELEASE_TABLET | Freq: Every day | ORAL | Status: DC

## 2021-10-19 MED ORDER — ONDANSETRON 4 MG PO TBDP: 4.0000 mg | ORAL_TABLET | Freq: Three times a day (TID) | ORAL | 0 refills | Status: DC | PRN

## 2021-10-19 MED ORDER — GABAPENTIN 300 MG PO CAPS: 300.0000 mg | ORAL_CAPSULE | Freq: Three times a day (TID) | ORAL | 0 refills | Status: DC

## 2021-10-19 MED ORDER — CYCLOBENZAPRINE HCL 7.5 MG PO TABS: 7.5000 mg | ORAL_TABLET | Freq: Three times a day (TID) | ORAL | 0 refills | Status: DC | PRN

## 2021-10-19 MED ORDER — HYDROCODONE-ACETAMINOPHEN 5-325 MG PO TABS: 1.0000 | ORAL_TABLET | Freq: Four times a day (QID) | ORAL | 0 refills | Status: DC | PRN

## 2021-10-19 NOTE — Discharge Summary (Signed)
Patient ID: Tanya Reeves MRN: [id number] DOB/AGE: Jul 25, 1986 35 y.o.  Admit date: 10/17/2021 Discharge date: 10/19/2021   Discharge Diagnoses:  Principal Problem:   Incisional hernia   Procedures:  Robotic assisted incisional hernia repair  Hospital Course:  Patient was admitted after the above surgery on 10/17/21.  She overall had 8 hernia defects that were repaired.  On POD#1, she was doing well but had some additional discomfort and it was decided to keep her an additional day.  Today, she reports feeling better.  She's been ambulating, tolerating diet without nausea, pain is controlled with different medications, and she's ready for discharge home.  On exam, she's in no acute distress, with stable vital signs.  Her abdomen is soft, non-distended, appropriately tender to palpation.  Incisions are clean, dry, intact.  Abdominal binder in place.  Consults: None  Disposition: Discharge disposition: 01-Home or Self Care       Discharge Instructions     Call MD for:  difficulty breathing, headache or visual disturbances   Complete by: As directed    Call MD for:  persistant nausea and vomiting   Complete by: As directed    Call MD for:  redness, tenderness, or signs of infection (pain, swelling, redness, odor or green/yellow discharge around incision site)   Complete by: As directed    Call MD for:  severe uncontrolled pain   Complete by: As directed    Call MD for:  temperature >100.4   Complete by: As directed    Diet - low sodium heart healthy   Complete by: As directed    Discharge instructions   Complete by: As directed    1.  Patient may shower, but do not scrub wounds heavily and dab dry only. 2.  Do not submerge wounds in pool/tub until fully healed. 3.  Do not apply ointments or hydrogen peroxide to the wounds. 4.  May apply ice packs to the wounds for comfort. 5.  Please wear abdominal binder/binders at all times for 4 weeks.  May remove for showers.    Driving Restrictions   Complete by: As directed    Do not drive while taking narcotics for pain control.  Prior to driving, make sure you are able to rotate right and left to look at blindspots without significant pain or discomfort.   Increase activity slowly   Complete by: As directed    Lifting restrictions   Complete by: As directed    No heavy lifting or pushing of more than 10-15 lbs for 6 weeks.   No dressing needed   Complete by: As directed       Allergies as of 10/19/2021       Reactions   Cinnamon Swelling   Tongue swells up. With artificial (such as in gum/red hots)   Dm-guaifenesin Er    Influenza Vaccines Hives   Hives two years in a row.         Medication List     TAKE these medications    albuterol 108 (90 Base) MCG/ACT inhaler Commonly known as: ProAir HFA Inhale 2 puffs into the lungs every 4 (four) hours as needed.   amoxicillin-clavulanate 875-125 MG tablet Commonly known as: AUGMENTIN Take 1 tablet by mouth 2 (two) times daily.   Bariatric Multivitamins/Iron Caps Take 1 capsule by mouth daily.   cyclobenzaprine 7.5 MG tablet Commonly known as: FEXMID Take 1 tablet (7.5 mg total) by mouth 3 (three) times daily as needed for muscle spasms.  eletriptan 20 MG tablet Commonly known as: RELPAX TAKE 1 TABLET BY MOUTH AS NEEDED FOR MIGRAINE OR HEADACHE. MAY REPEAT IN 2 HOURS IF HEADACHE PERSISTS OR RECURS   gabapentin 300 MG capsule Commonly known as: Neurontin Take 1 capsule (300 mg total) by mouth 3 (three) times daily.   HYDROcodone-acetaminophen 5-325 MG tablet Commonly known as: NORCO/VICODIN Take 1-2 tablets by mouth every 6 (six) hours as needed for severe pain. What changed:  how much to take reasons to take this   ondansetron 4 MG disintegrating tablet Commonly known as: Zofran ODT Take 1 tablet (4 mg total) by mouth every 8 (eight) hours as needed for nausea or vomiting. What changed: when to take this                Discharge Care Instructions  (From admission, onward)           Start     Ordered   10/19/21 0000  No dressing needed        10/19/21 0840            Follow-up Information     Henrene Dodge, MD. Go on 10/31/2021.   Specialty: General Surgery Why: s/p laparoscopic ventral hernia repair 10:15appointment arrive at 10am Contact information: [address] [place] [address] [phone]

## 2021-10-19 NOTE — TOC CM/SW Note (Signed)
Patient has orders to discharge home today. Chart reviewed. PCP is Danelle Berry, PA-C. On room air. No discharge wound care needs. No TOC needs identified. CSW signing off.  Charlynn Court, CSW [phone]

## 2021-10-19 NOTE — Progress Notes (Signed)
PHARMACIST - PHYSICIAN COMMUNICATION  CONCERNING: IV to Oral Route Change Policy  RECOMMENDATION: This patient is receiving pantoprazole by the intravenous route.  Based on criteria approved by the Pharmacy and Therapeutics Committee, the intravenous medication(s) is/are being converted to the equivalent oral dose form(s).   DESCRIPTION: These criteria include: The patient is eating (either orally or via tube) and/or has been taking other orally administered medications for a least 24 hours The patient has no evidence of active gastrointestinal bleeding or impaired GI absorption (gastrectomy, short bowel, patient on TNA or NPO).  If you have questions about this conversion, please contact the Pharmacy Department   Tressie Ellis, [facility] 10/19/2021 8:12 AM

## 2021-10-22 ENCOUNTER — Telehealth: Payer: Self-pay | Admitting: Surgery

## 2021-10-22 ENCOUNTER — Other Ambulatory Visit: Payer: Self-pay | Admitting: Surgery

## 2021-10-22 ENCOUNTER — Telehealth: Payer: Self-pay | Admitting: *Deleted

## 2021-10-22 NOTE — Telephone Encounter (Signed)
Patient is calling and is asking if she can get a refill on her Cyclobenzaprine 7.5mg  patinet is using [facility] on [address]. Please call patient and advise.

## 2021-10-22 NOTE — Telephone Encounter (Signed)
Faxed FMLA to [employer] and [employer] at [phone]

## 2021-10-23 ENCOUNTER — Other Ambulatory Visit: Payer: Self-pay

## 2021-10-23 DIAGNOSIS — K430 Incisional hernia with obstruction, without gangrene: Secondary | ICD-10-CM

## 2021-10-23 DIAGNOSIS — M62838 Other muscle spasm: Secondary | ICD-10-CM

## 2021-10-23 MED ORDER — CYCLOBENZAPRINE HCL 7.5 MG PO TABS: 7.5000 mg | ORAL_TABLET | Freq: Three times a day (TID) | ORAL | 0 refills | Status: DC | PRN

## 2021-10-23 MED ORDER — CYCLOBENZAPRINE HCL 5 MG PO TABS
5.0000 mg | ORAL_TABLET | Freq: Three times a day (TID) | ORAL | Status: DC | PRN
Start: 2021-10-23 — End: 2021-10-23

## 2021-10-23 MED ORDER — CYCLOBENZAPRINE HCL 5 MG PO TABS: 5.0000 mg | ORAL_TABLET | Freq: Three times a day (TID) | ORAL | 0 refills | Status: DC | PRN

## 2021-10-23 NOTE — Telephone Encounter (Signed)
Cyclobenzaprine refilled per Dr Aleen Campi.

## 2021-10-25 ENCOUNTER — Encounter: Payer: Self-pay | Admitting: Surgery

## 2021-10-25 ENCOUNTER — Other Ambulatory Visit: Payer: Self-pay

## 2021-10-25 MED ORDER — CEPHALEXIN 500 MG PO CAPS
500.0000 mg | ORAL_CAPSULE | Freq: Two times a day (BID) | ORAL | 0 refills | Status: AC
Start: 2021-10-25 — End: 2021-11-01

## 2021-10-31 ENCOUNTER — Encounter: Payer: 59 | Admitting: Surgery

## 2021-11-02 ENCOUNTER — Ambulatory Visit (INDEPENDENT_AMBULATORY_CARE_PROVIDER_SITE_OTHER): Payer: 59 | Admitting: Surgery

## 2021-11-02 ENCOUNTER — Other Ambulatory Visit: Payer: Self-pay

## 2021-11-02 ENCOUNTER — Encounter: Payer: Self-pay | Admitting: Surgery

## 2021-11-02 VITALS — BP 138/84 | HR 130 | Temp 97.7°F | Ht 63.0 in | Wt 297.6 lb

## 2021-11-02 DIAGNOSIS — K430 Incisional hernia with obstruction, without gangrene: Secondary | ICD-10-CM

## 2021-11-02 DIAGNOSIS — Z09 Encounter for follow-up examination after completed treatment for conditions other than malignant neoplasm: Secondary | ICD-10-CM

## 2021-11-02 NOTE — Progress Notes (Signed)
11/02/2021  HPI: Tanya Reeves is a 35 y.o. female s/p robotic assisted incarcerated incisional hernia repair on 10/17/21.  She called the office on 10/25/21 with concerns for infection of one of her incisions and was started on Keflex for 7 day course.  She reports that is better now.  She does report having soreness and discomfort in the left side incisions, particularly the left lower incision.  She has been wearing the abdominal binder which as been helping.  Vital signs: BP 138/84    Pulse (!) 130    Temp 97.7 F (36.5 C) (Oral)    Ht 5\' 3"  (1.6 m)    Wt 297 lb 9.6 oz (135 kg)    SpO2 97%    BMI 52.72 kg/m    Physical Exam: Constitutional:   No acute distress Abdomen:  Soft, non-distended, with appropriate soreness to palpation over incisions and hernia site.  There is residual small seroma with hernia sac palpable to the right of umbilicus, but no evidence of hernia recurrence.  The incisions are healing well and are clean, dry, intact, without any evidence of infection anymore.  Assessment/Plan: This is a 35 y.o. female s/p robotic assisted incarcerated incisional hernia repair.  --Discussed with the patient that her left lower incision does not show signs of infection anymore.  The discomfort that she's having I think is related to the weight of the pannus pulling down on her incisions and hernia repair.  Recommended that she continue wearing the abdominal binder to help with support and pain.  The scar tissue may also be irritating superficial sensory nerves.  This should improve as everything continues to heal. --She still has no heavy lifting/pushing restrictions for total of 4-6 weeks after surgery. --Follow up as needed.  Discussed with her return precautions.   30, MD [facility]

## 2021-11-02 NOTE — Patient Instructions (Addendum)
If you have any concerns or questions, please feel free to call our office. Follow up as needed.  Laparoscopic Ventral Hernia Repair, Care After  The following information offers guidance on how to care for yourself after your procedure. Your health care provider may also give you more specific instructions. If you have problems or questions, contact your health care provider. What can I expect after the procedure? After the procedure, it is common to have pain, discomfort, or soreness. Follow these instructions at home: Medicines Take over-the-counter and prescription medicines only as told by your health care provider. Ask your health care provider if the medicine prescribed to you: Requires you to avoid driving or using machinery. Can cause constipation. You may need to take these actions to prevent or treat constipation: Drink enough fluid to keep your urine pale yellow. Take over-the-counter or prescription medicines. Eat foods that are high in fiber, such as beans, whole grains, and fresh fruits and vegetables. Limit foods that are high in fat and processed sugars, such as fried or sweet foods. Incision care  Follow instructions from your health care provider about how to take care of your incisions. Make sure you: Wash your hands with soap and water for at least 20 seconds before and after you change your bandage (dressing) or before you touch your abdomen. If soap and water are not available, use hand sanitizer. Change your dressing as told by your health care provider. Leave stitches (sutures), skin glue, or adhesive strips in place. These skin closures may need to stay in place for 2 weeks or longer. If adhesive strip edges start to loosen and curl up, you may trim the loose edges. Do not remove adhesive strips completely unless your health care provider tells you to do that. Check your incision areas every day for signs of infection. Check for: More redness, swelling, or  pain. Fluid or blood. Warmth. Pus or a bad smell. Bathing  Do not take baths, swim, or use a hot tub until your health care provider approves. Ask your health care provider if you may take showers. You may only be allowed to take sponge baths. Keep your dressing dry until your health care provider says it can be removed. Activity  Rest as told by your health care provider. Avoid sitting for a long time without moving. Get up to take short walks every 1-2 hours. This is important to improve blood flow and breathing. Ask for help if you feel weak or unsteady. Do not lift anything that is heavier than 10 lb (4.5 kg), or the limit that you are told, until your health care provider says that it is safe. If you were given a sedative during the procedure, it can affect you for several hours. Do not drive or operate machinery until your health care provider says that it is safe. Return to your normal activities as told by your health care provider. Ask your health care provider what activities are safe for you. General instructions  Hold a pillow over your abdomen when you cough or sneeze. This helps with pain. Do not use any products that contain nicotine or tobacco. These products include cigarettes, chewing tobacco, and vaping devices, such as e-cigarettes. These can delay healing after surgery. If you need help quitting, ask your health care provider. You may be asked to continue to do deep breathing exercises at home. This will help to prevent a lung infection. Keep all follow-up visits. This is important. Contact a health care provider   if: You have any of these signs of infection: More redness, swelling, or pain around an incision. Fluid or blood coming from an incision. Warmth coming from an incision. Pus or a bad smell coming from an incision. A fever or chills. You have pain that gets worse or does not get better with medicine. You have nausea or vomiting. You have a cough. You have  shortness of breath. You have not had a bowel movement in 3 days. You are not able to urinate. Get help right away if you have: Severe pain in your abdomen. Persistent nausea and vomiting. Redness, warmth, or pain in your leg. Chest pain. Trouble breathing. These symptoms may represent a serious problem that is an emergency. Do not wait to see if the symptoms will go away. Get medical help right away. Call your local emergency services (911 in the [place] Do not drive yourself to the hospital. Summary After this procedure, it is common to have pain, discomfort, or soreness. Follow instructions from your health care provider about how to take care of your incision. Check your incision area every day for signs of infection. Report any signs of infection to your health care provider. Keep all follow-up visits. This is important. This information is not intended to replace advice given to you by your health care provider. Make sure you discuss any questions you have with your health care provider. Document Revised: 06/23/2020 Document Reviewed: 06/23/2020 Elsevier Patient Education  2022 Elsevier Inc.  

## 2022-02-05 ENCOUNTER — Telehealth: Payer: Self-pay | Admitting: *Deleted

## 2022-02-05 NOTE — Telephone Encounter (Signed)
Just a FYI

## 2022-02-05 NOTE — Telephone Encounter (Signed)
Pt called to make an appt which was made for tomorrow by the agent. Pt then told agent some symptoms she wanted to be seen for, agent put through for a nurse to discuss with pt. Patient hung up while nurse was speaking with agent. Called back, message left. ?

## 2022-02-06 ENCOUNTER — Encounter: Payer: Self-pay | Admitting: Family Medicine

## 2022-02-06 ENCOUNTER — Ambulatory Visit (INDEPENDENT_AMBULATORY_CARE_PROVIDER_SITE_OTHER): Payer: No Typology Code available for payment source | Admitting: Family Medicine

## 2022-02-06 VITALS — BP 132/80 | HR 82 | Temp 98.0°F | Resp 18 | Ht 63.0 in | Wt 301.6 lb

## 2022-02-06 DIAGNOSIS — Z1159 Encounter for screening for other viral diseases: Secondary | ICD-10-CM

## 2022-02-06 DIAGNOSIS — M25541 Pain in joints of right hand: Secondary | ICD-10-CM | POA: Insufficient documentation

## 2022-02-06 DIAGNOSIS — M25542 Pain in joints of left hand: Secondary | ICD-10-CM

## 2022-02-06 DIAGNOSIS — Z6841 Body Mass Index (BMI) 40.0 and over, adult: Secondary | ICD-10-CM

## 2022-02-06 DIAGNOSIS — Z9884 Bariatric surgery status: Secondary | ICD-10-CM

## 2022-02-06 DIAGNOSIS — G4730 Sleep apnea, unspecified: Secondary | ICD-10-CM | POA: Insufficient documentation

## 2022-02-06 MED ORDER — PREDNISONE 20 MG PO TABS: 40.0000 mg | ORAL_TABLET | Freq: Every day | ORAL | 0 refills | Status: AC

## 2022-02-06 NOTE — Progress Notes (Signed)
? ?  SUBJECTIVE:  ? ?CHIEF COMPLAINT / HPI:  ? ?ARM PAIN ?- endorsing bilateral hand/joint pain that started 3 weeks ago after yard work at [employer]. Progressively worsening and now going down fingers, up arm to elbow. Also with grip weakness with holding objects, turning steering wheel, opening packages, R>L. Also with intermittent muscle spasms ?- is R handed. 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 ?- denies swelling, redness, bruising, fevers.  ?- has tried tylenol, compression, ice, heat without improvement. Unable to take ibuprofen due to h/o bariatric surgery. ?- has h/o carpal tunnel syndrome ?- reports prior MRI years ago  early 2s during w/u for chronic migraines that showed ?early parkinson's disease but denies prior symptoms.  ? ?H/o bariatric surgery - needs recheck labs for vitamin deficiencies per surgeon. Taking bariatric multivitamin.  ? ? ?OBJECTIVE:  ? ?BP 132/80 (BP Location: Right Arm, Patient Position: Sitting, Cuff Size: Normal)   Pulse 82   Temp 98 ?F (36.7 ?C) (Oral)   Resp 18   Ht _0  (1.6 m)   Wt (!) 301 lb 9.6 oz (136.8 kg)   SpO2 99%   BMI 53.43 kg/m?   ?Gen: well appearing, in NAD. obese ?MSK/Neuro: Full AROM. 5/5 strength with biceps, triceps, deltoid. 4/5 R sided grip strength, pincer grasp, finger abduction/adduction. Intact symmetric sensation to light touch of extremities bilaterally. Negative Tinels, Phalen's, reverse Phalen's. ? ?Limited US of R hand: ?Findings: Minimal spurring of 1st-5th CMC joints without increased hypervascularity. Median nerve area at carpal tunnel 0.05cm2. No hypoechoic changes or increase vascularity along extensor tendons. ?Impression: No evidence of carpal tunnel syndrome, tendinopathy, inflammatory arthritis. ? ? ?ASSESSMENT/PLAN:  ? ?Problem List Items Addressed This Visit   ? ?  ? Other  ? Arthralgia of both hands  ?  With associated muscle weakness, unclear etiology. 4/5 strength in median, ulnar, radial nerve distributions however  normal appearing median nerve at the carpal tunnel. No tendinopathy or joint inflammation seen on bedside US. May be due to nerve entrapment more proximally however demonstrated weakness in all nerve distributions and entrapment not consistent with proximally progressing symptoms. Will obtain labs to assess for potential autoimmune, infectious contributors though less likely. Trial steroid burst. F/u in 2 weeks.  ?  ?  ? Relevant Medications  ? predniSONE (DELTASONE) 20 MG tablet  ? Other Relevant Orders  ? C-reactive protein  ? Sed Rate (ESR)  ? ANA,IFA RA Diag Pnl w/rflx Tit/Patn  ? Comprehensive metabolic panel  ? CBC with Differential  ? Class 3 severe obesity with body mass index (BMI) of 50.0 to 59.9 in adult [facility])  ? Relevant Orders  ? Hemoglobin A1c  ? Comprehensive metabolic panel  ? Lipid panel  ? Status post bariatric surgery  ? Relevant Orders  ? Hemoglobin A1c  ? Comprehensive metabolic panel  ? CBC with Differential  ? Lipid panel  ? B12  ? Vitamin E  ? Vitamin A  ? Vitamin K1, Serum  ? Fe+TIBC+Fer  ? Zinc  ? ?Other Visit Diagnoses   ? ? Need for hepatitis C screening test    -  Primary  ? Relevant Orders  ? Hepatitis C Antibody  ? ?  ? ? ? ? ?Myles Gip, DO ?

## 2022-02-06 NOTE — Patient Instructions (Signed)
It was great to see you! ? ?Our plans for today:  ?- Take the steroids as prescribed. ?- We are checking some labs today, we will release these results to your MyChart. ?- Come back in 2 weeks. ? ?Take care and seek immediate care sooner if you develop any concerns.  ? ?Dr. Ky Barban ? ?

## 2022-02-06 NOTE — Assessment & Plan Note (Signed)
With associated muscle weakness, unclear etiology. 4/5 strength in median, ulnar, radial nerve distributions however normal appearing median nerve at the carpal tunnel. No tendinopathy or joint inflammation seen on bedside US. May be due to nerve entrapment more proximally however demonstrated weakness in all nerve distributions and entrapment not consistent with proximally progressing symptoms. Will obtain labs to assess for potential autoimmune, infectious contributors though less likely. Trial steroid burst. F/u in 2 weeks.  ?

## 2022-02-06 NOTE — Progress Notes (Deleted)
Name: Tanya Reeves   MRN: [id number]    DOB: [birth year]   Date:02/06/2022 ? ?     Progress Note ? ?Subjective ? ?Chief Complaint ? ?Chief Complaint  ?Patient presents with  ? Weakness  ? ? ?HPI ? ?Acute visit  ?Patient Active Problem List  ? Diagnosis Date Noted  ? Sleep apnea 02/06/2022  ? Incisional hernia 10/17/2021  ? Incisional hernia with obstruction but no gangrene   ? History of small bowel obstruction 02/09/2021  ? Palpitations 02/09/2021  ? 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 Asthma 07/18/2017  ? Hidradenitis suppurativa 03/24/2017  ? Class 3 severe obesity with body mass index (BMI) of 50.0 to 59.9 in adult [facility]) 03/17/2016  ? Migraine without aura 02/26/2016  ? OSA on CPAP 02/26/2016  ? Gestational diabetes mellitus, antepartum 09/23/2014  ? Left ventricular hypertrophy 08/04/2014  ? Obesity affecting pregnancy 08/04/2014  ? Left ventricular hypertrophy 08/04/2014  ? Status post bariatric surgery 07/07/2014  ? Previous cesarean delivery affecting pregnancy 07/07/2014  ? ? ?Past Surgical History:  ?Procedure Laterality Date  ? CESAREAN SECTION    ? 2  ? CHOLECYSTECTOMY    ? COLON SURGERY    ? part of bowel removed due to c-section  ? INSERTION OF MESH  10/17/2021  ? Procedure: INSERTION OF MESH;  Surgeon: Olean Ree, MD;  Location: [facility] ORS;  Service: General;;  ? sips    ? STOMACH SURGERY    ? XI ROBOTIC ASSISTED VENTRAL HERNIA N/A 10/17/2021  ? Procedure: XI ROBOTIC ASSISTED VENTRAL HERNIA, incisional;  Surgeon: Olean Ree, MD;  Location: [facility] ORS;  Service: General;  Laterality: N/A;  Incisional hernia  ? ? ?Family History  ?Problem Relation Age of Onset  ? Diabetes Mother   ? Cancer Mother   ?     femal organs  ? Hypoparathyroidism Mother   ? Asthma Mother   ? Diabetes Father   ? Juvenile idiopathic arthritis Daughter   ? Diabetes Maternal Grandmother   ? Diabetes Maternal Grandfather   ? Leukemia Paternal Grandmother   ?  Aneurysm Paternal Grandfather   ? ? ?Social History  ? ?Tobacco Use  ? Smoking status: Never  ? Smokeless tobacco: Never  ?Substance Use Topics  ? Alcohol use: Yes  ?  Alcohol/week: 0.0 standard drinks  ?  Comment: wine - 2 drinks per month  ? ? ? ?Current Outpatient Medications:  ?  albuterol (PROAIR HFA) 108 (90 Base) MCG/ACT inhaler, Inhale 2 puffs into the lungs every 4 (four) hours as needed., Disp: 1 each, Rfl: 2 ?  cyclobenzaprine (FLEXERIL) 5 MG tablet, Take 1 tablet (5 mg total) by mouth 3 (three) times daily as needed for muscle spasms., Disp: 30 tablet, Rfl: 0 ?  eletriptan (RELPAX) 20 MG tablet, TAKE 1 TABLET BY MOUTH AS NEEDED FOR MIGRAINE OR HEADACHE. MAY REPEAT IN 2 HOURS IF HEADACHE PERSISTS OR RECURS, Disp: 6 tablet, Rfl: 5 ?  Multiple Vitamins-Minerals (BARIATRIC MULTIVITAMINS/IRON) CAPS, Take 1 capsule by mouth daily., Disp: , Rfl:  ?  ondansetron (ZOFRAN ODT) 4 MG disintegrating tablet, Take 1 tablet (4 mg total) by mouth every 8 (eight) hours as needed for nausea or vomiting., Disp: 30 tablet, Rfl: 0 ?  gabapentin (NEURONTIN) 300 MG capsule, Take 1 capsule (300 mg total) by mouth 3 (three) times daily., Disp: 90 capsule, Rfl: 0 ? ?Allergies  ?Allergen Reactions  ? Cinnamon Swelling  ?  Tongue swells up.  With artificial (such as in gum/red hots)  ? Dm-Guaifenesin Er   ? Influenza Vaccines Hives  ?  Hives two years in a row.  ?Hives two years in a row.   ? ? ?I personally reviewed {Reviewed:14835} with the patient/caregiver today. ? ? ?ROS ? ?*** ? ?Objective ? ?There were no vitals filed for this visit. ? ?There is no height or weight on file to calculate BMI. ? ?Physical Exam ?*** ? ?No results found for this or any previous visit (from the past 2160 hour(s)). ? ?Diabetic Foot Exam: ?Diabetic Foot Exam - Simple   ?No data filed ?  ? ?*** ? ?PHQ2/9: ? ?  02/06/2022  ?  8:32 AM 10/10/2021  ?  1:59 PM 06/04/2021  ? 12:07 PM 02/09/2021  ? 10:26 AM 02/16/2020  ?  9:19 AM  ?Depression screen PHQ 2/9   ?Decreased Interest 0 0 0 0 0  ?Down, Depressed, Hopeless 0 0 0 0 0  ?PHQ - 2 Score 0 0 0 0 0  ?Altered sleeping 0 0 0 0 0  ?Tired, decreased energy 0 0 0 0 0  ?Change in appetite 0 0 0 0 0  ?Feeling bad or failure about yourself  0 0 0 0 0  ?Trouble concentrating 0 0 0 0 0  ?Moving slowly or fidgety/restless 0 0 0 0 0  ?Suicidal thoughts 0 0 0 0 0  ?PHQ-9 Score 0 0 0 0 0  ?Difficult doing work/chores  Not difficult at all Not difficult at all Not difficult at all Not difficult at all  ?  ?phq 9 is {gen pos neg:[id number]} ?*** ? ?Fall Risk: ? ?  02/06/2022  ?  8:32 AM 10/10/2021  ?  1:59 PM 06/04/2021  ? 12:07 PM 02/09/2021  ? 10:26 AM 02/16/2020  ?  9:19 AM  ?Fall Risk   ?Falls in the past year? 0 1 0 0 0  ?Number falls in past yr:  0 0 0 0  ?Injury with Fall?  0 0 0 0  ?Risk for fall due to :  Impaired balance/gait     ?Follow up Falls prevention discussed Falls prevention discussed     ? ?*** ? ? ?Functional Status Survey: ?Is the patient deaf or have difficulty hearing?: No ?Does the patient have difficulty seeing, even when wearing glasses/contacts?: No ?Does the patient have difficulty concentrating, remembering, or making decisions?: No ?Does the patient have difficulty walking or climbing stairs?: No ?Does the patient have difficulty dressing or bathing?: No ?Does the patient have difficulty doing errands alone such as visiting a doctor's office or shopping?: No ?*** ? ? ?Assessment & Plan ? ?*** ?1. Need for hepatitis C screening test ?*** ? ?

## 2022-02-07 LAB — COMPREHENSIVE METABOLIC PANEL
AG Ratio: 1.4 (calc) (ref 1.0–2.5)
ALT: 13 U/L (ref 6–29)
AST: 9 U/L — ABNORMAL LOW (ref 10–30)
Albumin: 4.2 g/dL (ref 3.6–5.1)
Alkaline phosphatase (APISO): 62 U/L (ref 31–125)
BUN/Creatinine Ratio: 30 (calc) — ABNORMAL HIGH (ref 6–22)
BUN: 14 mg/dL (ref 7–25)
CO2: 25 mmol/L (ref 20–32)
Calcium: 9.2 mg/dL (ref 8.6–10.2)
Chloride: 104 mmol/L (ref 98–110)
Creat: 0.47 mg/dL — ABNORMAL LOW (ref 0.50–0.97)
Globulin: 3 g/dL (calc) (ref 1.9–3.7)
Glucose, Bld: 85 mg/dL (ref 65–99)
Potassium: 4.3 mmol/L (ref 3.5–5.3)
Sodium: 139 mmol/L (ref 135–146)
Total Bilirubin: 0.2 mg/dL (ref 0.2–1.2)
Total Protein: 7.2 g/dL (ref 6.1–8.1)

## 2022-02-07 LAB — HEMOGLOBIN A1C
Hgb A1c MFr Bld: 5.9 % of total Hgb — ABNORMAL HIGH (ref <5.7)
Mean Plasma Glucose: 123 mg/dL
eAG (mmol/L): 6.8 mmol/L

## 2022-02-08 ENCOUNTER — Encounter: Payer: Self-pay | Admitting: Surgery

## 2022-02-08 ENCOUNTER — Encounter: Payer: Self-pay | Admitting: Family Medicine

## 2022-02-11 NOTE — Addendum Note (Signed)
Addended by: Caro Laroche on: 02/11/2022 08:49 AM ? ? Modules accepted: Orders ? ?

## 2022-02-16 LAB — ANA,IFA RA DIAG PNL W/RFLX TIT/PATN
Anti Nuclear Antibody (ANA): POSITIVE — AB
Cyclic Citrullin Peptide Ab: <16 UNITS
Rheumatoid fact SerPl-aCnc: <14 IU/mL (ref <14)

## 2022-02-16 LAB — CBC WITH DIFFERENTIAL/PLATELET
Absolute Monocytes: 410 cells/uL (ref 200–950)
Basophils Absolute: 72 cells/uL (ref 0–200)
Basophils Relative: 1.1 %
Eosinophils Absolute: 228 cells/uL (ref 15–500)
Eosinophils Relative: 3.5 %
HCT: 35.2 % (ref 35.0–45.0)
Hemoglobin: 11.2 g/dL — ABNORMAL LOW (ref 11.7–15.5)
Lymphs Abs: 2067 cells/uL (ref 850–3900)
MCH: 25.3 pg — ABNORMAL LOW (ref 27.0–33.0)
MCHC: 31.8 g/dL — ABNORMAL LOW (ref 32.0–36.0)
MCV: 79.6 fL — ABNORMAL LOW (ref 80.0–100.0)
MPV: 11.1 fL (ref 7.5–12.5)
Monocytes Relative: 6.3 %
Neutro Abs: 3725 cells/uL (ref 1500–7800)
Neutrophils Relative %: 57.3 %
Platelets: 281 10*3/uL (ref 140–400)
RBC: 4.42 10*6/uL (ref 3.80–5.10)
RDW: 14.2 % (ref 11.0–15.0)
Total Lymphocyte: 31.8 %
WBC: 6.5 10*3/uL (ref 3.8–10.8)

## 2022-02-16 LAB — LIPID PANEL
Cholesterol: 166 mg/dL (ref <200)
HDL: 48 mg/dL — ABNORMAL LOW (ref >=50)
LDL Cholesterol (Calc): 95 mg/dL (calc)
Non-HDL Cholesterol (Calc): 118 mg/dL (calc) (ref <130)
Total CHOL/HDL Ratio: 3.5 (calc) (ref <5.0)
Triglycerides: 135 mg/dL (ref <150)

## 2022-02-16 LAB — IRON,TIBC AND FERRITIN PANEL
%SAT: 11 % (calc) — ABNORMAL LOW (ref 16–45)
Ferritin: 6 ng/mL — ABNORMAL LOW (ref 16–154)
Iron: 47 ug/dL (ref 40–190)
TIBC: 429 mcg/dL (calc) (ref 250–450)

## 2022-02-16 LAB — VITAMIN B12: Vitamin B-12: 1619 pg/mL — ABNORMAL HIGH (ref 200–1100)

## 2022-02-16 LAB — ANTI-NUCLEAR AB-TITER (ANA TITER)
ANA TITER: 1:80 {titer} — ABNORMAL HIGH
ANA Titer 1: 1:40 {titer} — ABNORMAL HIGH

## 2022-02-16 LAB — VITAMIN E
Gamma-Tocopherol (Vit E): 1 mg/L (ref <=4.3)
Vitamin E (Alpha Tocopherol): 11.9 mg/L (ref 5.7–19.9)

## 2022-02-16 LAB — VITAMIN A: Vitamin A (Retinoic Acid): 48 ug/dL (ref 38–98)

## 2022-02-16 LAB — C-REACTIVE PROTEIN: CRP: 8.2 mg/L — ABNORMAL HIGH (ref <8.0)

## 2022-02-16 LAB — ZINC: Zinc: 75 ug/dL (ref 60–130)

## 2022-02-16 LAB — SEDIMENTATION RATE: Sed Rate: 31 mm/h — ABNORMAL HIGH (ref 0–20)

## 2022-02-16 LAB — HEPATITIS C ANTIBODY
Hepatitis C Ab: NONREACTIVE
SIGNAL TO CUT-OFF: 0.03 (ref <1.00)

## 2022-02-16 LAB — VITAMIN K1, SERUM: Vitamin K: 1080 pg/mL (ref 130–1500)

## 2022-02-21 ENCOUNTER — Other Ambulatory Visit: Payer: Self-pay | Admitting: Rheumatology

## 2022-02-21 ENCOUNTER — Other Ambulatory Visit (HOSPITAL_COMMUNITY): Payer: Self-pay | Admitting: Rheumatology

## 2022-02-21 DIAGNOSIS — M6281 Muscle weakness (generalized): Secondary | ICD-10-CM

## 2022-02-21 DIAGNOSIS — R29898 Other symptoms and signs involving the musculoskeletal system: Secondary | ICD-10-CM

## 2022-02-21 DIAGNOSIS — M62838 Other muscle spasm: Secondary | ICD-10-CM

## 2022-02-22 ENCOUNTER — Ambulatory Visit
Admission: RE | Admit: 2022-02-22 | Discharge: 2022-02-22 | Disposition: A | Discharge status: Home / Self Care | Payer: No Typology Code available for payment source | Source: Ambulatory Visit | Attending: Rheumatology | Admitting: Rheumatology

## 2022-02-22 DIAGNOSIS — M6281 Muscle weakness (generalized): Secondary | ICD-10-CM | POA: Insufficient documentation

## 2022-02-22 DIAGNOSIS — M62838 Other muscle spasm: Secondary | ICD-10-CM | POA: Insufficient documentation

## 2022-02-22 DIAGNOSIS — R29898 Other symptoms and signs involving the musculoskeletal system: Secondary | ICD-10-CM | POA: Insufficient documentation

## 2022-02-22 IMAGING — MR MR CERVICAL SPINE WO/W CM
5 of 8 series · 29 of 48 positions shown · IV contrast (10ml Gadavist)
Comparison: None available. Report only from outside cervical spine
radiographs [DATE].

CLINICAL DATA: Finger pain and weakness for 5 weeks, recently
worsening. Loss of bowel and bladder function. No acute injury or
prior relevant surgery.

EXAM:
MRI CERVICAL SPINE WITHOUT AND WITH CONTRAST
TECHNIQUE: Multiplanar and multiecho pulse sequences of the cervical spine, to
include the craniocervical junction and cervicothoracic junction,
were obtained without and with intravenous contrast.
CONTRAST:  10mL GADAVIST GADOBUTROL 1 MMOL/ML IV SOLN

[Series 5: T2 · sagittal · 3.0mm · 0.56mm/px · 4 of 13 slices shown (1 of 2)]
[im 1/13]
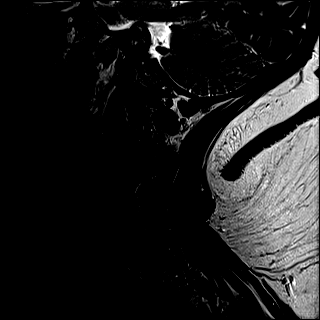
[im 5/13]
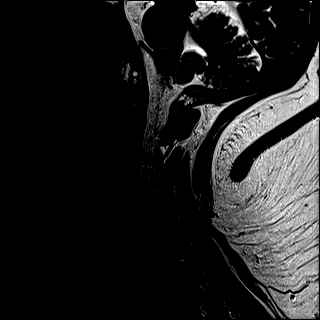
[im 9/13]
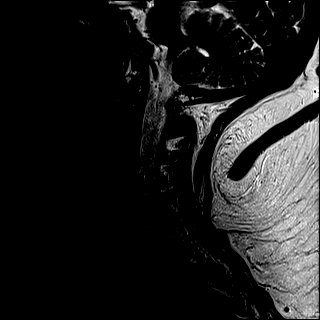
[im 13/13]
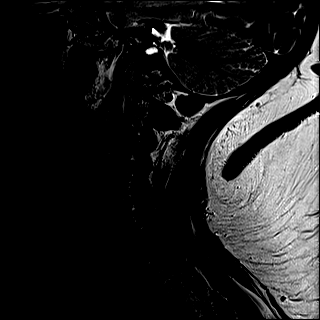

[Series 7: STIR · sagittal · 3.0mm · 0.62mm/px · 4 of 13 slices shown]
[im 1/13]
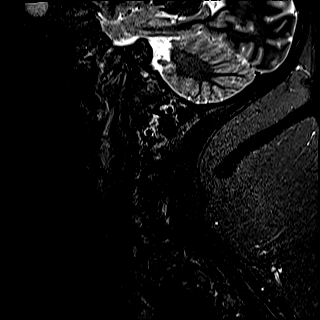
[im 5/13]
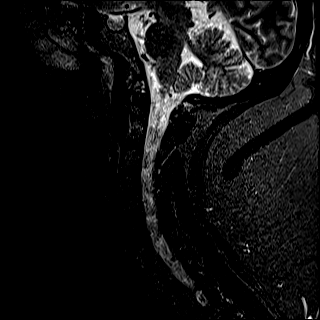
[im 9/13]
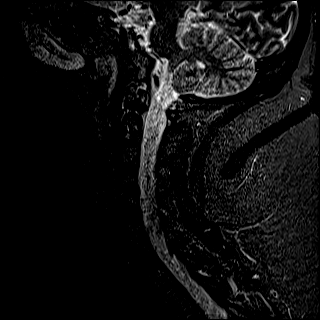
[im 13/13]
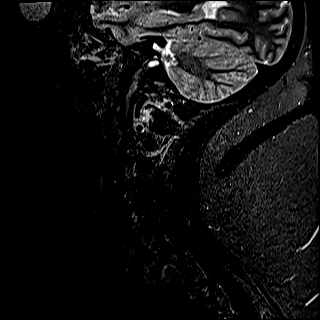

[Series 8: T2 · axial · 3.0mm · 0.68mm/px · z∈[-72,+26]mm · 8 of 29 slices shown (2 of 2)]
[im 1/29]
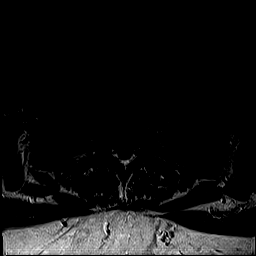
[im 5/29]
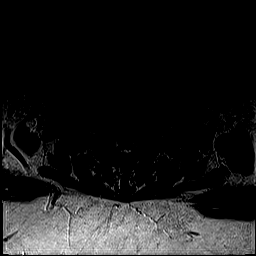
[im 9/29]
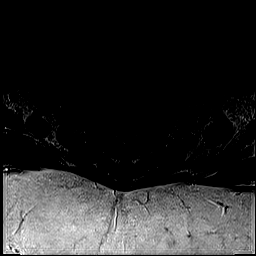
[im 13/29]
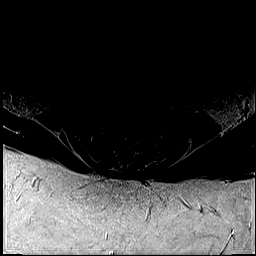
[im 17/29]
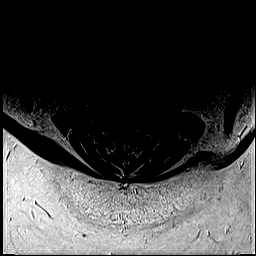
[im 21/29]
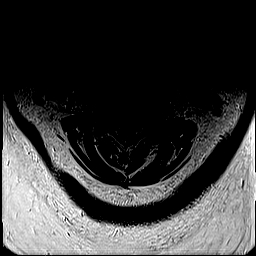
[im 25/29]
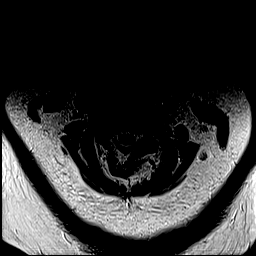
[im 29/29]
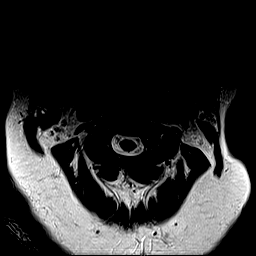

[Series 10: T1 · axial · non-contrast · 3.0mm · 0.35mm/px · z∈[-72,+25]mm · 8 of 29 slices shown]
[im 1/29]
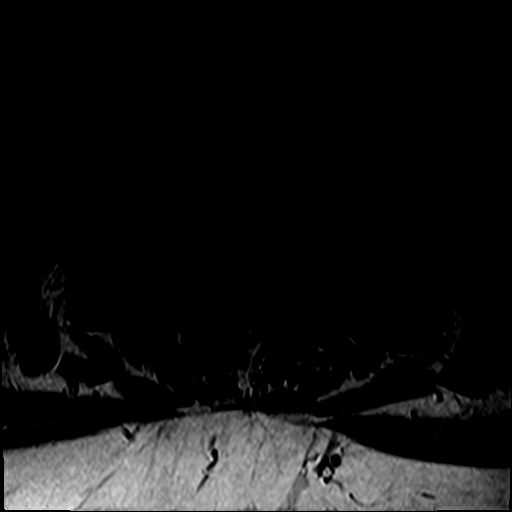
[im 5/29]
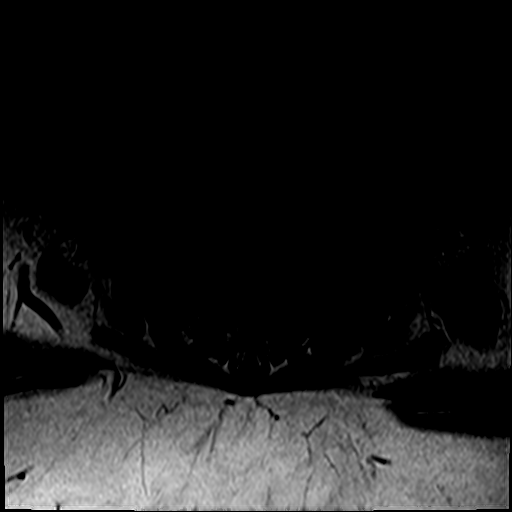
[im 9/29]
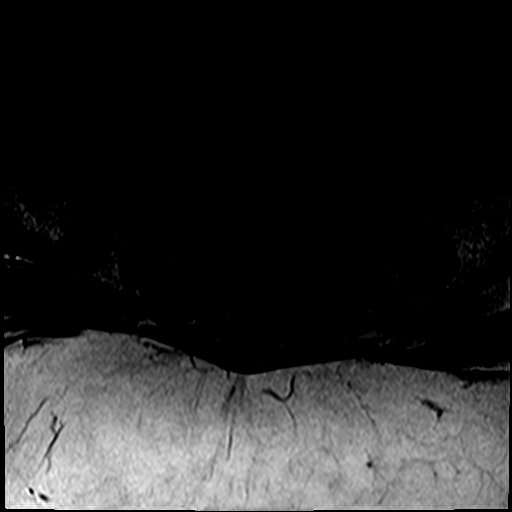
[im 13/29]
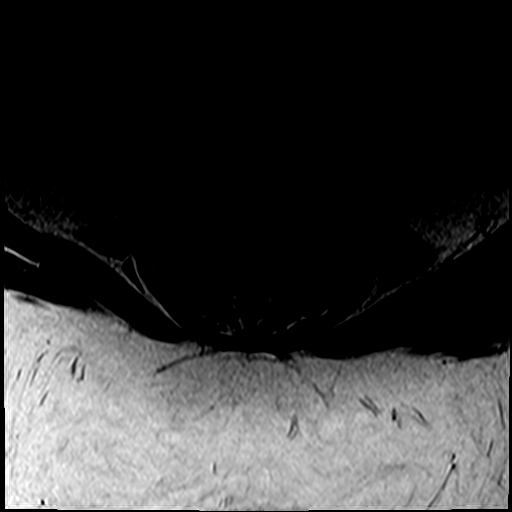
[im 17/29]
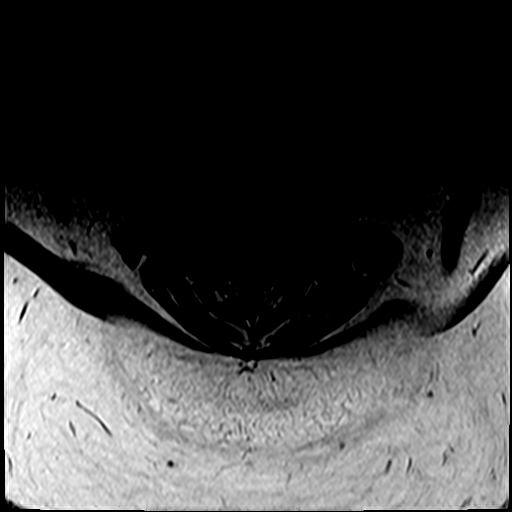
[im 21/29]
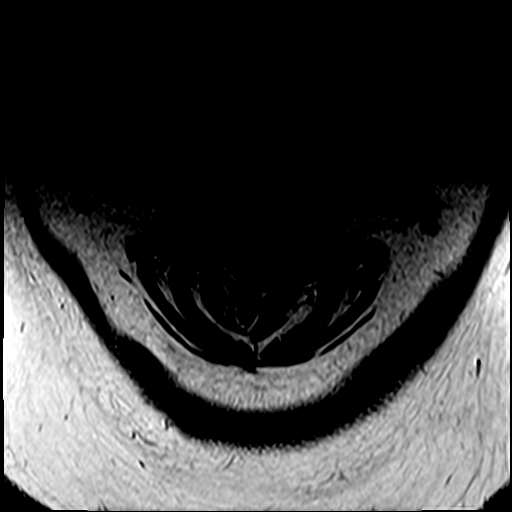
[im 25/29]
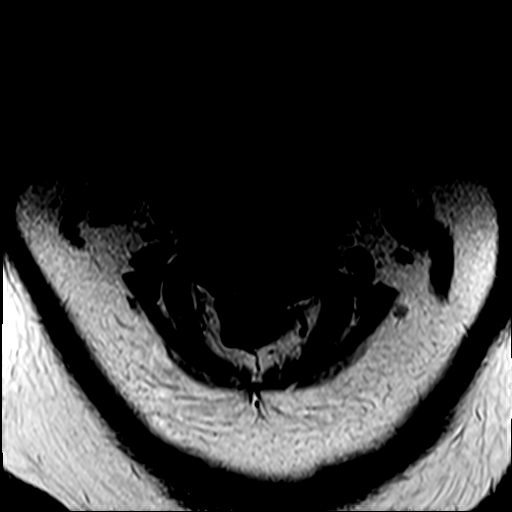
[im 29/29]
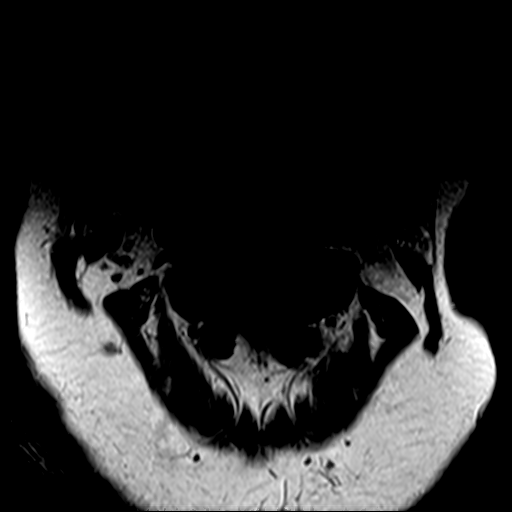

[Series 12: T1 post-contrast · axial · 3.0mm · 0.35mm/px · z∈[-72,-16]mm · 5 of 29 slices shown]
[im 1/29]
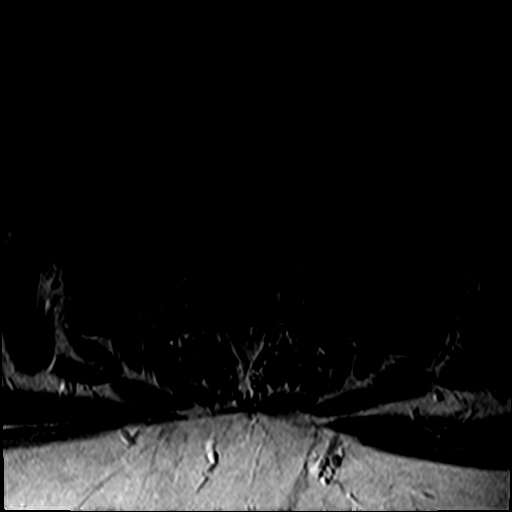
[im 5/29]
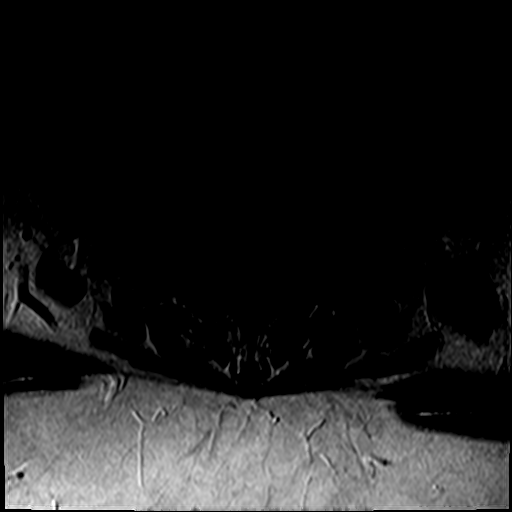
[im 9/29]
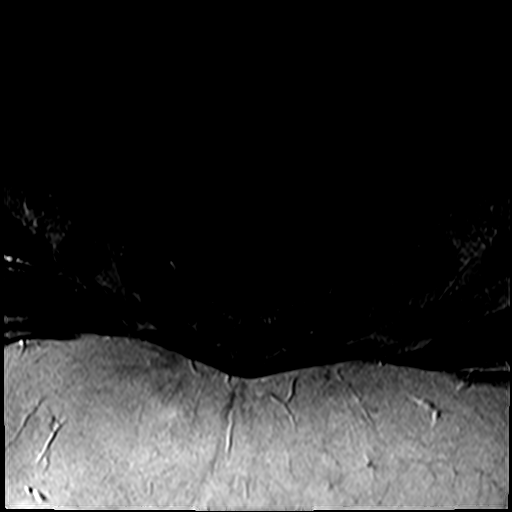
[im 13/29]
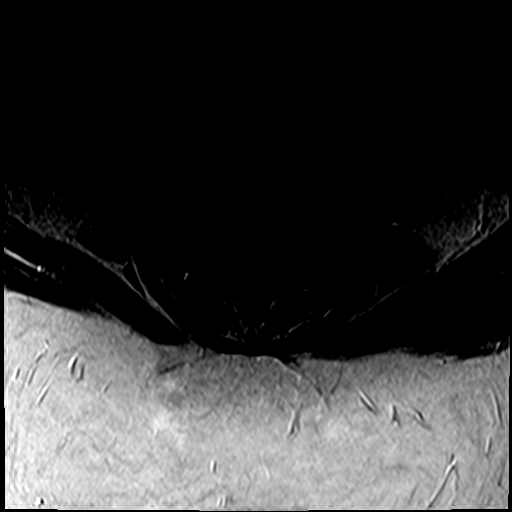
[im 17/29]
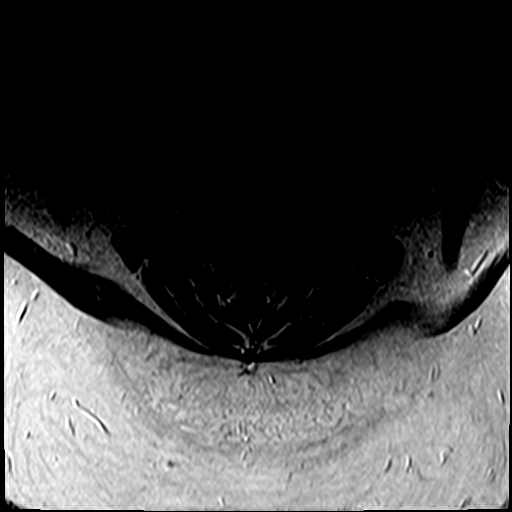

[29 of 48 positions shown; findings below may reference images not displayed]

FINDINGS: Alignment: Physiologic.

Vertebrae: No acute or suspicious osseous findings. No abnormal
osseous enhancement.

Cord: Normal in signal and caliber.

Posterior Fossa, vertebral arteries, paraspinal tissues: Visualized
portions of the posterior fossa appear unremarkable.Bilateral
vertebral artery flow voids. No significant paraspinal findings.

Disc levels:

The cervical spinal canal is widely patent. There is no disc
herniation, spinal stenosis or nerve root encroachment at any level.
No abnormal intradural enhancement demonstrated.
IMPRESSION: Normal MRI of the cervical spine.

## 2022-02-22 MED ORDER — GADOBUTROL 1 MMOL/ML IV SOLN
10.0000 mL | Freq: Once | INTRAVENOUS | Status: AC | PRN
  Administered 2022-02-22: 10 mL via INTRAVENOUS

## 2022-02-25 ENCOUNTER — Other Ambulatory Visit: Payer: Self-pay | Admitting: Physician Assistant

## 2022-02-25 ENCOUNTER — Ambulatory Visit: Payer: No Typology Code available for payment source | Admitting: Family Medicine

## 2022-02-25 DIAGNOSIS — R531 Weakness: Secondary | ICD-10-CM

## 2022-02-25 DIAGNOSIS — R2689 Other abnormalities of gait and mobility: Secondary | ICD-10-CM

## 2022-02-25 DIAGNOSIS — R479 Unspecified speech disturbances: Secondary | ICD-10-CM

## 2022-02-25 DIAGNOSIS — R1310 Dysphagia, unspecified: Secondary | ICD-10-CM

## 2022-02-25 DIAGNOSIS — R0600 Dyspnea, unspecified: Secondary | ICD-10-CM

## 2022-02-25 DIAGNOSIS — G379 Demyelinating disease of central nervous system, unspecified: Secondary | ICD-10-CM

## 2022-02-27 ENCOUNTER — Ambulatory Visit
Admission: RE | Admit: 2022-02-27 | Discharge: 2022-02-27 | Disposition: A | Discharge status: Home / Self Care | Payer: No Typology Code available for payment source | Source: Ambulatory Visit | Attending: Physician Assistant | Admitting: Physician Assistant

## 2022-02-27 DIAGNOSIS — R531 Weakness: Secondary | ICD-10-CM

## 2022-02-27 DIAGNOSIS — R0600 Dyspnea, unspecified: Secondary | ICD-10-CM

## 2022-02-27 DIAGNOSIS — G379 Demyelinating disease of central nervous system, unspecified: Secondary | ICD-10-CM

## 2022-02-27 DIAGNOSIS — R479 Unspecified speech disturbances: Secondary | ICD-10-CM | POA: Insufficient documentation

## 2022-02-27 DIAGNOSIS — R1310 Dysphagia, unspecified: Secondary | ICD-10-CM | POA: Diagnosis present

## 2022-02-27 DIAGNOSIS — R2689 Other abnormalities of gait and mobility: Secondary | ICD-10-CM | POA: Diagnosis present

## 2022-02-27 IMAGING — MR MR HEAD WO/W CM
19 of 22 series · 38 of 48 positions shown · IV contrast (gadavist)
Comparison: Cervical spine MRI [DATE].

CLINICAL DATA: 35-year-old female with right side weakness. Off
balance. Query demyelinating disease.

EXAM:
MRI HEAD WITHOUT AND WITH CONTRAST
TECHNIQUE: Multiplanar, multiecho pulse sequences of the brain and surrounding
structures were obtained without and with intravenous contrast.
CONTRAST:  10mL GADAVIST GADOBUTROL 1 MMOL/ML IV SOLN

[Series 5: ax dwi_tracew · axial · 3.0mm · 0.65mm/px · z∈[-123,+36]mm · 2 of 50 slices shown]
[im 1/50]
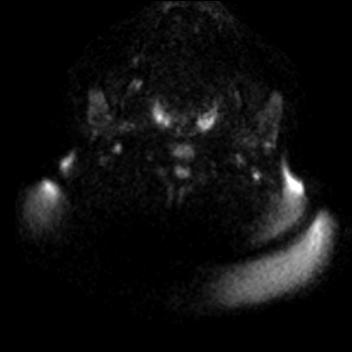
[im 50/50]
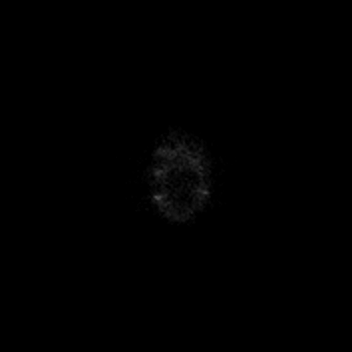

[Series 6: ax dwi_adc · axial · 3.0mm · 0.65mm/px · z∈[-123,+36]mm · 2 of 50 slices shown]
[im 1/50]
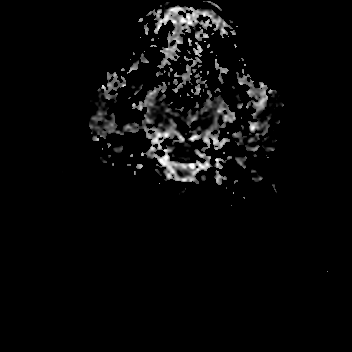
[im 50/50]
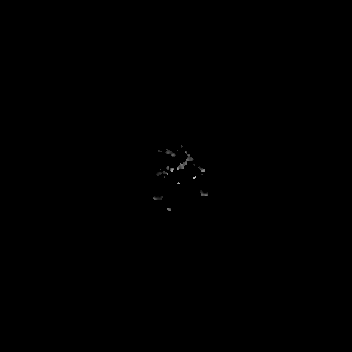

[Series 7: cor dwi_tracew · coronal · 5.0mm · 0.60mm/px · 2 of 42 slices shown]
[im 1/42]
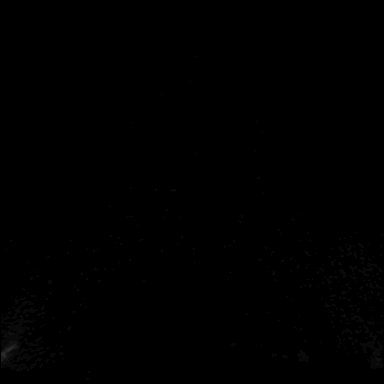
[im 42/42]
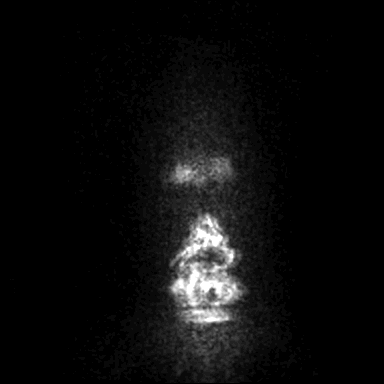

[Series 8: cor dwi_adc · coronal · 5.0mm · 0.60mm/px · 1 of 40 slices shown]
[im 1/40]
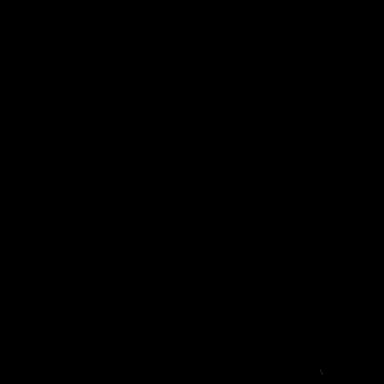

[Series 9: T1 · sagittal · 5.0mm · 0.62mm/px · 1 of 23 slices shown (1 of 4)]
[im 1/23]
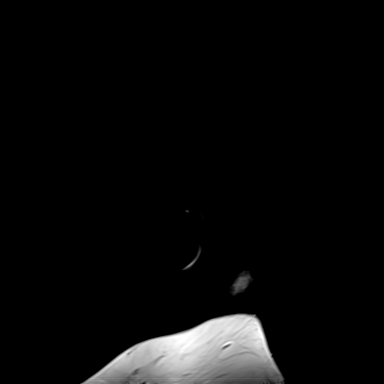

[Series 10: T2 · axial · 5.0mm · 0.53mm/px · 1 of 27 slices shown (1 of 3)]
[im 1/27]
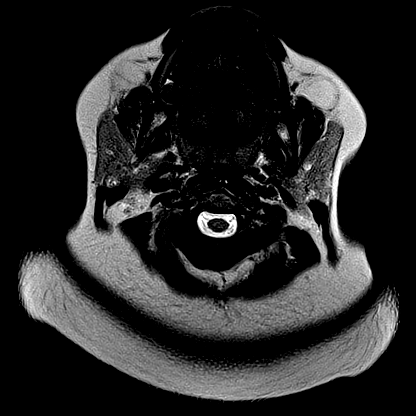

[Series 15: FLAIR · axial · 3.0mm · 0.53mm/px · z∈[-122,+37]mm · 2 of 55 slices shown (1 of 2)]
[im 1/55]
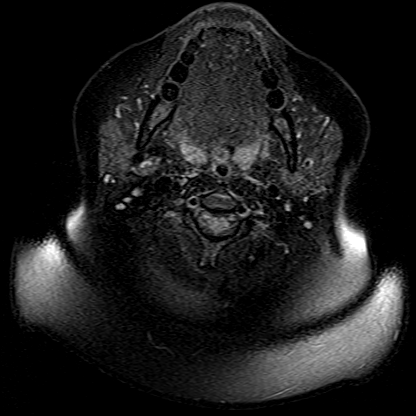
[im 55/55]
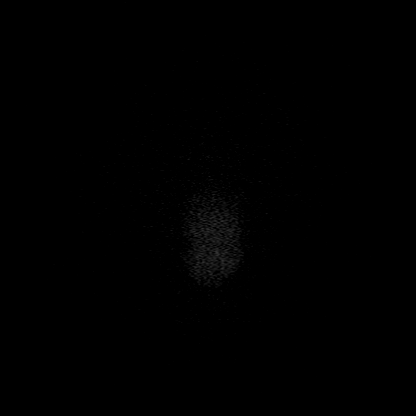

[Series 16: T1 · axial · 1.0mm · 0.98mm/px · z∈[-125,+47]mm · 7 of 176 slices shown (2 of 4)]
[im 1/176]
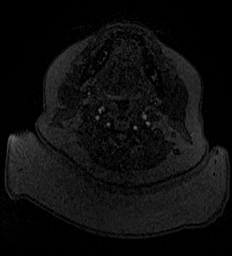
[im 30/176]
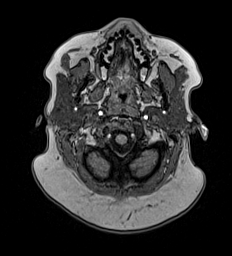
[im 59/176]
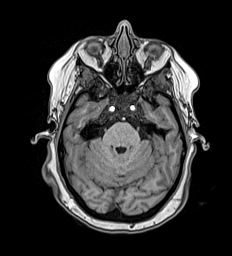
[im 88/176]
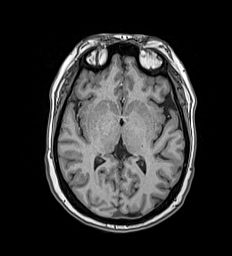
[im 117/176]
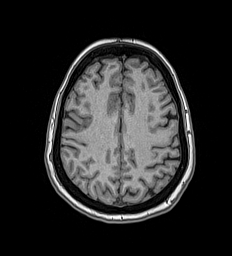
[im 146/176]
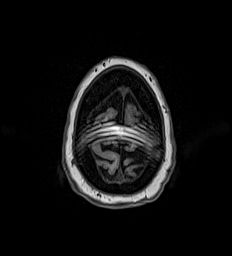
[im 176/176]
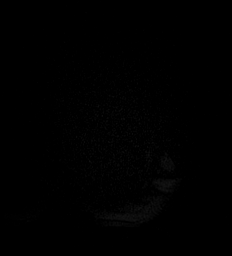

[Series 17: FLAIR · sagittal · 5.0mm · 0.94mm/px · 1 of 25 slices shown (2 of 2)]
[im 1/25]
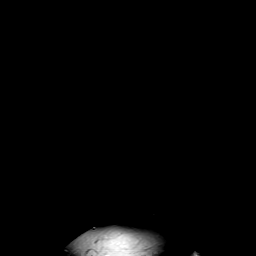

[Series 22: T2 · sagittal · 4.0mm · 0.81mm/px · 1 of 17 slices shown (2 of 3)]
[im 1/17]
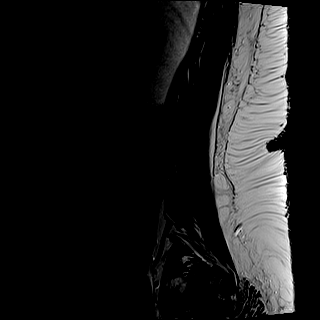

[Series 23: T1 · sagittal · 4.0mm · 0.81mm/px · 1 of 17 slices shown (3 of 4)]
[im 1/17]
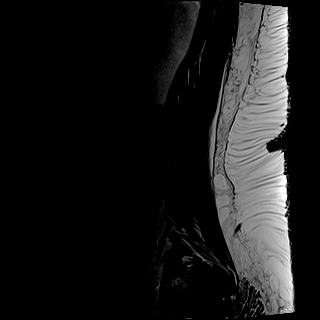

[Series 24: STIR · sagittal · 4.0mm · 0.41mm/px · 1 of 17 slices shown]
[im 1/17]
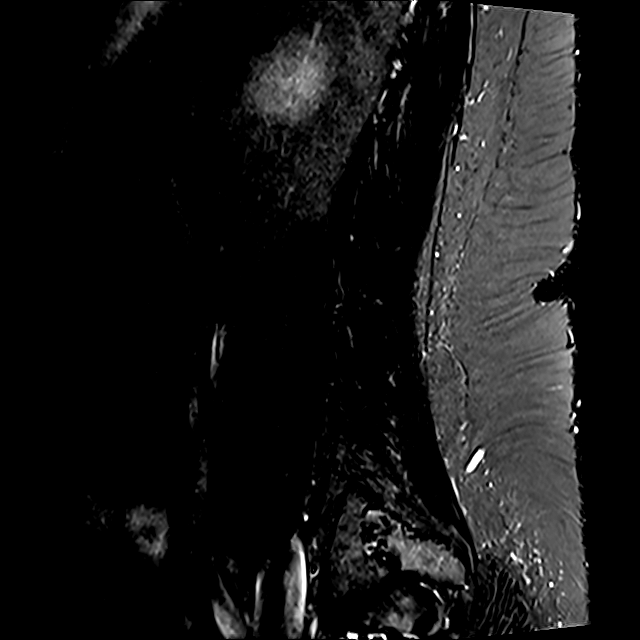

[Series 25: T2 · axial · 4.0mm · 0.78mm/px · z∈[-694,-479]mm · 2 of 36 slices shown (3 of 3)]
[im 1/36]
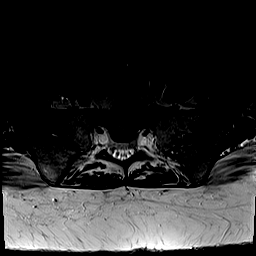
[im 36/36]
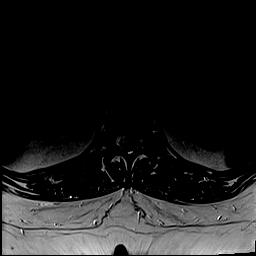

[Series 26: T1 · axial · 4.0mm · 0.39mm/px · z∈[-694,-479]mm · 2 of 36 slices shown (4 of 4)]
[im 1/36]
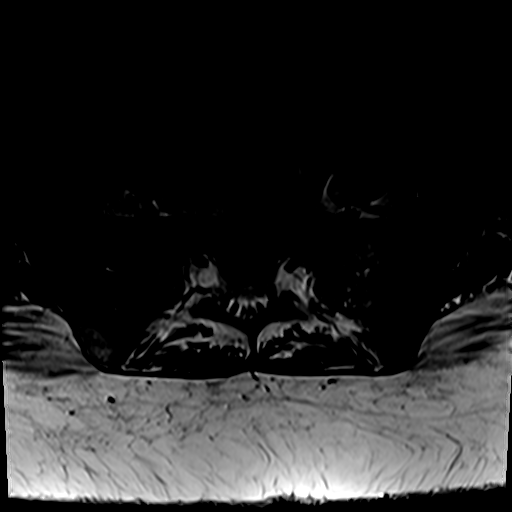
[im 36/36]
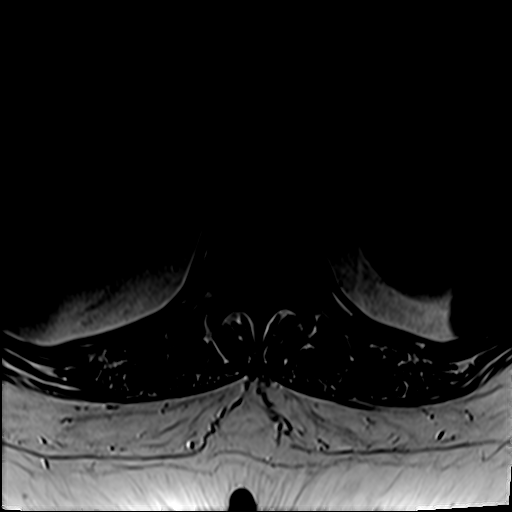

[Series 27: T1 fat-sat post-contrast · sagittal · 4.0mm · 0.81mm/px · 1 of 17 slices shown]
[im 1/17]
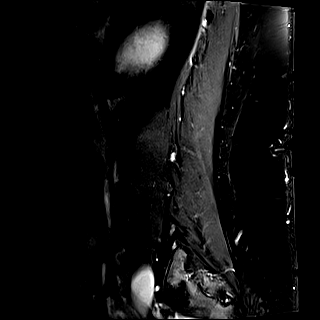

[Series 28: T1 post-contrast · axial · 4.0mm · 0.39mm/px · z∈[-694,-479]mm · 2 of 36 slices shown (1 of 3)]
[im 1/36]
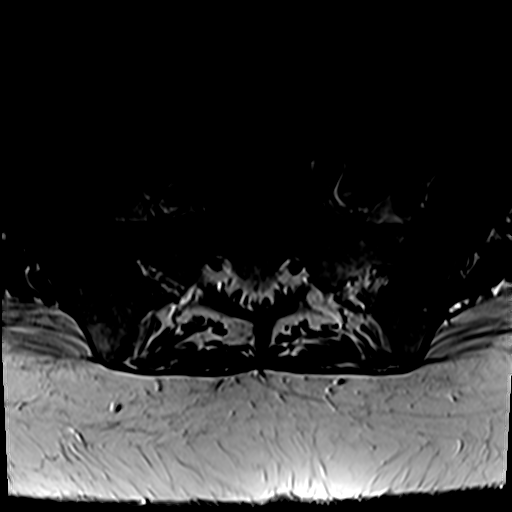
[im 36/36]
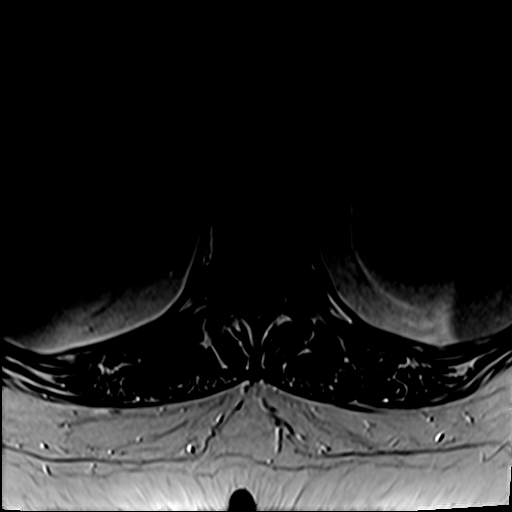

[Series 29: T2 post-contrast · coronal · 5.0mm · 0.57mm/px · 1 of 29 slices shown]
[im 1/29]
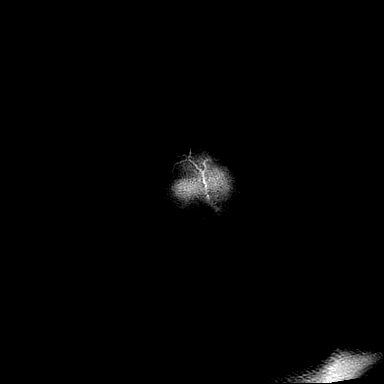

[Series 30: T1 post-contrast · axial · 1.0mm · 0.98mm/px · z∈[-125,+47]mm · 7 of 176 slices shown (2 of 3)]
[im 1/176]
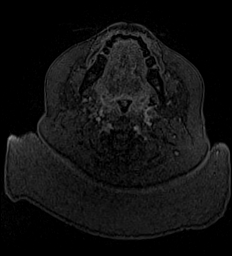
[im 30/176]
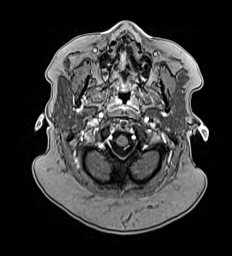
[im 59/176]
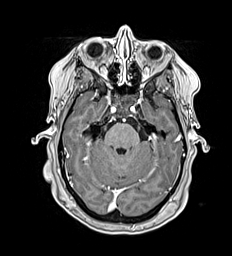
[im 88/176]
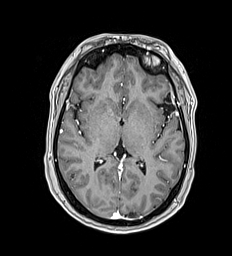
[im 117/176]
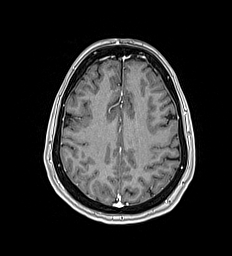
[im 146/176]
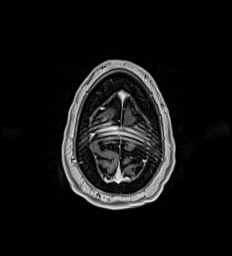
[im 176/176]
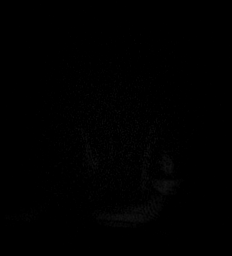

[Series 31: T1 post-contrast · coronal · 5.0mm · 0.57mm/px · 1 of 29 slices shown (3 of 3)]
[im 1/29]
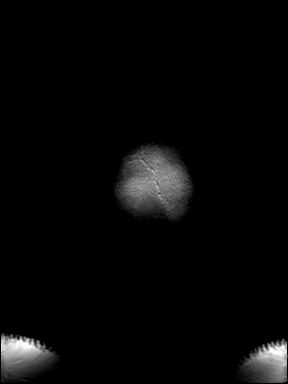

[38 of 48 positions shown; findings below may reference images not displayed]

FINDINGS: Brain: Cerebral volume is within normal limits. No restricted
diffusion to suggest acute infarction. No midline shift, mass
effect, evidence of mass lesion, ventriculomegaly, extra-axial
collection or acute intracranial hemorrhage. Cervicomedullary
junction and pituitary are within normal limits.

Gray and white matter signal is within normal limits for age
throughout the brain, with minimal nonspecific mostly subcortical
white matter T2 and FLAIR hyperintense foci (such as series 15,
image 33 in the anterior left frontal lobe). No cortical
encephalomalacia or chronic cerebral blood products identified.

No abnormal enhancement identified.  No dural thickening.

Vascular: Major intracranial vascular flow voids are preserved. The
major dural venous sinuses are enhancing and appear to be patent.

Skull and upper cervical spine: Negative visible cervical spine.
Hyperostosis of the calvarium, normal variant.

Sinuses/Orbits: Orbits appear symmetric and negative. Paranasal
sinuses and mastoids are well aerated.

Other: Visible internal auditory structures appear normal. Negative
visible scalp and face.
IMPRESSION: MRI appearance of the brain is within normal limits for age. No
strong evidence of demyelinating disease.

## 2022-02-27 IMAGING — MR MR LUMBAR SPINE WO/W CM
6 of 7 series · 31 of 48 positions shown · IV contrast (gadavist)
Comparison: Cervical spine MRI [DATE]. CT Abdomen and Pelvis
[DATE].

CLINICAL DATA: 35-year-old female with right side weakness. Off
balance. Query demyelinating disease.

EXAM:
MRI LUMBAR SPINE WITHOUT AND WITH CONTRAST
TECHNIQUE: Multiplanar and multiecho pulse sequences of the lumbar spine were
obtained without and with intravenous contrast.
CONTRAST:  10mL GADAVIST GADOBUTROL 1 MMOL/ML IV SOLN

[Series 22: T2 · sagittal · 4.0mm · 0.81mm/px · 4 of 17 slices shown (1 of 2)]
[im 1/17]
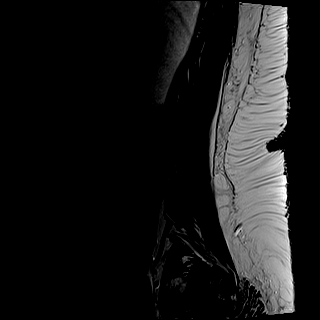
[im 6/17]
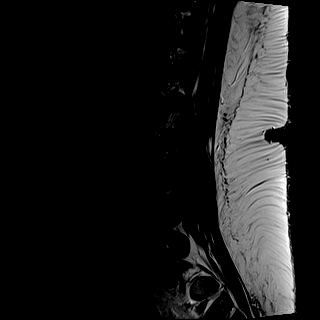
[im 11/17]
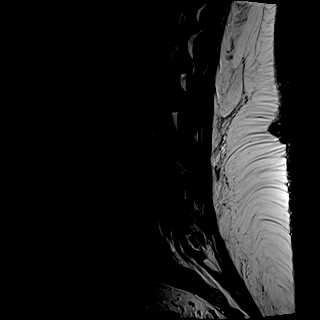
[im 17/17]
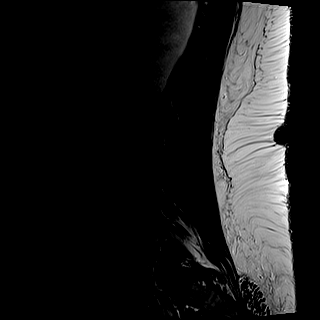

[Series 23: T1 · sagittal · 4.0mm · 0.81mm/px · 4 of 17 slices shown (1 of 2)]
[im 1/17]
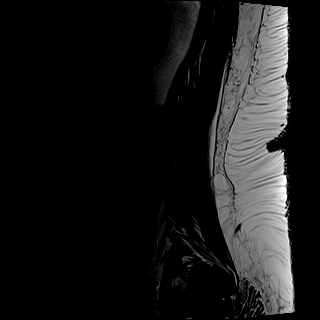
[im 6/17]
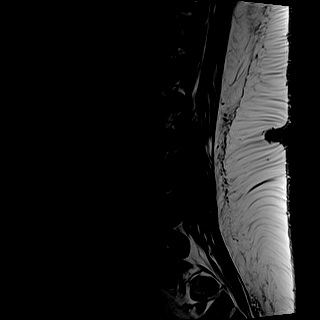
[im 11/17]
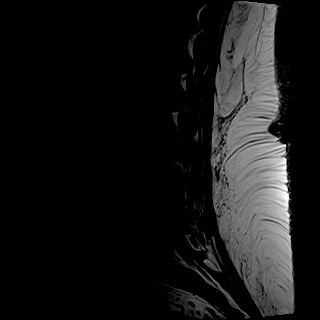
[im 17/17]
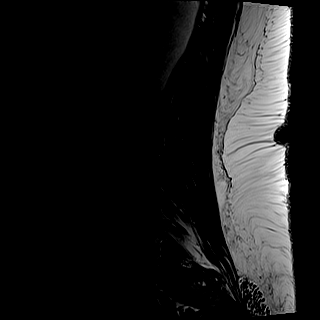

[Series 24: STIR · sagittal · 4.0mm · 0.41mm/px · 2 of 17 slices shown]
[im 1/17]
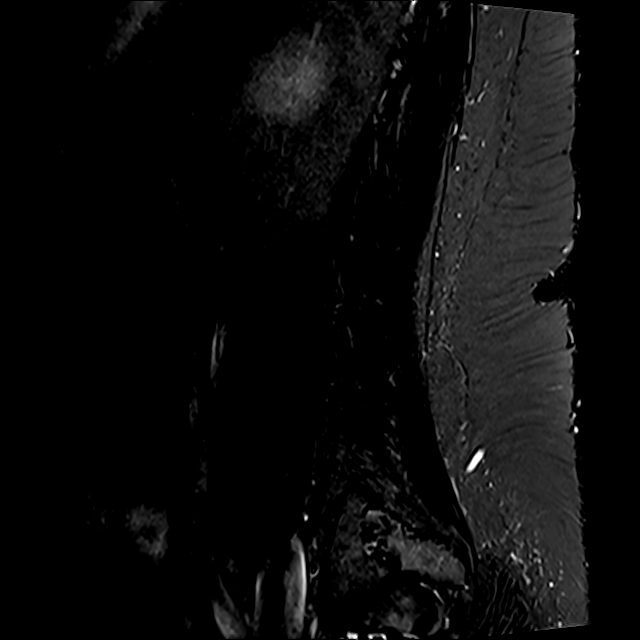
[im 5/17]
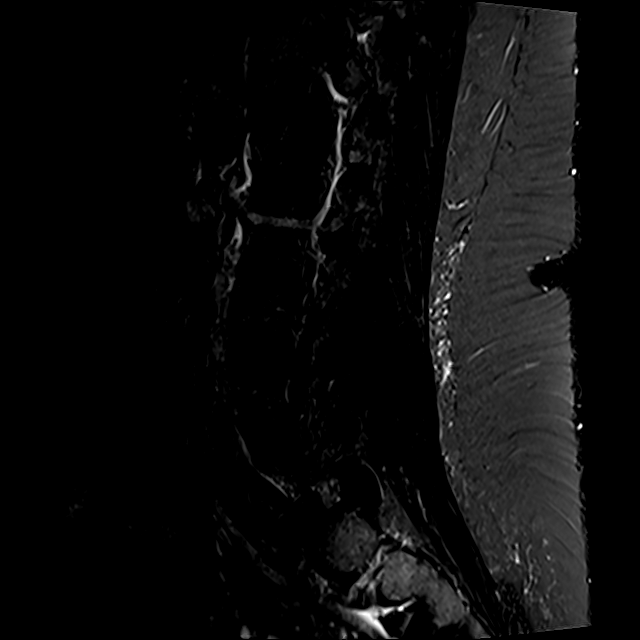

[Series 25: T2 · axial · 4.0mm · 0.78mm/px · z∈[-694,-479]mm · 8 of 36 slices shown (2 of 2)]
[im 1/36]
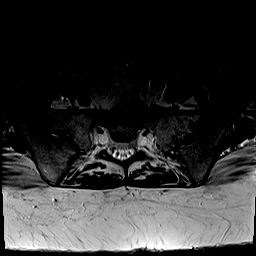
[im 4/36]
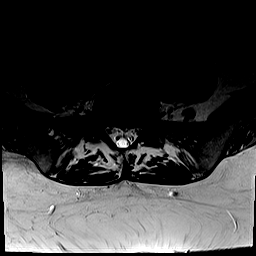
[im 12/36]
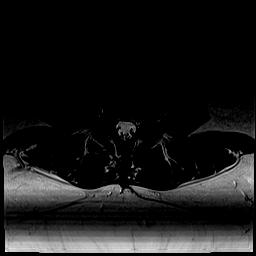
[im 16/36]
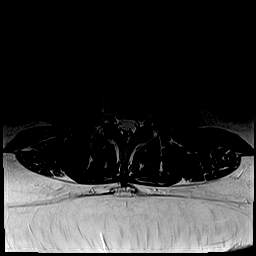
[im 20/36]
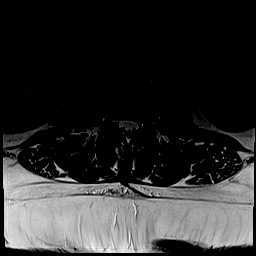
[im 24/36]
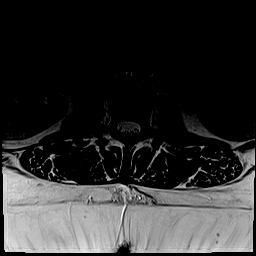
[im 32/36]
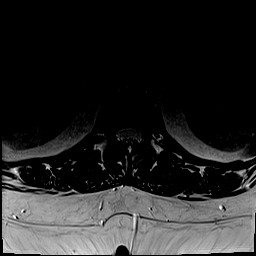
[im 36/36]
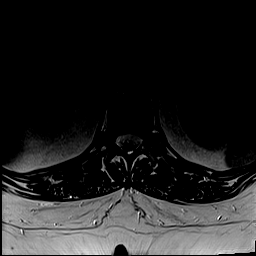

[Series 26: T1 · axial · 4.0mm · 0.39mm/px · z∈[-694,-479]mm · 8 of 36 slices shown (2 of 2)]
[im 1/36]
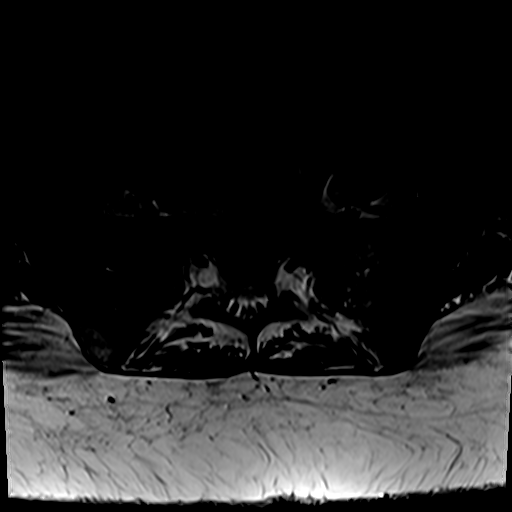
[im 4/36]
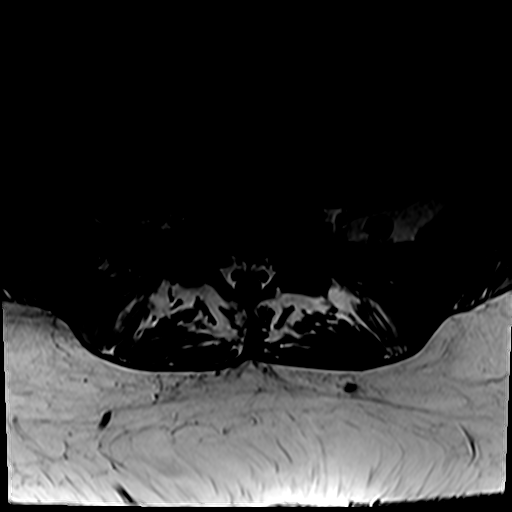
[im 12/36]
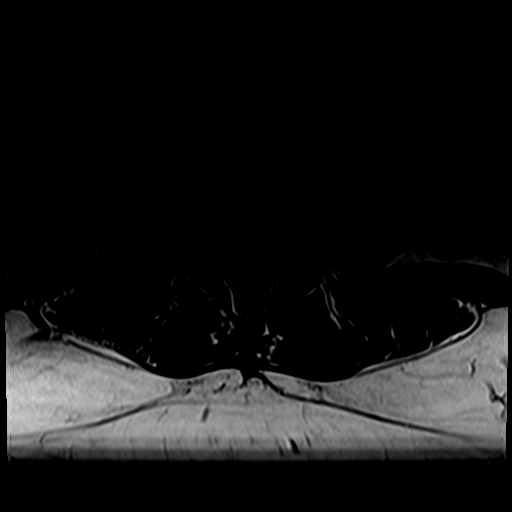
[im 16/36]
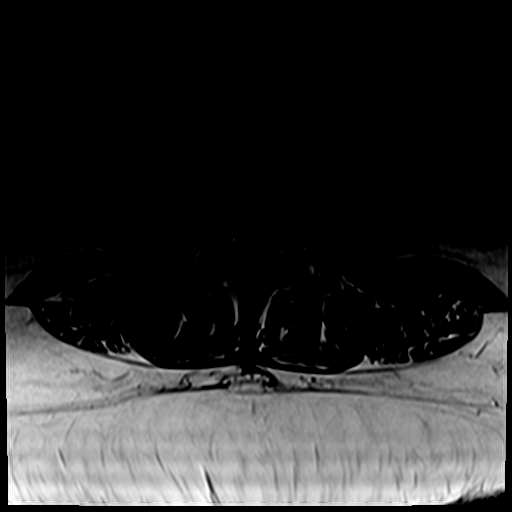
[im 20/36]
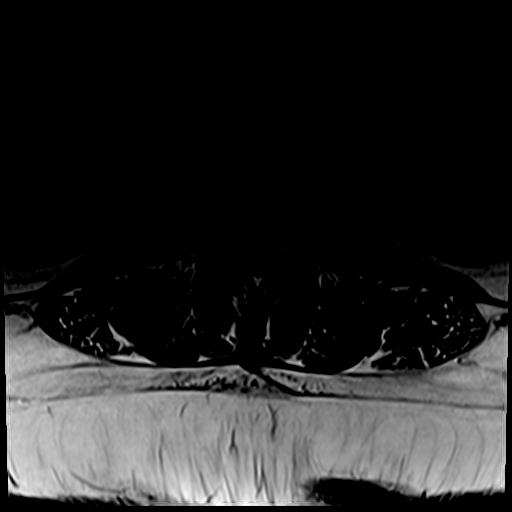
[im 24/36]
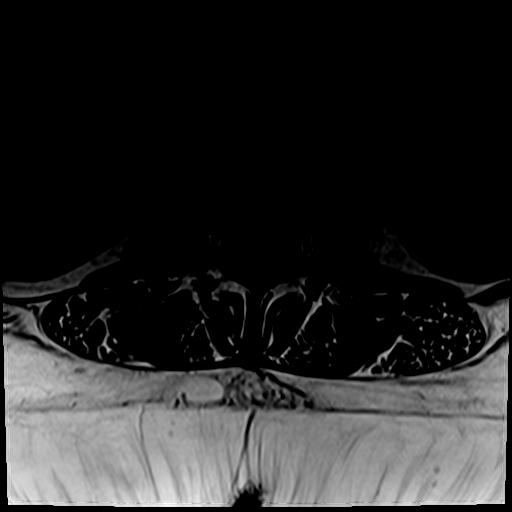
[im 32/36]
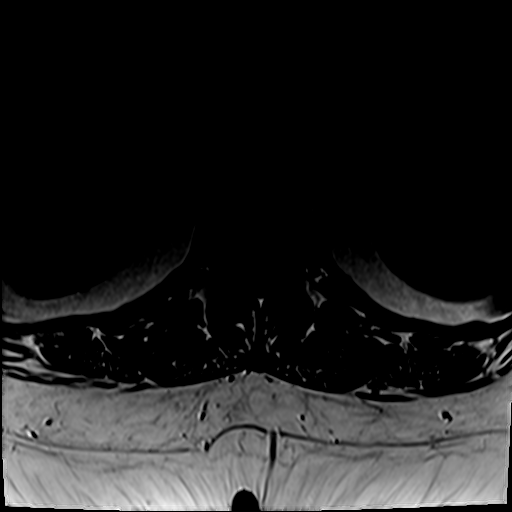
[im 36/36]
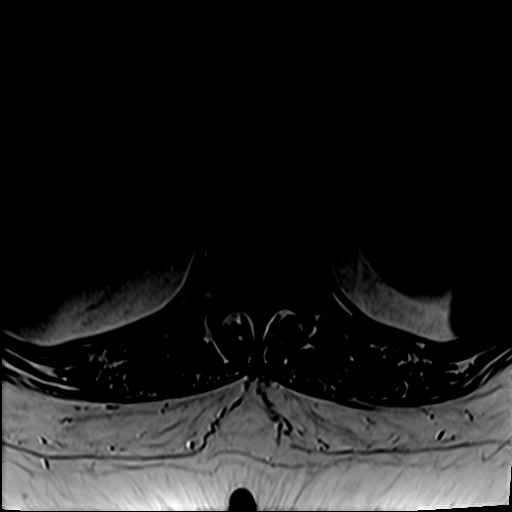

[Series 27: T1 fat-sat post-contrast · sagittal · 4.0mm · 0.81mm/px · 5 of 17 slices shown]
[im 1/17]
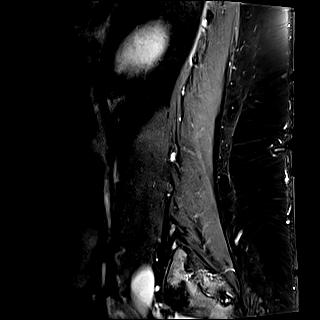
[im 5/17]
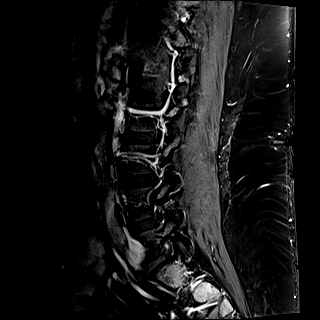
[im 9/17]
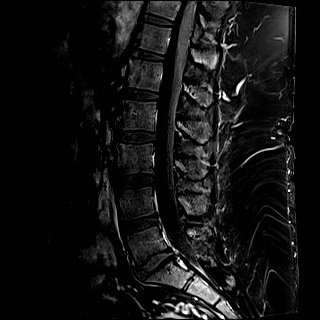
[im 13/17]
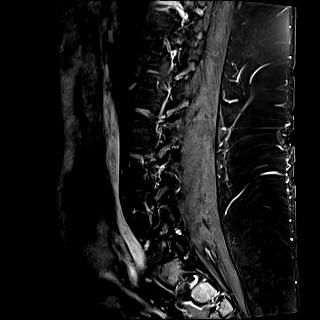
[im 17/17]
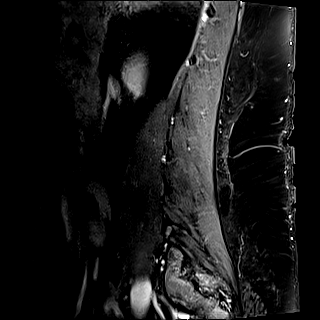

[31 of 48 positions shown; findings below may reference images not displayed]

FINDINGS: Segmentation:  Normal on the prior CT.

Alignment:  Stable and normal lumbar lordosis.

Vertebrae: No marrow edema or evidence of acute osseous abnormality.
Mild chronic degenerative endplate marrow signal changes at L5-S1
eccentric to the left. Background bone marrow signal is within
normal limits. Intact visible sacrum and SI joints.

Conus medullaris and cauda equina: Conus extends to the T12-L1
level. No lower spinal cord or conus signal abnormality. Fairly
capacious spinal canal. Normal cauda equina nerve roots. No abnormal
intradural enhancement or dural thickening.

Paraspinal and other soft tissues: Negative.

Disc levels:

Normal intervertebral disc signal and morphology from the visible
T11-T12 level through L3-L4. Up to mild facet hypertrophy at those
levels but no spinal stenosis or convincing neural impingement.

L4-L5: Mild disc desiccation. Small central disc protrusion (series
25, image 28). Mild facet hypertrophy. Capacious spinal canal. No
stenosis.

L5-S1: Mild disc desiccation, circumferential disc bulge, and small
left paracentral disc protrusion (series 25 image 32). Disc material
in proximity to the descending S1 nerve roots in the left lateral
recess, but no spinal or lateral recess stenosis. Mild left L5
foraminal stenosis primarily due to endplate spurring.
IMPRESSION: 1. Mild lumbar disc degeneration at L4-L5 and L5-S1 with small
central disc herniations at both levels. The latter might be a
source of S1 radiculitis.
2. Widespread mild lumbar facet hypertrophy. Capacious spinal canal
with no spinal stenosis. Mild left L5 neural foraminal stenosis.

## 2022-02-27 MED ORDER — GADOBUTROL 1 MMOL/ML IV SOLN
10.0000 mL | Freq: Once | INTRAVENOUS | Status: AC | PRN
  Administered 2022-02-27: 10 mL via INTRAVENOUS

## 2022-03-03 ENCOUNTER — Ambulatory Visit: Payer: No Typology Code available for payment source

## 2022-03-03 ENCOUNTER — Other Ambulatory Visit: Payer: No Typology Code available for payment source

## 2022-03-25 ENCOUNTER — Ambulatory Visit (INDEPENDENT_AMBULATORY_CARE_PROVIDER_SITE_OTHER): Payer: No Typology Code available for payment source | Admitting: Family Medicine

## 2022-03-25 ENCOUNTER — Encounter: Payer: Self-pay | Admitting: Family Medicine

## 2022-03-25 VITALS — BP 126/82 | HR 74 | Temp 98.2°F | Resp 16 | Ht 63.0 in | Wt 302.2 lb

## 2022-03-25 DIAGNOSIS — R5383 Other fatigue: Secondary | ICD-10-CM

## 2022-03-25 DIAGNOSIS — M25542 Pain in joints of left hand: Secondary | ICD-10-CM

## 2022-03-25 DIAGNOSIS — D649 Anemia, unspecified: Secondary | ICD-10-CM

## 2022-03-25 DIAGNOSIS — M25541 Pain in joints of right hand: Secondary | ICD-10-CM | POA: Diagnosis not present

## 2022-03-25 DIAGNOSIS — Z6841 Body Mass Index (BMI) 40.0 and over, adult: Secondary | ICD-10-CM

## 2022-03-25 DIAGNOSIS — R7303 Prediabetes: Secondary | ICD-10-CM

## 2022-03-25 DIAGNOSIS — E611 Iron deficiency: Secondary | ICD-10-CM

## 2022-03-25 NOTE — Progress Notes (Signed)
? ? ?Patient ID: Tanya Reeves, female    DOB: 12/07/1985, 36 y.o.   MRN: [id number] ? ?PCP: Danelle Berry, PA-C ? ?Chief Complaint  ?Patient presents with  ? Follow-up  ?  Generalized weakness, and joint pain all over, has seen specialist and has had several test/ labs preformed.  Has f/u with neuro today  ? ? ?Subjective:  ? ?Tanya Reeves is a 36 y.o. female, presents to clinic with CC of the following: ? ?HPI  ?Seeing neurology at [facility]/neurology ?Was previously referred to rheumatology for arthralgia and some mildly positive rheum labs/inflammatory markers- but she was developing new and concerning neuro sx so she got into [facility] neurology -  ?She is here for f/up just for coordination of care - no formal dx yet - neg MRI brain Cspine, EMG did lumbar spine with several findigns, she has some leg pain but mostly weakness - was referred to physiatry but hasn't gone yet. ?Balance and coordination issues - just walking and sx begin to fall - right leg weak sometimes tripping her ? ?She has no joint pain or severe neuropathy right now ?She has episodes of gradual onset of weakness, especially after days of activity ?She's had episodes of difficulty talking and walking ?She feels good right now ?Has f/up with neuro later today ?When having severe sx she has not passed out, has not checked CBG ?Neurology wanted to send her to the ED when she was symptomatic and in their office but she refused - had her MRI scheduled for 2 days later ?She took a trip to [place] and was very active the first couple days but by the end of the trip she was so weak she could barely walk or pull her suitcase, mentating slower, speech slowed  ? ?She previously saw Dr. Linwood Dibbles who mentioned trending labs ? ?Iron deficiency, she restarted her oral iron supplement, post bariatric surgery, prone to low iron, supplements are hard for her to do continuously ?Reviewed her iron/CBC with her ?Has been back on supplement for about 8  weeks ?Lab Results  ?Component Value Date  ? IRON 47 02/06/2022  ? TIBC 429 02/06/2022  ? FERRITIN 6 (L) 02/06/2022  ? ?Hemoglobin  ?Date Value Ref Range Status  ?02/06/2022 11.2 (L) 11.7 - 15.5 g/dL Final  ?18/84/1660 63.0 (L) 12.0 - 15.0 g/dL Final  ?16/11/930 35.5 12.0 - 15.0 g/dL Final  ?73/22/0254 27.0 11.7 - 15.5 g/dL Final  ?62/37/6283 15.1 11.1 - 15.9 g/dL Final  ? ? ? ? ? ?Patient Active Problem List  ? Diagnosis Date Noted  ? Sleep apnea 02/06/2022  ? Arthralgia of both hands 02/06/2022  ? Incisional hernia 10/17/2021  ? Incisional hernia with obstruction but no gangrene   ? History of small bowel obstruction 02/09/2021  ? Palpitations 02/09/2021  ? 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 Asthma 07/18/2017  ? Hidradenitis suppurativa 03/24/2017  ? Class 3 severe obesity with body mass index (BMI) of 50.0 to 59.9 in adult [facility]) 03/17/2016  ? Migraine without aura 02/26/2016  ? OSA on CPAP 02/26/2016  ? Gestational diabetes mellitus, antepartum 09/23/2014  ? Left ventricular hypertrophy 08/04/2014  ? Obesity affecting pregnancy 08/04/2014  ? Left ventricular hypertrophy 08/04/2014  ? Status post bariatric surgery 07/07/2014  ? Previous cesarean delivery affecting pregnancy 07/07/2014  ? ? ? ? ?Current Outpatient Medications:  ?  eletriptan (RELPAX) 20 MG tablet, TAKE 1 TABLET BY MOUTH AS NEEDED FOR  MIGRAINE OR HEADACHE. MAY REPEAT IN 2 HOURS IF HEADACHE PERSISTS OR RECURS, Disp: 6 tablet, Rfl: 5 ?  ferrous sulfate 325 (65 FE) MG tablet, Take by mouth., Disp: , Rfl:  ?  Multiple Vitamins-Minerals (BARIATRIC MULTIVITAMINS/IRON) CAPS, Take 1 capsule by mouth daily., Disp: , Rfl:  ?  Multiple Vitamins-Minerals (MULTI COMPLETE PO), Take by mouth., Disp: , Rfl:  ?  ondansetron (ZOFRAN ODT) 4 MG disintegrating tablet, Take 1 tablet (4 mg total) by mouth every 8 (eight) hours as needed for nausea or vomiting., Disp: 30 tablet, Rfl: 0 ?  albuterol  (PROAIR HFA) 108 (90 Base) MCG/ACT inhaler, Inhale 2 puffs into the lungs every 4 (four) hours as needed., Disp: 1 each, Rfl: 2 ? ? ?Allergies  ?Allergen Reactions  ? Cinnamon Swelling  ?  Tongue swells up. With artificial (such as in gum/red hots)  ? Dextromethorphan-Guaifenesin Other (See Comments)  ? Dm-Guaifenesin Er   ? Influenza Vaccines Hives  ?  Hives two years in a row.  ?Hives two years in a row.   ? ? ? ?Social History  ? ?Tobacco Use  ? Smoking status: Never  ? Smokeless tobacco: Never  ?Vaping Use  ? Vaping Use: Never used  ?Substance Use Topics  ? Alcohol use: Yes  ?  Alcohol/week: 0.0 standard drinks  ?  Comment: wine - 2 drinks per month  ? Drug use: No  ?  ? ? ?Chart Review Today: ?I personally reviewed active problem list, medication list, allergies, family history, social history, health maintenance, notes from last encounter, lab results, imaging with the patient/caregiver today. ? ? ?Review of Systems  ?Constitutional: Negative.   ?HENT: Negative.    ?Eyes: Negative.   ?Respiratory: Negative.    ?Cardiovascular: Negative.   ?Gastrointestinal: Negative.   ?Endocrine: Negative.   ?Genitourinary: Negative.   ?Musculoskeletal: Negative.   ?Skin: Negative.   ?Allergic/Immunologic: Negative.   ?Neurological: Negative.   ?Hematological: Negative.   ?Psychiatric/Behavioral: Negative.    ?All other systems reviewed and are negative. ? ?   ?Objective:  ? ?Vitals:  ? 03/25/22 0933  ?BP: 126/82  ?Pulse: 74  ?Resp: 16  ?Temp: 98.2 ?F (36.8 ?C)  ?SpO2: 99%  ?Weight: (!) 302 lb 3.2 oz (137.1 kg)  ?Height: 5\' 3"  (1.6 m)  ?  ?Body mass index is 53.53 kg/m?. ? ?Physical Exam ?Vitals and nursing note reviewed.  ?Constitutional:   ?   General: She is not in acute distress. ?   Appearance: Normal appearance. She is normal weight. She is not ill-appearing, toxic-appearing or diaphoretic.  ?HENT:  ?   Head: Normocephalic and atraumatic.  ?   Right Ear: External ear normal.  ?   Left Ear: External ear normal.  ?    Nose: Nose normal.  ?Eyes:  ?   General:     ?   Right eye: No discharge.     ?   Left eye: No discharge.  ?   Conjunctiva/sclera: Conjunctivae normal.  ?Cardiovascular:  ?   Rate and Rhythm: Normal rate and regular rhythm.  ?   Pulses: Normal pulses.  ?   Heart sounds: Normal heart sounds.  ?Pulmonary:  ?   Effort: Pulmonary effort is normal.  ?   Breath sounds: Normal breath sounds.  ?Skin: ?   General: Skin is dry.  ?   Coloration: Skin is not jaundiced or pale.  ?Psychiatric:     ?   Mood and Affect: Mood normal.     ?  Behavior: Behavior normal.  ?  ? ?Results for orders placed or performed in visit on 02/06/22  ?Hemoglobin A1c  ?Result Value Ref Range  ? Hgb A1c MFr Bld 5.9 (H) <5.7 % of total Hgb  ? Mean Plasma Glucose 123 mg/dL  ? eAG (mmol/L) 6.8 mmol/L  ?Comprehensive metabolic panel  ?Result Value Ref Range  ? Glucose, Bld 85 65 - 99 mg/dL  ? BUN 14 7 - 25 mg/dL  ? Creat 0.47 (L) 0.50 - 0.97 mg/dL  ? BUN/Creatinine Ratio 30 (H) 6 - 22 (calc)  ? Sodium 139 135 - 146 mmol/L  ? Potassium 4.3 3.5 - 5.3 mmol/L  ? Chloride 104 98 - 110 mmol/L  ? CO2 25 20 - 32 mmol/L  ? Calcium 9.2 8.6 - 10.2 mg/dL  ? Total Protein 7.2 6.1 - 8.1 g/dL  ? Albumin 4.2 3.6 - 5.1 g/dL  ? Globulin 3.0 1.9 - 3.7 g/dL (calc)  ? AG Ratio 1.4 1.0 - 2.5 (calc)  ? Total Bilirubin 0.2 0.2 - 1.2 mg/dL  ? Alkaline phosphatase (APISO) 62 31 - 125 U/L  ? AST 9 (L) 10 - 30 U/L  ? ALT 13 6 - 29 U/L  ?B12  ?Result Value Ref Range  ? Vitamin B-12 1,619 (H) 200 - 1,100 pg/mL  ?Vitamin E  ?Result Value Ref Range  ? Vitamin E (Alpha Tocopherol) 11.9 5.7 - 19.9 mg/L  ? Gamma-Tocopherol (Vit E) 1.0 <=4.3 mg/L  ?Vitamin A  ?Result Value Ref Range  ? Vitamin A (Retinoic Acid) 48 38 - 98 mcg/dL  ?Vitamin K1, Serum  ?Result Value Ref Range  ? Vitamin K 1,080 130 - 1,500 pg/mL  ?Fe+TIBC+Fer  ?Result Value Ref Range  ? Iron 47 40 - 190 mcg/dL  ? TIBC 429 250 - 450 mcg/dL (calc)  ? %SAT 11 (L) 16 - 45 % (calc)  ? Ferritin 6 (L) 16 - 154 ng/mL  ?Zinc  ?Result  Value Ref Range  ? Zinc 75 60 - 130 mcg/dL  ?ANA,IFA RA Diag Pnl w/rflx Tit/Patn  ?Result Value Ref Range  ? Anti Nuclear Antibody (ANA) POSITIVE (A) NEGATIVE  ? Rhuematoid fact SerPl-aCnc <14 <14 IU/mL  ? Cyclic Citr

## 2022-03-26 ENCOUNTER — Encounter: Payer: Self-pay | Admitting: Family Medicine

## 2022-12-11 ENCOUNTER — Encounter: Payer: Self-pay | Admitting: Family Medicine

## 2022-12-30 NOTE — Progress Notes (Signed)
Name: Tanya Reeves   MRN: [id number]    DOB: Mar 25, 1986   Date:12/31/2022       Progress Note  Chief Complaint  Patient presents with   Follow-up   Asthma   Migraine     Subjective:   Tanya Reeves is a 37 y.o. female, presents to clinic for routine f/up  Hx of obesity s/p bariatric surgery, with deficiencies, hx of SBO - she brings lab orders with her from the specialist - usually done annually IDA - she is on oral iron supplementation  Anemia in the past 1-2 years Lab Results  Component Value Date   IRON 47 02/06/2022   TIBC 429 02/06/2022   FERRITIN 6 (L) 02/06/2022   Hemoglobin  Date Value Ref Range Status  02/06/2022 11.2 (L) 11.7 - 15.5 g/dL Final  10/18/2021 11.6 (L) 12.0 - 15.0 g/dL Final  10/04/2021 13.5 12.0 - 15.0 g/dL Final  02/09/2021 11.7 11.7 - 15.5 g/dL Final  02/26/2016 12.7 11.1 - 15.9 g/dL Final   HGB  Date Value Ref Range Status  12/10/2014 12.2 12.0 - 16.0 g/dL Final  04/10/2013 12.4 12.0 - 16.0 g/dL Final   Migraines - managed on relpax and uses zofran as needed for associated nausea, she recently ran out of meds and missed two days of   Asthma - only has sx with URI/virus - only time she uses albuterol rescue inhaler   OSA on CPAP- good compliance no concerns, she sleeps well  Neuropathy/balance gait/neuro condition working with   Hx of DM which improved after weight loss, monitoring prediabetes and elevated cholesterol not requiring statin   Extensive chart review today Neurology, rheumatology and weight/bariatric  [facility]: Last f/up virtually completed 11/27/2022:  Tanya Reeves is a 37 y.o. female with a history of SADI from 11/23/2018. her starting weight was 367 pounds with a corresponding BMI of 58.99. she is currently 310 pounds with a Body mass index is 54.91 kg/m.Marland Kitchen The relevant past medical history includes: has a past medical history of Asthma, Cholelithiasis, CPAP  (continuous positive airway pressure) dependence, Diabetes mellitus (CMS/HCC), Fatty liver, GERD (gastroesophageal reflux disease), mammogram, pregnancy, Hypertension, Immunizations up to date, and Sleep apnea.   she is 4 years from surgery. Pt is reporting excessive skin that is interfering with many aspects of her life. The amount of skin is drastic and hits the toilet water when voiding. She has seen multiple surgeons that will not agree to operate.   Pt seeing some weight regain. Nadir 297, now 310lb. Get labs through pcp - needs to be updated to do this. Try for GLP-1 therapy. RD appt encouraged.   Pt did not report RD consult or starting GLP-1   Current Outpatient Medications:    eletriptan (RELPAX) 20 MG tablet, TAKE 1 TABLET BY MOUTH AS NEEDED FOR MIGRAINE OR HEADACHE. MAY REPEAT IN 2 HOURS IF HEADACHE PERSISTS OR RECURS, Disp: 6 tablet, Rfl: 5   ferrous sulfate 325 (65 FE) MG tablet, Take by mouth., Disp: , Rfl:    Multiple Vitamins-Minerals (BARIATRIC MULTIVITAMINS/IRON) CAPS, Take 1 capsule by mouth daily., Disp: , Rfl:    ondansetron (ZOFRAN ODT) 4 MG disintegrating tablet, Take 1 tablet (4 mg total) by mouth every 8 (eight) hours as needed for nausea or vomiting., Disp: 30 tablet, Rfl: 0   albuterol (PROAIR HFA) 108 (90 Base) MCG/ACT inhaler, Inhale 2 puffs into the lungs every 4 (four) hours as needed., Disp: 1 each,  Rfl: 2   Multiple Vitamins-Minerals (MULTI COMPLETE PO), Take by mouth. 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   Asthma 07/18/2017   Hidradenitis suppurativa 03/24/2017    Class 3 severe obesity with body mass index (BMI) of 50.0 to 59.9 in adult ([place]) 03/17/2016   Migraine without aura 02/26/2016   OSA on CPAP 02/26/2016   Gestational diabetes mellitus, antepartum 09/23/2014   Left ventricular hypertrophy 08/04/2014   Obesity affecting pregnancy 08/04/2014   Left ventricular hypertrophy 08/04/2014   Status post bariatric surgery 07/07/2014   Previous cesarean delivery affecting pregnancy 07/07/2014    Past Surgical History:  Procedure Laterality Date   CESAREAN SECTION     2   CHOLECYSTECTOMY     COLON SURGERY     part of bowel removed due to c-section   INSERTION OF MESH  10/17/2021   Procedure: INSERTION OF MESH;  Surgeon: Olean Ree, MD;  Location: [facility] ORS;  Service: General;;   sips     STOMACH SURGERY     XI ROBOTIC ASSISTED VENTRAL HERNIA N/A 10/17/2021   Procedure: XI ROBOTIC ASSISTED VENTRAL HERNIA, incisional;  Surgeon: Olean Ree, MD;  Location: [facility] ORS;  Service: General;  Laterality: N/A;  Incisional hernia    Family History  Problem Relation Age of Onset   Diabetes Mother    Cancer Mother        femal organs   Hypoparathyroidism Mother    Asthma Mother    Diabetes Father    Juvenile idiopathic arthritis Daughter    Diabetes Maternal Grandmother    Diabetes Maternal Grandfather    Leukemia Paternal Grandmother    Aneurysm Paternal Grandfather     Social History   Tobacco Use   Smoking status: Never   Smokeless tobacco: Never  Vaping Use   Vaping Use: Never used  Substance Use Topics   Alcohol use: Yes    Alcohol/week: 0.0 standard drinks of alcohol    Comment: wine - 2 drinks per month   Drug use: No     Allergies  Allergen Reactions   Cinnamon Swelling    Tongue swells up. With artificial (such as in gum/red hots)   Dextromethorphan-Guaifenesin Other (See Comments)   Dm-Guaifenesin Er    Influenza Vaccines Hives    Hives two years in a row.  Hives two years in a row.     Health Maintenance  Topic  Date Due   PAP SMEAR-Modifier  07/13/2021   DTaP/Tdap/Td (2 - Td or Tdap) 10/24/2024   Hepatitis C Screening  Completed   HIV Screening  Completed   HPV VACCINES  Aged Out   INFLUENZA VACCINE  Discontinued   COVID-19 Vaccine  Discontinued    Chart Review Today: I personally reviewed active problem list, medication list, allergies, family history, social history, health maintenance, notes from last encounter, lab results, imaging with the patient/caregiver today.   Review of Systems  Constitutional: Negative.   HENT: Negative.    Eyes: Negative.   Respiratory: Negative.    Cardiovascular: Negative.   Gastrointestinal: Negative.   Endocrine: Negative.   Genitourinary: Negative.  Musculoskeletal: Negative.   Skin: Negative.   Allergic/Immunologic: Negative.   Neurological: Negative.   Hematological: Negative.   Psychiatric/Behavioral: Negative.    All other systems reviewed and are negative.    Objective:   Vitals:   12/31/22 0820  BP: 128/84  Pulse: 83  Resp: 16  Temp: 97.8 F (36.6 C)  TempSrc: Oral  SpO2: 98%  Weight: (!) 315 lb 14.4 oz (143.3 kg)  Height: 5' 3"$  (1.6 m)    Body mass index is 55.96 kg/m.  Physical Exam Vitals and nursing note reviewed.  Constitutional:      General: She is not in acute distress.    Appearance: She is obese. She is not ill-appearing, toxic-appearing or diaphoretic.  HENT:     Head: Normocephalic and atraumatic.     Right Ear: External ear normal.     Left Ear: External ear normal.  Cardiovascular:     Rate and Rhythm: Normal rate and regular rhythm.     Pulses: Normal pulses.     Heart sounds: Normal heart sounds.  Pulmonary:     Effort: Pulmonary effort is normal. No respiratory distress.     Breath sounds: Normal breath sounds. No stridor. No wheezing or rales.  Skin:    Coloration: Skin is not jaundiced or pale.     Findings: No bruising or erythema.  Neurological:     Mental Status: She is alert. Mental status  is at baseline.  Psychiatric:        Mood and Affect: Mood normal.        Behavior: Behavior normal.         Assessment & Plan:   Problem List Items Addressed This Visit       Cardiovascular and Mediastinum   Migraine without aura    Once every couple of months, seem to occur with mentrual cycles Stress and food sometimes a trigger      Relevant Medications   eletriptan (RELPAX) 20 MG tablet   ondansetron (ZOFRAN ODT) 4 MG disintegrating tablet     Respiratory   Asthma - Primary (Chronic)    Well controlled, mild intermittent Uses inhaler *(albuterol rescue) usually only with acute illnesses No recent exacerbations requiring steroids      Relevant Medications   albuterol (PROAIR HFA) 108 (90 Base) MCG/ACT inhaler   OSA on CPAP    Good compliance, she feels well rested        Other   Class 3 severe obesity with body mass index (BMI) of 50.0 to 59.9 in adult (HCC)    Weight increased a little in the past year (+13 lbs), she continues to work with bariatric specialists  Wt Readings from Last 5 Encounters:  12/31/22 (!) 315 lb 14.4 oz (143.3 kg)  03/25/22 (!) 302 lb 3.2 oz (137.1 kg)  02/06/22 (!) 301 lb 9.6 oz (136.8 kg)  11/02/21 297 lb 9.6 oz (135 kg)  10/17/21 (!) 301 lb 9.4 oz (136.8 kg)   BMI Readings from Last 5 Encounters:  12/31/22 55.96 kg/m  03/25/22 53.53 kg/m  02/06/22 53.43 kg/m  11/02/21 52.72 kg/m  10/17/21 53.42 kg/m  Last f/up per specialists recommended RD and GLP-1 Agree with these recommendations      Relevant Orders   COMPLETE METABOLIC PANEL WITH GFR (Completed)   Lipid panel (Completed)   Hemoglobin A1c (Completed)   Status post bariatric surgery    Labs being completed here per pt request - ordered and will be managed by her specialists  Relevant Orders   Vitamin D (25 hydroxy) (Completed)   Vitamin B1   B12 and Folate Panel (Completed)   Fe+TIBC+Fer (Completed)   CBC with Differential/Platelet (Completed)    COMPLETE METABOLIC PANEL WITH GFR (Completed)   Vitamin A   Vitamin E   Vitamin K1, Serum (Completed)   Zinc   Copper, serum   PTH, Intact and Calcium (Completed)   Lipid panel (Completed)   Hemoglobin A1c (Completed)   Selenium serum (Completed)   Status post biliopancreatic diversion with duodenal switch   Relevant Orders   Vitamin D (25 hydroxy) (Completed)   Vitamin B1   B12 and Folate Panel (Completed)   Fe+TIBC+Fer (Completed)   CBC with Differential/Platelet (Completed)   COMPLETE METABOLIC PANEL WITH GFR (Completed)   Vitamin A   Vitamin E   Vitamin K1, Serum (Completed)   Zinc   Copper, serum   PTH, Intact and Calcium (Completed)   Lipid panel (Completed)   Hemoglobin A1c (Completed)   Selenium serum (Completed)   Other Visit Diagnoses     Anemia, unspecified type       anemia the past 2 years, recheck CBC and iron panel today, she is on oral supplement daily   Relevant Orders   Vitamin B1   B12 and Folate Panel (Completed)   Fe+TIBC+Fer (Completed)   CBC with Differential/Platelet (Completed)   Mild intermittent asthma with exacerbation       Relevant Medications   albuterol (PROAIR HFA) 108 (90 Base) MCG/ACT inhaler   Other specified intestinal malabsorption       Relevant Orders   Vitamin D (25 hydroxy) (Completed)   Vitamin B1   B12 and Folate Panel (Completed)   Fe+TIBC+Fer (Completed)   CBC with Differential/Platelet (Completed)   COMPLETE METABOLIC PANEL WITH GFR (Completed)   Vitamin A   Vitamin E   Vitamin K1, Serum (Completed)   Zinc   Copper, serum   PTH, Intact and Calcium (Completed)   Lipid panel (Completed)   Hemoglobin A1c (Completed)   Selenium serum (Completed)   Balance disorder       has subspecialist neuro consult later this month at [facility] - she saw local neurology and rheumatology and therapy has helped some of her sx become stable   Moderate mixed hyperlipidemia not requiring statin therapy       young with low ASCVD risk  with mildly elevated cholesterol - monitoring annually, diet/lifestyle recommendations recommended   Relevant Orders   COMPLETE METABOLIC PANEL WITH GFR (Completed)   Lipid panel (Completed)   Prediabetes       hx of gestational DM, monitoring prediabetes, weight up a little, recheck labs, meds may be an option - she is still seeing weight management/bariatrics   Relevant Orders   COMPLETE METABOLIC PANEL WITH GFR (Completed)   Hemoglobin A1c (Completed)          Return in about 6 months (around 07/01/2023) for Annual Physical.   Delsa Grana, PA-C 12/31/22 8:53 AM

## 2022-12-31 ENCOUNTER — Ambulatory Visit (INDEPENDENT_AMBULATORY_CARE_PROVIDER_SITE_OTHER): Payer: Self-pay | Admitting: Family Medicine

## 2022-12-31 ENCOUNTER — Encounter: Payer: Self-pay | Admitting: Family Medicine

## 2022-12-31 VITALS — BP 128/84 | HR 83 | Temp 97.8°F | Resp 16 | Ht 63.0 in | Wt 315.9 lb

## 2022-12-31 DIAGNOSIS — R7303 Prediabetes: Secondary | ICD-10-CM

## 2022-12-31 DIAGNOSIS — Z6841 Body Mass Index (BMI) 40.0 and over, adult: Secondary | ICD-10-CM

## 2022-12-31 DIAGNOSIS — E782 Mixed hyperlipidemia: Secondary | ICD-10-CM

## 2022-12-31 DIAGNOSIS — K9089 Other intestinal malabsorption: Secondary | ICD-10-CM

## 2022-12-31 DIAGNOSIS — J4520 Mild intermittent asthma, uncomplicated: Secondary | ICD-10-CM

## 2022-12-31 DIAGNOSIS — D649 Anemia, unspecified: Secondary | ICD-10-CM

## 2022-12-31 DIAGNOSIS — Z9884 Bariatric surgery status: Secondary | ICD-10-CM

## 2022-12-31 DIAGNOSIS — R2689 Other abnormalities of gait and mobility: Secondary | ICD-10-CM

## 2022-12-31 DIAGNOSIS — G43009 Migraine without aura, not intractable, without status migrainosus: Secondary | ICD-10-CM

## 2022-12-31 DIAGNOSIS — G4733 Obstructive sleep apnea (adult) (pediatric): Secondary | ICD-10-CM

## 2022-12-31 DIAGNOSIS — J4521 Mild intermittent asthma with (acute) exacerbation: Secondary | ICD-10-CM

## 2022-12-31 MED ORDER — ELETRIPTAN HYDROBROMIDE 20 MG PO TABS: ORAL_TABLET | ORAL | 5 refills | Status: DC

## 2022-12-31 MED ORDER — ALBUTEROL SULFATE HFA 108 (90 BASE) MCG/ACT IN AERS: 2.0000 | INHALATION_SPRAY | RESPIRATORY_TRACT | 2 refills | Status: DC | PRN

## 2022-12-31 MED ORDER — ONDANSETRON 4 MG PO TBDP: 4.0000 mg | ORAL_TABLET | Freq: Three times a day (TID) | ORAL | 0 refills | Status: AC | PRN

## 2022-12-31 NOTE — Assessment & Plan Note (Addendum)
Once every couple of months, seem to occur with mentrual cycles Stress and food sometimes a trigger

## 2022-12-31 NOTE — Assessment & Plan Note (Addendum)
Good compliance, she feels well rested

## 2022-12-31 NOTE — Assessment & Plan Note (Addendum)
Well controlled, mild intermittent Uses inhaler *(albuterol rescue) usually only with acute illnesses No recent exacerbations requiring steroids

## 2023-01-03 ENCOUNTER — Other Ambulatory Visit: Payer: Self-pay | Admitting: Family Medicine

## 2023-01-03 ENCOUNTER — Encounter: Payer: Self-pay | Admitting: Family Medicine

## 2023-01-03 DIAGNOSIS — D649 Anemia, unspecified: Secondary | ICD-10-CM

## 2023-01-03 DIAGNOSIS — Z9884 Bariatric surgery status: Secondary | ICD-10-CM

## 2023-01-03 NOTE — Progress Notes (Signed)
Pts labs gradually worsening over the past 2 years IDA - likely due to bariatric surgery She has been on oral iron supplementation daily  Ref to hem/onc for IDA pt interested in iron infusion options - she has generalized fatigue otherwise hemodynamically stable Hemoglobin  Date Value Ref Range Status  12/31/2022 10.8 (L) 11.7 - 15.5 g/dL Final  02/06/2022 11.2 (L) 11.7 - 15.5 g/dL Final  10/18/2021 11.6 (L) 12.0 - 15.0 g/dL Final  10/04/2021 13.5 12.0 - 15.0 g/dL Final  02/26/2016 12.7 11.1 - 15.9 g/dL Final   HGB  Date Value Ref Range Status  12/10/2014 12.2 12.0 - 16.0 g/dL Final  04/10/2013 12.4 12.0 - 16.0 g/dL Final   Lab Results  Component Value Date   IRON 37 (L) 12/31/2022   TIBC 467 (H) 12/31/2022   FERRITIN 4 (L) 12/31/2022      ICD-10-CM   1. Anemia, unspecified type  D64.9 Ambulatory referral to Hematology / Oncology    2. Status post bariatric surgery  Z98.84 Ambulatory referral to Hematology / Oncology     Delsa Grana, PA-C

## 2023-01-03 NOTE — Assessment & Plan Note (Addendum)
Weight increased a little in the past year (+13 lbs), she continues to work with bariatric specialists  Wt Readings from Last 5 Encounters:  12/31/22 (!) 315 lb 14.4 oz (143.3 kg)  03/25/22 (!) 302 lb 3.2 oz (137.1 kg)  02/06/22 (!) 301 lb 9.6 oz (136.8 kg)  11/02/21 297 lb 9.6 oz (135 kg)  10/17/21 (!) 301 lb 9.4 oz (136.8 kg)   BMI Readings from Last 5 Encounters:  12/31/22 55.96 kg/m  03/25/22 53.53 kg/m  02/06/22 53.43 kg/m  11/02/21 52.72 kg/m  10/17/21 53.42 kg/m   Last f/up per specialists recommended RD and GLP-1 Agree with these recommendations

## 2023-01-03 NOTE — Assessment & Plan Note (Signed)
Labs being completed here per pt request - ordered and will be managed by her specialists

## 2023-01-08 ENCOUNTER — Telehealth: Payer: Self-pay

## 2023-01-08 NOTE — Telephone Encounter (Signed)
Courtesy called placed to NP.  Reviewed referral with patient, informed her of appt details, mask and visitor policy. All questions and concerns answered.

## 2023-01-09 ENCOUNTER — Inpatient Hospital Stay: Payer: 59 | Attending: Oncology | Admitting: Oncology

## 2023-01-09 ENCOUNTER — Inpatient Hospital Stay: Payer: 59

## 2023-01-09 ENCOUNTER — Encounter: Payer: Self-pay | Admitting: Oncology

## 2023-01-09 VITALS — BP 143/82 | HR 72 | Temp 97.9°F | Wt 318.2 lb

## 2023-01-09 DIAGNOSIS — G4730 Sleep apnea, unspecified: Secondary | ICD-10-CM | POA: Diagnosis not present

## 2023-01-09 DIAGNOSIS — R5383 Other fatigue: Secondary | ICD-10-CM | POA: Diagnosis not present

## 2023-01-09 DIAGNOSIS — Z79899 Other long term (current) drug therapy: Secondary | ICD-10-CM | POA: Insufficient documentation

## 2023-01-09 DIAGNOSIS — R0602 Shortness of breath: Secondary | ICD-10-CM | POA: Insufficient documentation

## 2023-01-09 DIAGNOSIS — G8929 Other chronic pain: Secondary | ICD-10-CM | POA: Diagnosis not present

## 2023-01-09 DIAGNOSIS — Z8349 Family history of other endocrine, nutritional and metabolic diseases: Secondary | ICD-10-CM | POA: Diagnosis not present

## 2023-01-09 DIAGNOSIS — Z887 Allergy status to serum and vaccine status: Secondary | ICD-10-CM | POA: Diagnosis not present

## 2023-01-09 DIAGNOSIS — N920 Excessive and frequent menstruation with regular cycle: Secondary | ICD-10-CM | POA: Diagnosis not present

## 2023-01-09 DIAGNOSIS — Z825 Family history of asthma and other chronic lower respiratory diseases: Secondary | ICD-10-CM | POA: Insufficient documentation

## 2023-01-09 DIAGNOSIS — Z8249 Family history of ischemic heart disease and other diseases of the circulatory system: Secondary | ICD-10-CM | POA: Insufficient documentation

## 2023-01-09 DIAGNOSIS — Z808 Family history of malignant neoplasm of other organs or systems: Secondary | ICD-10-CM | POA: Diagnosis not present

## 2023-01-09 DIAGNOSIS — Z832 Family history of diseases of the blood and blood-forming organs and certain disorders involving the immune mechanism: Secondary | ICD-10-CM | POA: Insufficient documentation

## 2023-01-09 DIAGNOSIS — Z833 Family history of diabetes mellitus: Secondary | ICD-10-CM | POA: Diagnosis not present

## 2023-01-09 DIAGNOSIS — Z8261 Family history of arthritis: Secondary | ICD-10-CM | POA: Insufficient documentation

## 2023-01-09 DIAGNOSIS — Z9884 Bariatric surgery status: Secondary | ICD-10-CM | POA: Insufficient documentation

## 2023-01-09 DIAGNOSIS — Z9049 Acquired absence of other specified parts of digestive tract: Secondary | ICD-10-CM | POA: Insufficient documentation

## 2023-01-09 DIAGNOSIS — D508 Other iron deficiency anemias: Secondary | ICD-10-CM | POA: Diagnosis present

## 2023-01-09 DIAGNOSIS — D509 Iron deficiency anemia, unspecified: Secondary | ICD-10-CM | POA: Insufficient documentation

## 2023-01-09 NOTE — Progress Notes (Signed)
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  I discussed about option IV Venofer treatments. I discussed about the potential risks including but not limited to allergic reactions/infusion reactions including anaphylactic reactions, phlebitis, high blood pressure, wheezing, SOB, skin rash, weight gain,dark urine, leg swelling, back pain, headache, nausea and fatigue, etc. Patient agree with   IV venofer weekly x 4   Menorrhagia Recommend gyn evaluation.   Orders Placed This Encounter  Procedures   CBC with Differential ([facility] Only)    Standing Status:   Future    Standing Expiration Date:   01/10/2024   Iron and TIBC    Standing Status:   Future    Standing Expiration Date:   01/10/2024   Ferritin    Standing Status:   Future    Standing Expiration Date:   01/10/2024   Retic Panel    Standing Status:   Future    Standing Expiration Date:   01/10/2024   Follow up in 3 months.  All questions were answered. The patient knows to call the clinic with any problems, questions or concerns.  Earlie Server, MD, PhD [facility] Hematology Oncology 01/09/2023     HISTORY OF PRESENTING ILLNESS:  Tanya Reeves is a  37 y.o.  female with PMH listed below who was referred to me for anemia Reviewed patient's recent labs that was done.   abnormal CBC on 2/13, Hb 10.8, mcv 75, elevated B12, normal folate, iron saturation 8, ferriitn 4, TIBC 467 Patient has bariatric surgery in 2022.  + fatigue, shortness of breath on  exertion She denies recent chest pain on exertion,  pre-syncopal episodes, or palpitations She had not noticed any recent bleeding such as  epistaxis, hematuria or hematochezia.  + Heavy menstrual period She denies over the counter NSAID ingestion.  She takes bariatric multivitamin and oral iron supplementation.      MEDICAL HISTORY:  Past Medical History:  Diagnosis Date   Asthma    Chronic hip pain    Chronic sinusitis 10/02/2016   Essential hypertension, benign 02/26/2016   Gastroesophageal reflux disease without esophagitis 08/26/2018   Hypertension    Incisional hernia with obstruction but no gangrene    Migraines    Morbid obesity ([place]) 03/17/2016   Pre-existing hypertension during pregnancy in third trimester 09/23/2014   Shortness of breath on exertion 08/26/2016   Sleep apnea     SURGICAL HISTORY: Past Surgical History:  Procedure Laterality Date   CESAREAN SECTION     2   CHOLECYSTECTOMY     COLON SURGERY     part of bowel removed due to c-section   INSERTION OF MESH  10/17/2021   Procedure: INSERTION OF MESH;  Surgeon: Olean Ree, MD;  Location: [facility] ORS;  Service: General;;   sips     STOMACH SURGERY     XI ROBOTIC ASSISTED VENTRAL HERNIA N/A 10/17/2021   Procedure: XI ROBOTIC ASSISTED VENTRAL HERNIA, incisional;  Surgeon: Olean Ree, MD;  Location: [facility] ORS;  Service: General;  Laterality: N/A;  Incisional hernia    SOCIAL HISTORY: Social History   Socioeconomic History   Marital status: Married    Spouse name: Christia Reading    Number of  children: 2   Years of education: Not on file   Highest education level: Associate degree: academic program  Occupational History   Not on file  Tobacco Use   Smoking status: Never   Smokeless tobacco: Never  Vaping Use   Vaping Use: Never used  Substance and Sexual Activity   Alcohol use: Yes    Alcohol/week: 0.0 standard drinks of alcohol    Comment: wine - 2 drinks per month   Drug use: No   Sexual activity: Yes    Partners: Male    Birth control/protection: Surgical    Comment: Tubal Ligation   Other Topics Concern   Not on file  Social  History Narrative   Not on file   Social Determinants of Health   Financial Resource Strain: Low Risk  (07/13/2018)   Overall Financial Resource Strain (CARDIA)    Difficulty of Paying Living Expenses: Not hard at all  Food Insecurity: No Food Insecurity (07/13/2018)   Hunger Vital Sign    Worried About Running Out of Food in the Last Year: Never true    Ran Out of Food in the Last Year: Never true  Transportation Needs: No Transportation Needs (07/13/2018)   PRAPARE - Hydrologist (Medical): No    Lack of Transportation (Non-Medical): No  Physical Activity: Sufficiently Active (02/19/2019)   Exercise Vital Sign    Days of Exercise per Week: 5 days    Minutes of Exercise per Session: 30 min  Stress: No Stress Concern Present (07/13/2018)   [place]    Feeling of Stress : Not at all  Social Connections: [place] (07/13/2018)   Social Connection and Isolation Panel [NHANES]    Frequency of Communication with Friends and Family: More than three times a week    Frequency of Social Gatherings with Friends and Family: More than three times a week    Attends Religious Services: More than 4 times per year    Active Member of [employer] or Organizations: Yes    Attends Archivist Meetings: More than 4 times per year    Marital Status: Married  Human resources officer Violence: Unknown (02/19/2019)   Humiliation, Afraid, Rape, and Kick questionnaire    Fear of Current or Ex-Partner: Not on file    Emotionally Abused: Not on file    Physically Abused: No    Sexually Abused: Not on file    FAMILY HISTORY: Family History  Problem Relation Age of Onset   Diabetes Mother    Cancer Mother        femal organs   Hypoparathyroidism Mother    Asthma Mother    Diabetes Father    Juvenile idiopathic arthritis Daughter    Diabetes Maternal Grandmother    Diabetes Maternal Grandfather     Leukemia Paternal Grandmother    Aneurysm Paternal Grandfather     ALLERGIES:  is allergic to cinnamon and influenza vaccines.  MEDICATIONS:  Current Outpatient Medications  Medication Sig Dispense Refill   albuterol (PROAIR HFA) 108 (90 Base) MCG/ACT inhaler Inhale 2 puffs into the lungs every 4 (four) hours as needed. 1 each 2   eletriptan (RELPAX) 20 MG tablet TAKE 1 TABLET BY MOUTH AS NEEDED FOR MIGRAINE OR HEADACHE. MAY REPEAT IN 2 HOURS IF HEADACHE PERSISTS OR RECURS 6 tablet 5   ferrous sulfate 325 (65 FE) MG tablet Take by mouth.     Multiple Vitamins-Minerals (BARIATRIC MULTIVITAMINS/IRON) CAPS  Take 1 capsule by mouth daily.     ondansetron (ZOFRAN ODT) 4 MG disintegrating tablet Take 1 tablet (4 mg total) by mouth every 8 (eight) hours as needed for nausea or vomiting. 30 tablet 0   No current facility-administered medications for this visit.    Review of Systems  Constitutional:  Positive for fatigue. Negative for appetite change, chills and fever.  HENT:   Negative for hearing loss and voice change.   Eyes:  Negative for eye problems.  Respiratory:  Negative for chest tightness and cough.   Cardiovascular:  Negative for chest pain.  Gastrointestinal:  Negative for abdominal distention, abdominal pain and blood in stool.  Endocrine: Negative for hot flashes.  Genitourinary:  Positive for menstrual problem. Negative for difficulty urinating and frequency.   Musculoskeletal:  Negative for arthralgias.  Skin:  Negative for itching and rash.  Neurological:  Negative for extremity weakness.  Hematological:  Negative for adenopathy.  Psychiatric/Behavioral:  Negative for confusion.     PHYSICAL EXAMINATION: ECOG PERFORMANCE STATUS: 1 - Symptomatic but completely ambulatory Vitals:   01/09/23 0926  BP: (!) 143/82  Pulse: 72  Temp: 97.9 F (36.6 C)  SpO2: 100%   Filed Weights   01/09/23 0926  Weight: (!) 318 lb 3.2 oz (144.3 kg)    Physical Exam Constitutional:       General: She is not in acute distress. HENT:     Head: Normocephalic and atraumatic.  Eyes:     General: No scleral icterus. Cardiovascular:     Rate and Rhythm: Normal rate and regular rhythm.     Heart sounds: Normal heart sounds.  Pulmonary:     Effort: Pulmonary effort is normal. No respiratory distress.     Breath sounds: No wheezing.  Abdominal:     General: Bowel sounds are normal. There is no distension.     Palpations: Abdomen is soft.  Musculoskeletal:        General: No deformity. Normal range of motion.     Cervical back: Normal range of motion and neck supple.  Skin:    General: Skin is warm and dry.     Findings: No erythema or rash.  Neurological:     Mental Status: She is alert and oriented to person, place, and time. Mental status is at baseline.     Cranial Nerves: No cranial nerve deficit.     Coordination: Coordination normal.  Psychiatric:        Mood and Affect: Mood normal.      LABORATORY DATA:  I have reviewed the data as listed    Latest Ref Rng & Units 12/31/2022    9:11 AM 02/06/2022    9:14 AM 10/18/2021    4:03 AM  CBC  WBC 3.8 - 10.8 Thousand/uL 6.3  6.5  11.3   Hemoglobin 11.7 - 15.5 g/dL 10.8  11.2  11.6   Hematocrit 35.0 - 45.0 % 33.9  35.2  36.8   Platelets 140 - 400 Thousand/uL 299  281  316       Latest Ref Rng & Units 12/31/2022    9:11 AM 02/06/2022    9:46 AM 10/18/2021    4:03 AM  CMP  Glucose 65 - 99 mg/dL 82  85  142   BUN 7 - 25 mg/dL 11  14  6   $ Creatinine 0.50 - 0.97 mg/dL 0.43  0.47  0.52   Sodium 135 - 146 mmol/L 139  139  134   Potassium  3.5 - 5.3 mmol/L 4.3  4.3  3.9   Chloride 98 - 110 mmol/L 104  104  102   CO2 20 - 32 mmol/L 27  25  25   $ Calcium 8.6 - 10.2 mg/dL 8.6 - 10.2 mg/dL 9.4    9.4  9.2  8.8   Total Protein 6.1 - 8.1 g/dL 7.2  7.2    Total Bilirubin 0.2 - 1.2 mg/dL 0.3  0.2    AST 10 - 30 U/L 14  9    ALT 6 - 29 U/L 18  13        Component Value Date/Time   IRON 37 (L) 12/31/2022 0911   TIBC  467 (H) 12/31/2022 0911   FERRITIN 4 (L) 12/31/2022 0911   IRONPCTSAT 8 (L) 12/31/2022 0911     RADIOGRAPHIC STUDIES: I have personally reviewed the radiological images as listed and agreed with the findings in the report. No results found.

## 2023-01-09 NOTE — Progress Notes (Signed)
Patient is referred here by Delsa Grana, PA for anemia post bariatric surgery.

## 2023-01-09 NOTE — Assessment & Plan Note (Signed)
Due to bariatric surgery and possible chronic blood loss from menorrhagia.  I discussed about option IV Venofer treatments. I discussed about the potential risks including but not limited to allergic reactions/infusion reactions including anaphylactic reactions, phlebitis, high blood pressure, wheezing, SOB, skin rash, weight gain,dark urine, leg swelling, back pain, headache, nausea and fatigue, etc. Patient agree with   IV venofer weekly x 4

## 2023-01-09 NOTE — Assessment & Plan Note (Signed)
Recommend gyn evaluation.

## 2023-01-13 ENCOUNTER — Inpatient Hospital Stay: Payer: 59

## 2023-01-13 VITALS — BP 125/63 | HR 75 | Temp 98.1°F

## 2023-01-13 DIAGNOSIS — D508 Other iron deficiency anemias: Secondary | ICD-10-CM | POA: Diagnosis not present

## 2023-01-13 MED ORDER — SODIUM CHLORIDE 0.9 % IV SOLN
Freq: Once | INTRAVENOUS | Status: AC
  Filled 2023-01-13: qty 250

## 2023-01-13 MED ORDER — SODIUM CHLORIDE 0.9 % IV SOLN
200.0000 mg | Freq: Once | INTRAVENOUS | Status: AC
  Administered 2023-01-13: 200 mg via INTRAVENOUS
  Filled 2023-01-13: qty 200

## 2023-01-13 NOTE — Patient Instructions (Signed)

## 2023-01-16 ENCOUNTER — Encounter: Payer: Self-pay | Admitting: Family Medicine

## 2023-01-17 LAB — CBC WITH DIFFERENTIAL/PLATELET
Absolute Monocytes: 397 cells/uL (ref 200–950)
Basophils Absolute: 50 cells/uL (ref 0–200)
Basophils Relative: 0.8 %
Eosinophils Absolute: 221 cells/uL (ref 15–500)
Eosinophils Relative: 3.5 %
HCT: 33.9 % — ABNORMAL LOW (ref 35.0–45.0)
Hemoglobin: 10.8 g/dL — ABNORMAL LOW (ref 11.7–15.5)
Lymphs Abs: 2350 cells/uL (ref 850–3900)
MCH: 24.2 pg — ABNORMAL LOW (ref 27.0–33.0)
MCHC: 31.9 g/dL — ABNORMAL LOW (ref 32.0–36.0)
MCV: 75.8 fL — ABNORMAL LOW (ref 80.0–100.0)
MPV: 10.7 fL (ref 7.5–12.5)
Monocytes Relative: 6.3 %
Neutro Abs: 3282 cells/uL (ref 1500–7800)
Neutrophils Relative %: 52.1 %
Platelets: 299 10*3/uL (ref 140–400)
RBC: 4.47 10*6/uL (ref 3.80–5.10)
RDW: 14.8 % (ref 11.0–15.0)
Total Lymphocyte: 37.3 %
WBC: 6.3 10*3/uL (ref 3.8–10.8)

## 2023-01-17 LAB — VITAMIN K1, SERUM: Vitamin K: 1350 pg/mL (ref 130–1500)

## 2023-01-17 LAB — HEMOGLOBIN A1C
Hgb A1c MFr Bld: 6.1 % of total Hgb — ABNORMAL HIGH (ref <5.7)
Mean Plasma Glucose: 128 mg/dL
eAG (mmol/L): 7.1 mmol/L

## 2023-01-17 LAB — IRON,TIBC AND FERRITIN PANEL
%SAT: 8 % (calc) — ABNORMAL LOW (ref 16–45)
Ferritin: 4 ng/mL — ABNORMAL LOW (ref 16–154)
Iron: 37 ug/dL — ABNORMAL LOW (ref 40–190)
TIBC: 467 mcg/dL (calc) — ABNORMAL HIGH (ref 250–450)

## 2023-01-17 LAB — COMPLETE METABOLIC PANEL WITH GFR
AG Ratio: 1.3 (calc) (ref 1.0–2.5)
ALT: 18 U/L (ref 6–29)
AST: 14 U/L (ref 10–30)
Albumin: 4 g/dL (ref 3.6–5.1)
Alkaline phosphatase (APISO): 62 U/L (ref 31–125)
BUN/Creatinine Ratio: 26 (calc) — ABNORMAL HIGH (ref 6–22)
BUN: 11 mg/dL (ref 7–25)
CO2: 27 mmol/L (ref 20–32)
Calcium: 9.4 mg/dL (ref 8.6–10.2)
Chloride: 104 mmol/L (ref 98–110)
Creat: 0.43 mg/dL — ABNORMAL LOW (ref 0.50–0.97)
Globulin: 3.2 g/dL (calc) (ref 1.9–3.7)
Glucose, Bld: 82 mg/dL (ref 65–99)
Potassium: 4.3 mmol/L (ref 3.5–5.3)
Sodium: 139 mmol/L (ref 135–146)
Total Bilirubin: 0.3 mg/dL (ref 0.2–1.2)
Total Protein: 7.2 g/dL (ref 6.1–8.1)
eGFR: 128 mL/min/{1.73_m2} (ref >=60)

## 2023-01-17 LAB — COPPER, SERUM: Copper: 132 ug/dL (ref 70–175)

## 2023-01-17 LAB — VITAMIN E
Gamma-Tocopherol (Vit E): 2 mg/L (ref <4.4)
Vitamin E (Alpha Tocopherol): 14.5 mg/L (ref 5.7–19.9)

## 2023-01-17 LAB — LIPID PANEL
Cholesterol: 183 mg/dL (ref <200)
HDL: 51 mg/dL (ref >=50)
LDL Cholesterol (Calc): 104 mg/dL (calc) — ABNORMAL HIGH
Non-HDL Cholesterol (Calc): 132 mg/dL (calc) — ABNORMAL HIGH (ref <130)
Total CHOL/HDL Ratio: 3.6 (calc) (ref <5.0)
Triglycerides: 161 mg/dL — ABNORMAL HIGH (ref <150)

## 2023-01-17 LAB — PTH, INTACT AND CALCIUM
Calcium: 9.4 mg/dL (ref 8.6–10.2)
PTH: 70 pg/mL (ref 16–77)

## 2023-01-17 LAB — B12 AND FOLATE PANEL
Folate: 14.9 ng/mL
Vitamin B-12: 1913 pg/mL — ABNORMAL HIGH (ref 200–1100)

## 2023-01-17 LAB — VITAMIN A: Vitamin A (Retinoic Acid): 72 ug/dL (ref 38–98)

## 2023-01-17 LAB — ZINC: Zinc: 73 ug/dL (ref 60–130)

## 2023-01-17 LAB — VITAMIN B1: Vitamin B1 (Thiamine): 19 nmol/L (ref 8–30)

## 2023-01-17 LAB — VITAMIN D 25 HYDROXY (VIT D DEFICIENCY, FRACTURES): Vit D, 25-Hydroxy: 25 ng/mL — ABNORMAL LOW (ref 30–100)

## 2023-01-17 LAB — SELENIUM SERUM: Selenium, Blood: 108 mcg/L (ref 63–160)

## 2023-01-20 ENCOUNTER — Encounter: Payer: Self-pay | Admitting: Family Medicine

## 2023-01-20 ENCOUNTER — Inpatient Hospital Stay: Payer: 59 | Attending: Oncology

## 2023-01-20 ENCOUNTER — Other Ambulatory Visit (HOSPITAL_COMMUNITY)
Admission: RE | Admit: 2023-01-20 | Discharge: 2023-01-20 | Disposition: A | Discharge status: Home / Self Care | Payer: 59 | Source: Ambulatory Visit | Attending: Family Medicine | Admitting: Family Medicine

## 2023-01-20 ENCOUNTER — Ambulatory Visit (INDEPENDENT_AMBULATORY_CARE_PROVIDER_SITE_OTHER): Payer: Self-pay | Admitting: Family Medicine

## 2023-01-20 VITALS — BP 128/78 | HR 85 | Temp 98.1°F | Resp 16 | Ht 63.0 in | Wt 320.3 lb

## 2023-01-20 VITALS — BP 122/55 | HR 80 | Temp 98.7°F | Resp 20

## 2023-01-20 DIAGNOSIS — D508 Other iron deficiency anemias: Secondary | ICD-10-CM | POA: Insufficient documentation

## 2023-01-20 DIAGNOSIS — Z0000 Encounter for general adult medical examination without abnormal findings: Secondary | ICD-10-CM

## 2023-01-20 DIAGNOSIS — Z124 Encounter for screening for malignant neoplasm of cervix: Secondary | ICD-10-CM

## 2023-01-20 DIAGNOSIS — N920 Excessive and frequent menstruation with regular cycle: Secondary | ICD-10-CM | POA: Diagnosis not present

## 2023-01-20 DIAGNOSIS — Z9884 Bariatric surgery status: Secondary | ICD-10-CM | POA: Diagnosis not present

## 2023-01-20 DIAGNOSIS — Z79899 Other long term (current) drug therapy: Secondary | ICD-10-CM | POA: Diagnosis not present

## 2023-01-20 DIAGNOSIS — R5383 Other fatigue: Secondary | ICD-10-CM | POA: Diagnosis not present

## 2023-01-20 MED ORDER — SODIUM CHLORIDE 0.9 % IV SOLN
200.0000 mg | Freq: Once | INTRAVENOUS | Status: AC
  Administered 2023-01-20: 200 mg via INTRAVENOUS
  Filled 2023-01-20: qty 200

## 2023-01-20 MED ORDER — SODIUM CHLORIDE 0.9 % IV SOLN
Freq: Once | INTRAVENOUS | Status: AC
  Filled 2023-01-20: qty 250

## 2023-01-20 NOTE — Progress Notes (Signed)
Patient: Tanya Reeves, Female    DOB: [birth year], 37 y.o.   MRN: [id number] Delsa Grana, PA-C Visit Date: 01/20/2023  Today's Provider: Delsa Grana, PA-C   Chief Complaint  Patient presents with   Annual Exam   Subjective:   Annual physical exam:  Tanya Reeves is a 37 y.o. 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) Diet/nutrition:   high protein, lower carb Sleep:  no concerns 8+  SDOH Screenings   Food Insecurity: No Food Insecurity (07/13/2018)  Housing: Low Risk  (01/09/2023)  Transportation Needs: No Transportation Needs (07/13/2018)  Utilities: Not At Risk (01/09/2023)  Alcohol Screen: Low Risk  (02/16/2020)  Depression (PHQ2-9): Low Risk  (01/20/2023)  Financial Resource Strain: Low Risk  (07/13/2018)  Physical Activity: Sufficiently Active (02/19/2019)  Social Connections: Socially Integrated (07/13/2018)  Stress: No Stress Concern Present (07/13/2018)  Tobacco Use: Low Risk  (01/20/2023)    USPSTF grade A and B recommendations - reviewed and addressed today  Depression:  Phq 9 completed today by patient, was reviewed by me with patient in the room PHQ score is neg, pt feels mood is good    01/20/2023    8:50 AM 12/31/2022    8:19 AM 03/25/2022   10:00 AM 02/06/2022    8:32 AM  PHQ 2/9 Scores  PHQ - 2 Score 0 0 0 0  PHQ- 9 Score 0 0 0 0      01/20/2023    8:50 AM 12/31/2022    8:19 AM 03/25/2022   10:00 AM 02/06/2022    8:32 AM 10/10/2021    1:59 PM  Depression screen PHQ 2/9  Decreased Interest 0 0 0 0 0  Down, Depressed, Hopeless 0 0 0 0 0  PHQ - 2 Score 0 0 0 0 0  Altered sleeping 0 0 0 0 0  Tired, decreased energy 0 0 0 0 0  Change in appetite 0 0 0 0 0  Feeling bad or failure about yourself  0 0 0 0 0  Trouble concentrating 0 0 0 0 0  Moving slowly or fidgety/restless 0 0 0 0 0  Suicidal thoughts 0 0 0 0 0  PHQ-9 Score 0 0 0 0 0  Difficult doing work/chores Not  difficult at all Not difficult at all Not difficult at all  Not difficult at all    Alcohol screening: [place] Office Visit from 02/16/2020 in [facility]  AUDIT-C Score 0       Immunizations and Health Maintenance: Health Maintenance  Topic Date Due   PAP SMEAR-Modifier  07/13/2021   INFLUENZA VACCINE  06/18/2022   DTaP/Tdap/Td (2 - Td or Tdap) 10/24/2024   Hepatitis C Screening  Completed   HIV Screening  Completed   HPV VACCINES  Aged Out   COVID-19 Vaccine  Discontinued     Hep C Screening: done previously  STD testing and prevention (HIV/chl/gon/syphilis):  see above, no additional testing desired by pt today  Intimate partner violence:  safe  Sexual History/Pain during Intercourse: Married, no pain, GU itchy spot - seen by GYN  Menstrual History/LMP/Abnormal Bleeding:   Patient's last menstrual period was 01/13/2023 (exact date). Heavy periods, regular cycles  Incontinence Symptoms: stress incontinence   Breast cancer:  has done mammo before Last Mammogram: *see HM list above BRCA gene screening: none known  Cervical cancer screening: due- today Pt no family  hx of cancers - breast, ovarian, uterine, colon:     Osteoporosis:   Discussion on osteoporosis per age, including high calcium and vitamin D supplementation, weight bearing exercises Pt is supplementing with daily calcium/Vit D.   Skin cancer:  Hx of skin CA -  NO - Derm [facility] Discussed atypical lesions   Colorectal cancer:   Colonoscopy is not due perage   Discussed concerning signs and sx of CRC, pt denies change in bowels, blood in stool  Lung cancer:   Low Dose CT Chest recommended if Age 81-80 years, 20 pack-year currently smoking OR have quit w/in 15years. Patient does not qualify.    Social History   Tobacco Use   Smoking status: Never   Smokeless tobacco: Never  Vaping Use   Vaping Use: Never used  Substance Use Topics   Alcohol use: Not Currently     Comment: Social   Drug use: No     Flowsheet Row Office Visit from 02/16/2020 in [facility]  AUDIT-C Score 0       Family History  Problem Relation Age of Onset   Diabetes Mother    Cancer Mother        femal organs   Hypoparathyroidism Mother    Asthma Mother    Diabetes Father    Juvenile idiopathic arthritis Daughter    Diabetes Maternal Grandmother    Diabetes Maternal Grandfather    Leukemia Paternal Grandmother    Aneurysm Paternal Grandfather      Blood pressure/Hypertension: BP Readings from Last 3 Encounters:  01/13/23 125/63  01/09/23 (!) 143/82  12/31/22 128/84    Weight/Obesity: Wt Readings from Last 3 Encounters:  01/09/23 (!) 318 lb 3.2 oz (144.3 kg)  12/31/22 (!) 315 lb 14.4 oz (143.3 kg)  03/25/22 (!) 302 lb 3.2 oz (137.1 kg)   BMI Readings from Last 3 Encounters:  01/09/23 56.37 kg/m  12/31/22 55.96 kg/m  03/25/22 53.53 kg/m     Lipids:  Lab Results  Component Value Date   CHOL 183 12/31/2022   CHOL 166 02/06/2022   CHOL 165 02/09/2021   Lab Results  Component Value Date   HDL 51 12/31/2022   HDL 48 (L) 02/06/2022   HDL 41 (L) 02/09/2021   Lab Results  Component Value Date   LDLCALC 104 (H) 12/31/2022   LDLCALC 95 02/06/2022   LDLCALC 99 02/09/2021   Lab Results  Component Value Date   TRIG 161 (H) 12/31/2022   TRIG 135 02/06/2022   TRIG 146 02/09/2021   Lab Results  Component Value Date   CHOLHDL 3.6 12/31/2022   CHOLHDL 3.5 02/06/2022   CHOLHDL 4.0 02/09/2021   No results found for: "LDLDIRECT" Based on the results of lipid panel his/her cardiovascular risk factor ( using [place] )  in the next 10 years is: The ASCVD Risk score (Arnett DK, et al., 2019) failed to calculate for the following reasons:   The 2019 ASCVD risk score is only valid for ages 60 to 56  Glucose:  Glucose  Date Value Ref Range Status  12/10/2014 83 65 - 99 mg/dL Final  04/10/2013 123 (H) 65 - 99 mg/dL Final    Glucose, Bld  Date Value Ref Range Status  12/31/2022 82 65 - 99 mg/dL Final    Comment:    .            Fasting reference interval .   02/06/2022 85 65 - 99 mg/dL Final  Comment:    .            Fasting reference interval .   10/18/2021 142 (H) 70 - 99 mg/dL Final    Comment:    Glucose reference range applies only to samples taken after fasting for at least 8 hours.   Glucose-Capillary  Date Value Ref Range Status  10/17/2021 184 (H) 70 - 99 mg/dL Final    Comment:    Glucose reference range applies only to samples taken after fasting for at least 8 hours.    Advanced Care Planning:  A voluntary discussion about advance care planning including the explanation and discussion of advance directives.   Discussed health care proxy and Living will, and the patient was able to identify a health care proxy as spouse.     Social History       Social History   Socioeconomic History   Marital status: Married    Spouse name: Timothy    Number of children: 2   Years of education: Not on file   Highest education level: Associate degree: academic program  Occupational History   Not on file  Tobacco Use   Smoking status: Never   Smokeless tobacco: Never  Vaping Use   Vaping Use: Never used  Substance and Sexual Activity   Alcohol use: Not Currently    Comment: Social   Drug use: No   Sexual activity: Yes    Partners: Male    Birth control/protection: Surgical    Comment: Tubal Ligation   Other Topics Concern   Not on file  Social History Narrative   Not on file   Social Determinants of Health   Financial Resource Strain: Low Risk  (07/13/2018)   Overall Financial Resource Strain (CARDIA)    Difficulty of Paying Living Expenses: Not hard at all  Food Insecurity: No Food Insecurity (07/13/2018)   Hunger Vital Sign    Worried About Running Out of Food in the Last Year: Never true    Ran Out of Food in the Last Year: Never true  Transportation Needs: No  Transportation Needs (07/13/2018)   PRAPARE - Hydrologist (Medical): No    Lack of Transportation (Non-Medical): No  Physical Activity: Sufficiently Active (02/19/2019)   Exercise Vital Sign    Days of Exercise per Week: 5 days    Minutes of Exercise per Session: 30 min  Stress: No Stress Concern Present (07/13/2018)   [place]    Feeling of Stress : Not at all  Social Connections: [place] (07/13/2018)   Social Connection and Isolation Panel [NHANES]    Frequency of Communication with Friends and Family: More than three times a week    Frequency of Social Gatherings with Friends and Family: More than three times a week    Attends Religious Services: More than 4 times per year    Active Member of [employer] or Organizations: Yes    Attends Music therapist: More than 4 times per year    Marital Status: Married    Family History        Family History  Problem Relation Age of Onset   Diabetes Mother    Cancer Mother        femal organs   Hypoparathyroidism Mother    Asthma Mother    Diabetes Father    Juvenile idiopathic arthritis Daughter    Diabetes Maternal Grandmother  Diabetes Maternal Grandfather    Leukemia Paternal Grandmother    Aneurysm Paternal Grandfather     Patient Active Problem List   Diagnosis Date Noted   IDA (iron deficiency anemia) 01/09/2023   Menorrhagia 01/09/2023   Arthralgia of both hands 02/06/2022   History of small bowel obstruction 02/09/2021   Pannus, 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   Asthma 07/18/2017   Hidradenitis suppurativa 03/24/2017   Class 3 severe obesity with body mass index (BMI) of 50.0 to 59.9 in adult ([place]) 03/17/2016   Migraine without aura 02/26/2016   OSA on CPAP 02/26/2016   Left ventricular hypertrophy 08/04/2014   Status post bariatric surgery  07/07/2014    Past Surgical History:  Procedure Laterality Date   CESAREAN SECTION     2   CHOLECYSTECTOMY     COLON SURGERY     part of bowel removed due to c-section   INSERTION OF MESH  10/17/2021   Procedure: INSERTION OF MESH;  Surgeon: Olean Ree, MD;  Location: [facility] ORS;  Service: General;;   sips     STOMACH SURGERY     XI ROBOTIC ASSISTED VENTRAL HERNIA N/A 10/17/2021   Procedure: XI ROBOTIC ASSISTED VENTRAL HERNIA, incisional;  Surgeon: Olean Ree, MD;  Location: [facility] ORS;  Service: General;  Laterality: N/A;  Incisional hernia     Current Outpatient Medications:    albuterol (PROAIR HFA) 108 (90 Base) MCG/ACT inhaler, Inhale 2 puffs into the lungs every 4 (four) hours as needed., Disp: 1 each, Rfl: 2   eletriptan (RELPAX) 20 MG tablet, TAKE 1 TABLET BY MOUTH AS NEEDED FOR MIGRAINE OR HEADACHE. MAY REPEAT IN 2 HOURS IF HEADACHE PERSISTS OR RECURS, Disp: 6 tablet, Rfl: 5   ferrous sulfate 325 (65 FE) MG tablet, Take by mouth., Disp: , Rfl:    Multiple Vitamins-Minerals (BARIATRIC MULTIVITAMINS/IRON) CAPS, Take 1 capsule by mouth daily., Disp: , Rfl:    ondansetron (ZOFRAN ODT) 4 MG disintegrating tablet, Take 1 tablet (4 mg total) by mouth every 8 (eight) hours as needed for nausea or vomiting., Disp: 30 tablet, Rfl: 0  Allergies  Allergen Reactions   Cinnamon Swelling    Tongue swells up. With artificial (such as in gum/red hots)   Influenza Vaccines Hives    Hives two years in a row.  Hives two years in a row.     Patient Care Team: Delsa Grana, PA-C as PCP - General (Family Medicine) Earlie Server, MD as Consulting Physician (Oncology)   Chart Review: I personally reviewed active problem list, medication list, allergies, family history, social history, health maintenance, notes from last encounter, lab results, imaging with the patient/caregiver today.   Review of Systems  Constitutional: Negative.   HENT: Negative.    Eyes: Negative.   Respiratory:  Negative.    Cardiovascular: Negative.   Gastrointestinal: Negative.   Endocrine: Negative.   Genitourinary: Negative.   Musculoskeletal: Negative.   Skin: Negative.   Allergic/Immunologic: Negative.   Neurological: Negative.   Hematological: Negative.   Psychiatric/Behavioral: Negative.    All other systems reviewed and are negative.         Objective:   Vitals:  There were no vitals filed for this visit.  There is no height or weight on file to calculate BMI.  Physical Exam Vitals and nursing note reviewed. Exam conducted with a chaperone present.  Constitutional:      General: She is not in acute distress.  Appearance: Normal appearance. She is well-developed. She is obese. She is not ill-appearing, toxic-appearing or diaphoretic.  HENT:     Head: Normocephalic and atraumatic.     Right Ear: External ear normal.     Left Ear: External ear normal.     Nose: Nose normal.     Mouth/Throat:     Mouth: Mucous membranes are moist.     Pharynx: Oropharynx is clear. Uvula midline.  Eyes:     General: Lids are normal. No scleral icterus.       Right eye: No discharge.        Left eye: No discharge.     Conjunctiva/sclera: Conjunctivae normal.  Neck:     Trachea: Phonation normal. No tracheal deviation.  Cardiovascular:     Rate and Rhythm: Normal rate and regular rhythm.     Pulses: Normal pulses.          Radial pulses are 2+ on the right side and 2+ on the left side.       Posterior tibial pulses are 2+ on the right side and 2+ on the left side.     Heart sounds: Normal heart sounds. No murmur heard.    No friction rub. No gallop.  Pulmonary:     Effort: Pulmonary effort is normal. No respiratory distress.     Breath sounds: Normal breath sounds. No stridor. No wheezing, rhonchi or rales.  Chest:     Chest wall: No tenderness.  Abdominal:     General: Bowel sounds are normal. There is no distension.     Palpations: Abdomen is soft.     Tenderness: There is no  abdominal tenderness. There is no guarding or rebound.     Comments: Large abd with panus and loose skin to monspubis, soft, non-tender  Genitourinary:    Pubic Area: No rash.      Vagina: Normal.     Cervix: No cervical motion tenderness, discharge, friability or erythema.     Uterus: Normal.      Adnexa: Right adnexa normal and left adnexa normal.  Musculoskeletal:        General: Normal range of motion.     Cervical back: Normal range of motion.     Right lower leg: No edema.     Left lower leg: No edema.  Lymphadenopathy:     Cervical: No cervical adenopathy.  Skin:    General: Skin is warm.     Coloration: Skin is not pale.     Findings: No lesion.  Neurological:     Mental Status: She is alert. Mental status is at baseline.     Motor: No abnormal muscle tone.     Gait: Gait normal.  Psychiatric:        Mood and Affect: Mood normal.        Speech: Speech normal.        Behavior: Behavior normal.       Fall Risk:    01/20/2023    8:49 AM 12/31/2022    8:19 AM 03/25/2022   10:00 AM 02/06/2022    8:32 AM 10/10/2021    1:59 PM  Fall Risk   Falls in the past year? 0 0 0 0 1  Number falls in past yr: 0 0 0  0  Injury with Fall? 0 0 0  0  Risk for fall due to : No Fall Risks No Fall Risks   Impaired balance/gait  Follow up Falls prevention discussed;Education provided;Falls evaluation  completed Falls prevention discussed;Education provided;Falls evaluation completed  Falls prevention discussed Falls prevention discussed    Functional Status Survey: Is the patient deaf or have difficulty hearing?: No Does the patient have difficulty seeing, even when wearing glasses/contacts?: No Does the patient have difficulty concentrating, remembering, or making decisions?: No Does the patient have difficulty walking or climbing stairs?: No Does the patient have difficulty dressing or bathing?: No Does the patient have difficulty doing errands alone such as visiting a doctor's office  or shopping?: No   Assessment & Plan:    CPE completed today  USPSTF grade A and B recommendations reviewed with patient; age-appropriate recommendations, preventive care, screening tests, etc discussed and encouraged; healthy living encouraged; see AVS for patient education given to patient  Discussed importance of 150 minutes of physical activity weekly, AHA exercise recommendations given to pt in AVS/handout  Discussed importance of healthy diet:  eating lean meats and proteins, avoiding trans fats and saturated fats, avoid simple sugars and excessive carbs in diet, eat 6 servings of fruit/vegetables daily and drink plenty of water and avoid sweet beverages.    Recommended pt to do annual eye exam and routine dental exams/cleanings  Depression, alcohol, fall screening completed as documented above and per flowsheets  Advance Care planning information and packet discussed and offered today, encouraged pt to discuss with family members/spouse/partner/friends and complete Advanced directive packet and bring copy to office   Reviewed Health Maintenance: Health Maintenance  Topic Date Due   PAP SMEAR-Modifier  07/13/2021   INFLUENZA VACCINE  06/18/2022   DTaP/Tdap/Td (2 - Td or Tdap) 10/24/2024   Hepatitis C Screening  Completed   HIV Screening  Completed   HPV VACCINES  Aged Out   COVID-19 Vaccine  Discontinued    Immunizations: Immunization History  Administered Date(s) Administered   Influenza,inj,Quad PF,6+ Mos 08/18/2014, 07/18/2017   Pneumococcal Polysaccharide-23 07/18/2017   Tdap 10/24/2014   Vaccines:  HPV: up to at age 68 , ask insurance if age between 32-45  Shingrix: 75-64 yo and ask insurance if covered when patient above 48 yo Pneumonia: done previously  Flu:  offered educated and discussed with patient. COVID:      ICD-10-CM   1. Annual physical exam  Z00.00 Cytology - PAP    2. Screening for malignant neoplasm of cervix  Z12.4 Cytology - PAP      Recently worsening IDA - she's consulted with hematology Discussed options for menses - can do continuous OCP to decrease chronic blood loss  PAP completed today     Delsa Grana, PA-C 01/20/23 8:56 AM  [facility]

## 2023-01-20 NOTE — Patient Instructions (Signed)

## 2023-01-20 NOTE — Patient Instructions (Signed)

## 2023-01-21 ENCOUNTER — Encounter: Payer: Self-pay | Admitting: Oncology

## 2023-01-24 LAB — CYTOLOGY - PAP
Comment: NEGATIVE
Diagnosis: NEGATIVE
High risk HPV: NEGATIVE

## 2023-01-27 ENCOUNTER — Inpatient Hospital Stay: Payer: 59

## 2023-01-27 ENCOUNTER — Encounter: Payer: Self-pay | Admitting: Oncology

## 2023-01-27 ENCOUNTER — Encounter: Payer: Self-pay | Admitting: Family Medicine

## 2023-01-27 VITALS — BP 122/56 | HR 90 | Temp 98.1°F | Resp 16

## 2023-01-27 DIAGNOSIS — D508 Other iron deficiency anemias: Secondary | ICD-10-CM | POA: Diagnosis not present

## 2023-01-27 MED ORDER — SODIUM CHLORIDE 0.9 % IV SOLN
Freq: Once | INTRAVENOUS | Status: AC
  Filled 2023-01-27: qty 250

## 2023-01-27 MED ORDER — SODIUM CHLORIDE 0.9 % IV SOLN
200.0000 mg | Freq: Once | INTRAVENOUS | Status: AC
  Administered 2023-01-27: 200 mg via INTRAVENOUS
  Filled 2023-01-27: qty 200

## 2023-01-27 NOTE — Patient Instructions (Signed)

## 2023-01-29 ENCOUNTER — Encounter: Payer: Self-pay | Admitting: Oncology

## 2023-01-30 ENCOUNTER — Encounter: Payer: Self-pay | Admitting: Oncology

## 2023-01-31 ENCOUNTER — Other Ambulatory Visit: Payer: Self-pay

## 2023-01-31 ENCOUNTER — Emergency Department: Payer: 59

## 2023-01-31 ENCOUNTER — Encounter: Payer: Self-pay | Admitting: Oncology

## 2023-01-31 ENCOUNTER — Inpatient Hospital Stay
Admission: EM | Admit: 2023-01-31 | Discharge: 2023-02-03 | DRG: 394 | Disposition: A | Discharge status: Home / Self Care | Payer: 59 | Attending: Obstetrics and Gynecology | Admitting: Obstetrics and Gynecology

## 2023-01-31 ENCOUNTER — Ambulatory Visit: Payer: Self-pay

## 2023-01-31 ENCOUNTER — Encounter: Payer: Self-pay | Admitting: Emergency Medicine

## 2023-01-31 DIAGNOSIS — K56609 Unspecified intestinal obstruction, unspecified as to partial versus complete obstruction: Secondary | ICD-10-CM | POA: Diagnosis not present

## 2023-01-31 DIAGNOSIS — R109 Unspecified abdominal pain: Secondary | ICD-10-CM

## 2023-01-31 DIAGNOSIS — K436 Other and unspecified ventral hernia with obstruction, without gangrene: Principal | ICD-10-CM | POA: Diagnosis present

## 2023-01-31 DIAGNOSIS — Z9049 Acquired absence of other specified parts of digestive tract: Secondary | ICD-10-CM

## 2023-01-31 DIAGNOSIS — Z9102 Food additives allergy status: Secondary | ICD-10-CM

## 2023-01-31 DIAGNOSIS — G4733 Obstructive sleep apnea (adult) (pediatric): Secondary | ICD-10-CM | POA: Diagnosis present

## 2023-01-31 DIAGNOSIS — D509 Iron deficiency anemia, unspecified: Secondary | ICD-10-CM | POA: Diagnosis present

## 2023-01-31 DIAGNOSIS — Z887 Allergy status to serum and vaccine status: Secondary | ICD-10-CM

## 2023-01-31 DIAGNOSIS — Z9884 Bariatric surgery status: Secondary | ICD-10-CM

## 2023-01-31 DIAGNOSIS — Z833 Family history of diabetes mellitus: Secondary | ICD-10-CM

## 2023-01-31 DIAGNOSIS — L732 Hidradenitis suppurativa: Secondary | ICD-10-CM | POA: Diagnosis present

## 2023-01-31 DIAGNOSIS — Z79899 Other long term (current) drug therapy: Secondary | ICD-10-CM

## 2023-01-31 DIAGNOSIS — Z8719 Personal history of other diseases of the digestive system: Secondary | ICD-10-CM

## 2023-01-31 DIAGNOSIS — K567 Ileus, unspecified: Secondary | ICD-10-CM | POA: Diagnosis present

## 2023-01-31 DIAGNOSIS — Z806 Family history of leukemia: Secondary | ICD-10-CM

## 2023-01-31 DIAGNOSIS — Z825 Family history of asthma and other chronic lower respiratory diseases: Secondary | ICD-10-CM

## 2023-01-31 DIAGNOSIS — Z6841 Body Mass Index (BMI) 40.0 and over, adult: Secondary | ICD-10-CM

## 2023-01-31 DIAGNOSIS — Z1152 Encounter for screening for COVID-19: Secondary | ICD-10-CM

## 2023-01-31 DIAGNOSIS — J45909 Unspecified asthma, uncomplicated: Secondary | ICD-10-CM | POA: Diagnosis present

## 2023-01-31 DIAGNOSIS — I10 Essential (primary) hypertension: Secondary | ICD-10-CM | POA: Diagnosis present

## 2023-01-31 DIAGNOSIS — Z5329 Procedure and treatment not carried out because of patient's decision for other reasons: Secondary | ICD-10-CM | POA: Diagnosis present

## 2023-01-31 DIAGNOSIS — G43909 Migraine, unspecified, not intractable, without status migrainosus: Secondary | ICD-10-CM | POA: Diagnosis present

## 2023-01-31 LAB — LIPASE, BLOOD: Lipase: 46 U/L (ref 11–51)

## 2023-01-31 LAB — URINALYSIS, ROUTINE W REFLEX MICROSCOPIC
Bacteria, UA: NONE SEEN
Bilirubin Urine: NEGATIVE
Glucose, UA: NEGATIVE mg/dL
Hgb urine dipstick: NEGATIVE
Ketones, ur: NEGATIVE mg/dL
Leukocytes,Ua: NEGATIVE
Nitrite: NEGATIVE
Protein, ur: 30 mg/dL — AB
Specific Gravity, Urine: 1.029 (ref 1.005–1.030)
pH: 6 (ref 5.0–8.0)

## 2023-01-31 LAB — RESP PANEL BY RT-PCR (RSV, FLU A&B, COVID)  RVPGX2
Influenza A by PCR: NEGATIVE
Influenza B by PCR: NEGATIVE
Resp Syncytial Virus by PCR: NEGATIVE
SARS Coronavirus 2 by RT PCR: NEGATIVE

## 2023-01-31 LAB — COMPREHENSIVE METABOLIC PANEL
ALT: 21 U/L (ref 0–44)
AST: 16 U/L (ref 15–41)
Albumin: 3.5 g/dL (ref 3.5–5.0)
Alkaline Phosphatase: 54 U/L (ref 38–126)
Anion gap: 9 (ref 5–15)
BUN: 13 mg/dL (ref 6–20)
CO2: 24 mmol/L (ref 22–32)
Calcium: 8.9 mg/dL (ref 8.9–10.3)
Chloride: 106 mmol/L (ref 98–111)
Creatinine, Ser: 0.47 mg/dL (ref 0.44–1.00)
GFR, Estimated: >60 mL/min (ref >60)
Glucose, Bld: 120 mg/dL — ABNORMAL HIGH (ref 70–99)
Potassium: 3.4 mmol/L — ABNORMAL LOW (ref 3.5–5.1)
Sodium: 139 mmol/L (ref 135–145)
Total Bilirubin: 0.5 mg/dL (ref 0.3–1.2)
Total Protein: 7.3 g/dL (ref 6.5–8.1)

## 2023-01-31 LAB — TROPONIN I (HIGH SENSITIVITY): Troponin I (High Sensitivity): 3 ng/L (ref <18)

## 2023-01-31 LAB — POC URINE PREG, ED: Preg Test, Ur: NEGATIVE

## 2023-01-31 LAB — CBC WITH DIFFERENTIAL/PLATELET

## 2023-01-31 LAB — GROUP A STREP BY PCR: Group A Strep by PCR: NOT DETECTED

## 2023-01-31 MED ORDER — HYDROMORPHONE HCL 1 MG/ML IJ SOLN
0.5000 mg | Freq: Once | INTRAMUSCULAR | Status: AC
  Administered 2023-01-31: 0.5 mg via INTRAVENOUS
  Filled 2023-01-31: qty 0.5

## 2023-01-31 MED ORDER — SODIUM CHLORIDE 0.9 % IV BOLUS
1000.0000 mL | Freq: Once | INTRAVENOUS | Status: AC
  Administered 2023-01-31: 1000 mL via INTRAVENOUS

## 2023-01-31 MED ORDER — IOHEXOL 300 MG/ML  SOLN
100.0000 mL | Freq: Once | INTRAMUSCULAR | Status: AC | PRN
  Administered 2023-01-31: 100 mL via INTRAVENOUS

## 2023-01-31 MED ORDER — ONDANSETRON HCL 4 MG/2ML IJ SOLN
4.0000 mg | Freq: Once | INTRAMUSCULAR | Status: AC
  Administered 2023-01-31: 4 mg via INTRAVENOUS
  Filled 2023-01-31: qty 2

## 2023-01-31 MED FILL — Iron Sucrose Inj 20 MG/ML (Fe Equiv): INTRAVENOUS | Qty: 10 | Status: AC

## 2023-01-31 NOTE — ED Provider Notes (Addendum)
[facility] Provider Note    Event Date/Time   First MD Initiated Contact with Patient 01/31/23 2216     (approximate)   History   Abdominal Pain   HPI  Tanya Reeves is a 37 y.o. female with history of SBO, hidradenitis suppurativa who comes in with concerns for abdominal pain.  Patient reports about 5 days ago she felt really sick with nausea vomiting diarrhea fevers but that has since resolved.  However then today she started developing abdominal pain that is in her upper abdomen associated with nausea vomiting last bowel movement this morning.  She reports being concerned that it could be another bowel obstruction.  She denies any chest pain, shortness of breath.  She does report having history of Hid suppurativa currently being on doxycycline to help with that.  She does report a mild sore throat and her family member did just recently have strep and presented with abdominal symptoms as well.  Physical Exam   Triage Vital Signs: ED Triage Vitals [01/31/23 2211]  Enc Vitals Group     BP (!) 155/92     Pulse Rate 78     Resp 20     Temp 98.5 F (36.9 C)     Temp Source Oral     SpO2 97 %     Weight (!) 320 lb (145.2 kg)     Height 5\' 3"  (1.6 m)     Head Circumference      Peak Flow      Pain Score 8     Pain Loc      Pain Edu?      Excl. in [place]?     Most recent vital signs: Vitals:   01/31/23 2211  BP: (!) 155/92  Pulse: 78  Resp: 20  Temp: 98.5 F (36.9 C)  SpO2: 97%     General: Awake, no distress.  CV:  Good peripheral perfusion.  Resp:  Normal effort.  Abd:  No distention.  Patient has tenderness in her upper abdomen she is got a pannus noted with some superficial areas consistent with hidradenitis near her labia but no fluctuation Other:  Uvula midline.  No erythema   ED Results / Procedures / Treatments   Labs (all labs ordered are listed, but only abnormal results are displayed) Labs Reviewed  RESP PANEL BY RT-PCR  (RSV, FLU A&B, COVID)  [id number]  GROUP A STREP BY PCR  CBC WITH DIFFERENTIAL/PLATELET  COMPREHENSIVE METABOLIC PANEL  LIPASE, BLOOD  URINALYSIS, ROUTINE W REFLEX MICROSCOPIC  HCG, QUANTITATIVE, PREGNANCY  POC URINE PREG, ED  TROPONIN I (HIGH SENSITIVITY)     EKG  My interpretation of EKG:  Normal sinus 74 without ST or T wave inversions, normal intervals  RADIOLOGY pending   PROCEDURES:  Critical Care performed: No  .1-3 Lead EKG Interpretation  Performed by: Vanessa Tyler, MD Authorized by: Vanessa Sarben, MD     Interpretation: normal     ECG rate:  70   ECG rate assessment: normal     Rhythm: sinus rhythm     Ectopy: none     Conduction: normal      MEDICATIONS ORDERED IN ED: Medications  HYDROmorphone (DILAUDID) injection 0.5 mg (has no administration in time range)  ondansetron (ZOFRAN) injection 4 mg (has no administration in time range)  sodium chloride 0.9 % bolus 1,000 mL (has no administration in time range)     IMPRESSION / MDM / ASSESSMENT AND PLAN /  ED COURSE  I reviewed the triage vital signs and the nursing notes.   Patient's presentation is most consistent with acute presentation with potential threat to life or bodily function.   Differentials SBO, COVID, flu, strep, UTI, ACS seems less likely.  Labs will be ordered patient given some fluids Zofran Dilaudid to help with symptoms.  I did evaluate the area where patient was concerned about the hidradenitis.  Patient does not have any evidence of any abscess that would require any drainage.  Pregt test was negative  Patient handed off pending CT, labs  The patient is on the cardiac monitor to evaluate for evidence of arrhythmia and/or significant heart rate changes.      FINAL CLINICAL IMPRESSION(S) / ED DIAGNOSES   Final diagnoses:  Abdominal pain, unspecified abdominal location     Rx / DC Orders   ED Discharge Orders     None        Note:  This document was prepared using  Dragon voice recognition software and may include unintentional dictation errors.   Vanessa [place], MD 01/31/23 2314    Vanessa [place], MD 01/31/23 [phone]

## 2023-01-31 NOTE — Telephone Encounter (Signed)
I called back and spoke to patient. Pt was advised that she would need a in office appt for her situation. No availabilities for in office. Pt was then advised if she felt she could not wait until Monday to be sure to go to the UC. Pt was willing to go if she needs to.

## 2023-01-31 NOTE — Telephone Encounter (Signed)
  Chief Complaint: abscess Symptoms: abscess in mons area size of orange, slightly draining, nausea Frequency: 2 days  Pertinent Negatives: NA Disposition: [] ED /[] Urgent Care (no appt availability in office) / [] Appointment(In office/virtual)/ []  [place] [facility]/ [] Home Care/ [] Refused Recommended Disposition /[] [place] [facility]/ [x]  Follow-up with PCP Additional Notes: pt states she has hx of Hidradenitis suppurativa and gets abscesses frequently and normally deals with Dermatology. They have recently put her on Doxycycline for an abscess she has which has improved but since being on it for 5 days now she has developed a new abscess that is on opposite side of her mons area and is orange sized and is slightly draining. She states she thought she had a GI bug earlier in the week but feels like may be r/t this abscess since it has come up while being on Doxycycline. Pt said Dermatology is closed today which is why she reaching out to PCP. Pt can do OV or VV whichever is needed. Called and spoke with [name], [facility] who went and spoke with nurse and nurse would like to speak with pt. I let [name] know that pt had already hung up so CMA was going to call pt back to get more information.   Summary: Abscess Advice   Pt is calling to ask nurse that she has an abscess the size of an orange. Pt has been on the antibiotic for 5 days and it is improving slightly drainage is minimal in the comparison to the size of the abscess. Does the course of tx need to change? Please advise         Reason for Disposition  [1] Treatment (antibiotic, I&D, or moist heat) > 24 hours AND [2] symptoms WORSE (e.g., pain, spreading redness, swelling)  Answer Assessment - Initial Assessment Questions 2. LOCATION: "Where is the boil located?"      In perineal area  3. NUMBER: "How many boils are there?"      2, 1 is closing up and then new abscess  4. SIZE: "How big is the boil?" (e.g., inches, cm; compare to size  of a coin or other object)     Size of orange  5. ONSET: "When did the boil start?"     2 days  6. PAIN: "Is there any pain?" If Yes, ask: "How bad is the pain?"  (Scale 1-10; or mild, moderate, severe)     Mild 8. TREATMENT: "What treatment did you get or are you getting for the boil?" (e.g., I&D, antibiotics, moist heat)     Doxycycline  9. OTHER SYMPTOMS: "Do you have any other symptoms?" (e.g., rash elsewhere on body, shaking chills, spreading redness of nearby skin or red streaks, weakness)      Nausea, slight drainage  Protocols used: Boil (Skin Abscess) on Treatment Follow-up Call-A-[facility]

## 2023-01-31 NOTE — ED Provider Notes (Signed)
-----------------------------------------   11:45 PM on 01/31/2023 -----------------------------------------  Assuming care from Dr. Jari Pigg.  In short, Tanya Reeves is a 37 y.o. female with a chief complaint of abdominal pain with nausea.  Refer to the original H&P for additional details.  The current plan of care is to follow up CT scan and reassess.   Clinical Course as of 02/01/23 0217  Sat Feb 01, 2023  0130 I viewed and interpreted the patient's CT of the abdomen pelvis.  There appears to be some dilated loops of bowel.  The radiologist confirmed that there are loops of bowel within the ventral hernia that seem to be causing a low-grade SBO.  The patient is still having pain and nausea, so I ordered morphine 4 mg IV and Zofran 4 mg IV.  I offered an NG tube but she says she had 1 in the past and it was "the worst experience of my life".  We will hold off unless she starts vomiting again or unless specifically recommended by the admission team. [CF]  0148 Given the patient's complicated surgical history, I consulted by phone with Dr. Dahlia Byes.  We discussed the case in detail and he agreed that we could admit the patient here.  I explained that she is passing on the NG tube at this time.  He agreed with the plan for hospitalist admission and he is aware of the patient and will see her in the morning.  I am consulting the hospitalist team for admission. [CF]  [id number] Consulted Dr. Damita Dunnings with the hospitalist service.  We discussed the case and she will admit. [CF]    Clinical Course User Index [CF] Hinda Kehr, MD     Medications  HYDROmorphone (DILAUDID) injection 0.5 mg (0.5 mg Intravenous Given 01/31/23 2313)  ondansetron (ZOFRAN) injection 4 mg (4 mg Intravenous Given 01/31/23 2313)  sodium chloride 0.9 % bolus 1,000 mL (0 mLs Intravenous Stopped 02/01/23 0045)  iohexol (OMNIPAQUE) 300 MG/ML solution 100 mL (100 mLs Intravenous Contrast Given 01/31/23 2349)  morphine (PF) 4 MG/ML  injection 4 mg (4 mg Intravenous Given 02/01/23 0130)  ondansetron (ZOFRAN) injection 4 mg (4 mg Intravenous Given 02/01/23 0129)     ED Discharge Orders     None      Final diagnoses:  Intestinal obstruction, unspecified cause, unspecified whether partial or complete (HCC)     Hinda Kehr, MD 02/01/23 [phone]

## 2023-01-31 NOTE — ED Triage Notes (Addendum)
  Patient comes in with abdominal pain that started earlier today.  Patient states she has had two previous SBOs and worries this may be the same. Patient states she has had several hernia repair surgeries, duodenal switch, and has implanted mesh.   Patient endorses nausea/vomiting today.  Patient states she had normal BM earlier this morning.  Pain 8/10, throbbing.

## 2023-02-01 ENCOUNTER — Inpatient Hospital Stay: Payer: 59

## 2023-02-01 DIAGNOSIS — J4520 Mild intermittent asthma, uncomplicated: Secondary | ICD-10-CM | POA: Diagnosis not present

## 2023-02-01 DIAGNOSIS — D509 Iron deficiency anemia, unspecified: Secondary | ICD-10-CM | POA: Diagnosis present

## 2023-02-01 DIAGNOSIS — Z9102 Food additives allergy status: Secondary | ICD-10-CM | POA: Diagnosis not present

## 2023-02-01 DIAGNOSIS — Z806 Family history of leukemia: Secondary | ICD-10-CM | POA: Diagnosis not present

## 2023-02-01 DIAGNOSIS — I10 Essential (primary) hypertension: Secondary | ICD-10-CM | POA: Diagnosis present

## 2023-02-01 DIAGNOSIS — K56609 Unspecified intestinal obstruction, unspecified as to partial versus complete obstruction: Secondary | ICD-10-CM | POA: Diagnosis not present

## 2023-02-01 DIAGNOSIS — Z833 Family history of diabetes mellitus: Secondary | ICD-10-CM | POA: Diagnosis not present

## 2023-02-01 DIAGNOSIS — Z6841 Body Mass Index (BMI) 40.0 and over, adult: Secondary | ICD-10-CM | POA: Diagnosis not present

## 2023-02-01 DIAGNOSIS — Z79899 Other long term (current) drug therapy: Secondary | ICD-10-CM | POA: Diagnosis not present

## 2023-02-01 DIAGNOSIS — Z1152 Encounter for screening for COVID-19: Secondary | ICD-10-CM | POA: Diagnosis not present

## 2023-02-01 DIAGNOSIS — Z887 Allergy status to serum and vaccine status: Secondary | ICD-10-CM | POA: Diagnosis not present

## 2023-02-01 DIAGNOSIS — Z9884 Bariatric surgery status: Secondary | ICD-10-CM | POA: Diagnosis not present

## 2023-02-01 DIAGNOSIS — K56600 Partial intestinal obstruction, unspecified as to cause: Secondary | ICD-10-CM | POA: Diagnosis not present

## 2023-02-01 DIAGNOSIS — K567 Ileus, unspecified: Secondary | ICD-10-CM | POA: Diagnosis present

## 2023-02-01 DIAGNOSIS — K430 Incisional hernia with obstruction, without gangrene: Secondary | ICD-10-CM

## 2023-02-01 DIAGNOSIS — K436 Other and unspecified ventral hernia with obstruction, without gangrene: Secondary | ICD-10-CM | POA: Diagnosis present

## 2023-02-01 DIAGNOSIS — Z5329 Procedure and treatment not carried out because of patient's decision for other reasons: Secondary | ICD-10-CM | POA: Diagnosis present

## 2023-02-01 DIAGNOSIS — Z9049 Acquired absence of other specified parts of digestive tract: Secondary | ICD-10-CM | POA: Diagnosis not present

## 2023-02-01 DIAGNOSIS — G43909 Migraine, unspecified, not intractable, without status migrainosus: Secondary | ICD-10-CM | POA: Diagnosis present

## 2023-02-01 DIAGNOSIS — J45909 Unspecified asthma, uncomplicated: Secondary | ICD-10-CM | POA: Diagnosis present

## 2023-02-01 DIAGNOSIS — Z825 Family history of asthma and other chronic lower respiratory diseases: Secondary | ICD-10-CM | POA: Diagnosis not present

## 2023-02-01 DIAGNOSIS — L732 Hidradenitis suppurativa: Secondary | ICD-10-CM | POA: Diagnosis present

## 2023-02-01 DIAGNOSIS — G4733 Obstructive sleep apnea (adult) (pediatric): Secondary | ICD-10-CM | POA: Diagnosis present

## 2023-02-01 LAB — HIV ANTIBODY (ROUTINE TESTING W REFLEX): HIV Screen 4th Generation wRfx: NONREACTIVE

## 2023-02-01 LAB — CBC WITH DIFFERENTIAL/PLATELET
Abs Immature Granulocytes: 0.07 10*3/uL (ref 0.00–0.07)
Basophils Absolute: 0.1 10*3/uL (ref 0.0–0.1)
Basophils Relative: 1 %
Eosinophils Absolute: 0.2 10*3/uL (ref 0.0–0.5)
Eosinophils Relative: 3 %
HCT: 34.2 % — ABNORMAL LOW (ref 36.0–46.0)
Hemoglobin: 10.5 g/dL — ABNORMAL LOW (ref 12.0–15.0)
Immature Granulocytes: 1 %
Lymphocytes Relative: 33 %
Lymphs Abs: 2.7 10*3/uL (ref 0.7–4.0)
MCH: 24.5 pg — ABNORMAL LOW (ref 26.0–34.0)
MCHC: 30.7 g/dL (ref 30.0–36.0)
MCV: 79.7 fL — ABNORMAL LOW (ref 80.0–100.0)
Monocytes Absolute: 0.6 10*3/uL (ref 0.1–1.0)
Monocytes Relative: 7 %
Neutro Abs: 4.6 10*3/uL (ref 1.7–7.7)
Neutrophils Relative %: 55 %
Platelets: 260 10*3/uL (ref 150–400)
RBC: 4.29 MIL/uL (ref 3.87–5.11)
RDW: 17.2 % — ABNORMAL HIGH (ref 11.5–15.5)
WBC: 8.2 10*3/uL (ref 4.0–10.5)
nRBC: 0 % (ref 0.0–0.2)

## 2023-02-01 LAB — CBC
HCT: 31.3 % — ABNORMAL LOW (ref 36.0–46.0)
Hemoglobin: 9.7 g/dL — ABNORMAL LOW (ref 12.0–15.0)
MCH: 24.4 pg — ABNORMAL LOW (ref 26.0–34.0)
MCHC: 31 g/dL (ref 30.0–36.0)
MCV: 78.8 fL — ABNORMAL LOW (ref 80.0–100.0)
Platelets: 236 10*3/uL (ref 150–400)
RBC: 3.97 MIL/uL (ref 3.87–5.11)
RDW: 17.2 % — ABNORMAL HIGH (ref 11.5–15.5)
WBC: 7.4 10*3/uL (ref 4.0–10.5)
nRBC: 0 % (ref 0.0–0.2)

## 2023-02-01 LAB — CREATININE, SERUM
Creatinine, Ser: 0.43 mg/dL — ABNORMAL LOW (ref 0.44–1.00)
GFR, Estimated: >60 mL/min (ref >60)

## 2023-02-01 LAB — HCG, QUANTITATIVE, PREGNANCY: hCG, Beta Chain, Quant, S: <1 m[IU]/mL (ref <5)

## 2023-02-01 MED ORDER — ACETAMINOPHEN 325 MG PO TABS
650.0000 mg | ORAL_TABLET | Freq: Four times a day (QID) | ORAL | Status: DC | PRN
  Administered 2023-02-02: 650 mg via ORAL
  Filled 2023-02-01: qty 2

## 2023-02-01 MED ORDER — LACTATED RINGERS IV SOLN: INTRAVENOUS | Status: DC

## 2023-02-01 MED ORDER — MORPHINE SULFATE (PF) 4 MG/ML IV SOLN
4.0000 mg | Freq: Once | INTRAVENOUS | Status: AC
  Administered 2023-02-01: 4 mg via INTRAVENOUS
  Filled 2023-02-01: qty 1

## 2023-02-01 MED ORDER — ENOXAPARIN SODIUM 80 MG/0.8ML IJ SOSY
0.5000 mg/kg | PREFILLED_SYRINGE | INTRAMUSCULAR | Status: DC
  Administered 2023-02-01 – 2023-02-02 (×2): 72.5 mg via SUBCUTANEOUS
  Filled 2023-02-01 (×3): qty 0.72

## 2023-02-01 MED ORDER — ONDANSETRON HCL 4 MG/2ML IJ SOLN
4.0000 mg | INTRAMUSCULAR | Status: AC
  Administered 2023-02-01: 4 mg via INTRAVENOUS
  Filled 2023-02-01: qty 2

## 2023-02-01 MED ORDER — ONDANSETRON HCL 4 MG PO TABS: 4.0000 mg | ORAL_TABLET | Freq: Four times a day (QID) | ORAL | Status: DC | PRN

## 2023-02-01 MED ORDER — ALBUTEROL SULFATE (2.5 MG/3ML) 0.083% IN NEBU: 2.5000 mg | INHALATION_SOLUTION | RESPIRATORY_TRACT | Status: DC | PRN

## 2023-02-01 MED ORDER — ONDANSETRON HCL 4 MG/2ML IJ SOLN: 4.0000 mg | Freq: Four times a day (QID) | INTRAMUSCULAR | Status: DC | PRN

## 2023-02-01 MED ORDER — SODIUM CHLORIDE 0.9 % IV SOLN
1.0000 g | INTRAVENOUS | Status: DC
  Administered 2023-02-01 – 2023-02-03 (×3): 1 g via INTRAVENOUS
  Filled 2023-02-01: qty 1
  Filled 2023-02-01: qty 10
  Filled 2023-02-01: qty 1

## 2023-02-01 MED ORDER — MORPHINE SULFATE (PF) 2 MG/ML IV SOLN: 2.0000 mg | INTRAVENOUS | Status: DC | PRN

## 2023-02-01 MED ORDER — ACETAMINOPHEN 650 MG RE SUPP: 650.0000 mg | Freq: Four times a day (QID) | RECTAL | Status: DC | PRN

## 2023-02-01 NOTE — Assessment & Plan Note (Addendum)
S/p duodenal switch in 2020 Might benefit from new inject double weight loss agents in addition to lifestyle modification

## 2023-02-01 NOTE — Assessment & Plan Note (Addendum)
Albuterol as needed 

## 2023-02-01 NOTE — Consult Note (Signed)
Patient ID: Tanya Reeves, female   DOB: Jul 05, 1986, 37 y.o.   MRN: [id number]  HPI Tanya Reeves is a 37 y.o. female e with complex close surgical history including single anastomosis duodenal switch 2020 Dr. Darnell Level..She   Has improved her weight but continues to be significantly overweight.  Current BMI is 56.  SHe presents with intermittent obstructions and this is the third episode associated with a recurrent ventral hernia.  Presented with abdominal pain and nausea pain has been present for 5 days is colicky intermittent and moderate intensity.  Presented with some vomiting. This is the third episode.  She did have a robotic ventral hernia repair with Dr. Hampton Abbot 2022. She did have a CT scan showing evidence of a recurrence and some dilation proximal to the ventral hernia defect.  There is no evidence of internal hernias. Labs notable for potassium of 3.4. Hemoglobin at baseline at 10.5. WBC normal.  HPI  Past Medical History:  Diagnosis Date   Asthma    Chronic hip pain    Chronic sinusitis 10/02/2016   Essential hypertension, benign 02/26/2016   Gastroesophageal reflux disease without esophagitis 08/26/2018   Hypertension    Incisional hernia with obstruction but no gangrene    Migraines    Morbid obesity ([place]) 03/17/2016   Pre-existing hypertension during pregnancy in third trimester 09/23/2014   Shortness of breath on exertion 08/26/2016   Sleep apnea     Past Surgical History:  Procedure Laterality Date   CESAREAN SECTION     2   CHOLECYSTECTOMY     COLON SURGERY     part of bowel removed due to c-section   INSERTION OF MESH  10/17/2021   Procedure: INSERTION OF MESH;  Surgeon: Olean Ree, MD;  Location: [facility] ORS;  Service: General;;   sips     STOMACH SURGERY     XI ROBOTIC ASSISTED VENTRAL HERNIA N/A 10/17/2021   Procedure: XI ROBOTIC ASSISTED VENTRAL HERNIA, incisional;  Surgeon: Olean Ree, MD;  Location: [facility] ORS;  Service: General;  Laterality: N/A;   Incisional hernia    Family History  Problem Relation Age of Onset   Diabetes Mother    Cancer Mother        femal organs   Hypoparathyroidism Mother    Asthma Mother    Diabetes Father    Juvenile idiopathic arthritis Daughter    Diabetes Maternal Grandmother    Diabetes Maternal Grandfather    Leukemia Paternal Grandmother    Aneurysm Paternal Grandfather     Social History Social History   Tobacco Use   Smoking status: Never   Smokeless tobacco: Never  Vaping Use   Vaping Use: Never used  Substance Use Topics   Alcohol use: Not Currently    Comment: Social   Drug use: No    Allergies  Allergen Reactions   Cinnamon Swelling    Tongue swells up. With artificial (such as in gum/red hots)   Influenza Vaccines Hives    Hives two years in a row.  Hives two years in a row.     Current Facility-Administered Medications  Medication Dose Route Frequency Provider Last Rate Last Admin   acetaminophen (TYLENOL) tablet 650 mg  650 mg Oral Q6H PRN Athena Masse, MD       Or   acetaminophen (TYLENOL) suppository 650 mg  650 mg Rectal Q6H PRN Athena Masse, MD       albuterol (PROVENTIL) (2.5 MG/3ML) 0.083% nebulizer solution 2.5 mg  2.5 mg Inhalation Q4H PRN Athena Masse, MD       cefTRIAXone (ROCEPHIN) 1 g in sodium chloride 0.9 % 100 mL IVPB  1 g Intravenous Q24H Athena Masse, MD   Stopped at 02/01/23 0756   enoxaparin (LOVENOX) injection 72.5 mg  0.5 mg/kg Subcutaneous Q24H Athena Masse, MD       lactated ringers infusion   Intravenous Continuous Athena Masse, MD 125 mL/hr at 02/01/23 0448 New Bag at 02/01/23 0448   morphine (PF) 2 MG/ML injection 2 mg  2 mg Intravenous Q2H PRN Athena Masse, MD       ondansetron [facility]) tablet 4 mg  4 mg Oral Q6H PRN Athena Masse, MD       Or   ondansetron [facility]) injection 4 mg  4 mg Intravenous Q6H PRN Athena Masse, MD         Review of Systems Full ROS  was asked and was negative except for the information  on the HPI  Physical Exam Blood pressure 127/65, pulse 73, temperature 97.8 F (36.6 C), temperature source Oral, resp. rate 18, height 5\' 3"  (1.6 m), weight (!) 144.7 kg, last menstrual period 01/13/2023, SpO2 97 %. CONSTITUTIONAL: BMI 56. EYES: Pupils are equal, round, and r, Sclera are non-icteric. EARS, NOSE, MOUTH AND THROAT: The oropharynx is clear. The oral mucosa is pink and moist. Hearing is intact to voice. LYMPH NODES:  Lymph nodes in the neck are normal. RESPIRATORY:  Lungs are clear. There is normal respiratory effort, with equal breath sounds bilaterally, and without pathologic use of accessory muscles. CARDIOVASCULAR: Heart is regular without murmurs, gallops, or rubs. GI:  Large pannus.  Serous a prior laparotomy scar.  There is a reducible ventral hernia without peritonitis.  Abdomen is not tender. No masses.  There is no evidence of soft tissue infection  GU: Rectal deferred.   MUSCULOSKELETAL: Normal muscle strength and tone. No cyanosis or edema.   SKIN: Turgor is good and there are no pathologic skin lesions or ulcers. NEUROLOGIC: Motor and sensation is grossly normal. Cranial nerves are grossly intact. PSYCH:  Oriented to person, place and time. Affect is normal.  Data Reviewed  I have personally reviewed the patient's imaging, laboratory findings and medical records.    Assessment/Plan 37 year old female with complex close surgical history including single anastomosis duodenal switch.  Has improve her weight but continues to be significantly overweight.  Current BMI is 56.  He presents with intermittent obstructions and this is the third episode associated with a recurrent ventral hernia.  Had an extensive discussion with the patient and the husband.  Very difficult situation as I do think that she is suboptimal for definitive repair of ventral hernia.  Ideally I do think that she will likely benefit from panniculectomy in the setting of abdominal wall reconstruction  likely a tar release that we will provide the best chances to repair the ventral hernia.  However this has significant challenges specifically regarding coverage for the plastic surgery portion. At this point there is no need for any emergent surgical interventions.  I will be happy to follow her as an outpatient and potentially coordinate with our plastic surgery group and see if there is a possibility that insurance may be able to cover panniculectomy as I do think that is a medical necessity given the recurrences from his ventral hernias associated to the panniculus and the multiple issues that her pannus is having on her.  Please note  that I spent 75 minutes in this encounter including personally reviewing imaging studies, coordinating her care, placing orders and performing appropriate documentation   Caroleen Hamman, MD [facility] 02/01/2023, 2:21 PM

## 2023-02-01 NOTE — H&P (Signed)
History and Physical    Patient: Tanya Reeves [id number] DOB: 01-20-1986 DOA: 01/31/2023 DOS: the patient was seen and examined on 02/01/2023 PCP: Delsa Grana, PA-C  Patient coming from: Home  Chief Complaint:  Chief Complaint  Patient presents with   Abdominal Pain    HPI: Tanya Reeves is a 37 y.o. 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  She denies fever or chills, dysuria, vaginal discharge or bleeding.  She reports being on doxycycline to treat a flareup of her hidradenitis.  She was otherwise previously in her usual state of health. ED course and data review: Vitals unremarkable.  Labs notable for potassium of 3.4.  Hemoglobin at baseline at 10.5.  WBC normal. EKG, independently interpreted showing sinus rhythm with no acute ST-T wave changes. CT abdomen and pelvis showing low-grade SBO as follows: IMPRESSION: Infraumbilical ventral hernia containing small bowel loops. Small bowel proximal to the hernia are mildly dilated, likely reflecting low grade small bowel obstruction.  Patient treated with an IV fluid bolus, hydromorphone and Zofran.  She declined NG tube placement.  The ED provider spoke with surgeon, Dr. Duanne Limerick who requested medicine admit and will see patient in the a.m.   Review of Systems: As mentioned in the history of present illness. All other systems reviewed and are negative.  Past Medical History:   Diagnosis Date   Asthma    Chronic hip pain    Chronic sinusitis 10/02/2016   Essential hypertension, benign 02/26/2016   Gastroesophageal reflux disease without esophagitis 08/26/2018   Hypertension    Incisional hernia with obstruction but no gangrene    Migraines    Morbid obesity ([place]) 03/17/2016   Pre-existing hypertension during pregnancy in third trimester 09/23/2014   Shortness of breath on exertion 08/26/2016   Sleep apnea    Past Surgical History:  Procedure Laterality Date   CESAREAN SECTION     2   CHOLECYSTECTOMY     COLON SURGERY     part of bowel removed due to c-section   INSERTION OF MESH  10/17/2021   Procedure: INSERTION OF MESH;  Surgeon: Olean Ree, MD;  Location: [facility] ORS;  Service: General;;   sips     STOMACH SURGERY     XI ROBOTIC ASSISTED VENTRAL HERNIA N/A 10/17/2021   Procedure: XI ROBOTIC ASSISTED VENTRAL HERNIA, incisional;  Surgeon: Olean Ree, MD;  Location: [facility] ORS;  Service: General;  Laterality: N/A;  Incisional hernia   Social History:  reports that she has never smoked. She has never used smokeless tobacco. She reports that she does not currently use alcohol. She reports that she does not use drugs.  Allergies  Allergen Reactions   Cinnamon Swelling    Tongue swells up. With artificial (such as in gum/red hots)   Influenza Vaccines Hives    Hives two years in a row.  Hives two years in a row.     Family History  Problem Relation Age of Onset   Diabetes Mother    Cancer Mother  femal organs   Hypoparathyroidism Mother    Asthma Mother    Diabetes Father    Juvenile idiopathic arthritis Daughter    Diabetes Maternal Grandmother    Diabetes Maternal Grandfather    Leukemia Paternal Grandmother    Aneurysm Paternal Grandfather     Prior to Admission medications   Medication Sig Start Date End Date Taking? Authorizing Provider  albuterol (PROAIR HFA) 108 (90 Base) MCG/ACT inhaler Inhale 2 puffs into the lungs every  4 (four) hours as needed. 12/31/22 12/31/23  Delsa Grana, PA-C  eletriptan (RELPAX) 20 MG tablet TAKE 1 TABLET BY MOUTH AS NEEDED FOR MIGRAINE OR HEADACHE. MAY REPEAT IN 2 HOURS IF HEADACHE PERSISTS OR RECURS 12/31/22   Delsa Grana, PA-C  ferrous sulfate 325 (65 FE) MG tablet Take by mouth.    [provider]  Multiple Vitamins-Minerals (BARIATRIC MULTIVITAMINS/IRON) CAPS Take 1 capsule by mouth daily.    [provider]  ondansetron (ZOFRAN ODT) 4 MG disintegrating tablet Take 1 tablet (4 mg total) by mouth every 8 (eight) hours as needed for nausea or vomiting. 12/31/22   Delsa Grana, PA-C    Physical Exam: Vitals:   01/31/23 2211 01/31/23 2327 02/01/23 0015 02/01/23 0100  BP: (!) 155/92 126/73 110/60 120/62  Pulse: 78 77 73 75  Resp: 20 19 11 17   Temp: 98.5 F (36.9 C)   98.7 F (37.1 C)  TempSrc: Oral   Oral  SpO2: 97% 98% 97% 97%  Weight: (!) 145.2 kg     Height: 5\' 3"  (1.6 m)      Physical Exam Vitals and nursing note reviewed.  Constitutional:      General: She is not in acute distress. HENT:     Head: Normocephalic and atraumatic.  Cardiovascular:     Rate and Rhythm: Normal rate and regular rhythm.     Heart sounds: Normal heart sounds.  Pulmonary:     Effort: Pulmonary effort is normal.     Breath sounds: Normal breath sounds.  Abdominal:     Palpations: Abdomen is soft.     Tenderness: There is abdominal tenderness in the periumbilical area.     Comments: Redundant pannus from weight loss related to bariatric   Neurological:     Mental Status: Mental status is at baseline.     Labs on Admission: I have personally reviewed following labs and imaging studies  CBC: Recent Labs  Lab 01/31/23 2305  WBC 8.2  NEUTROABS 4.6  HGB 10.5*  HCT 34.2*  MCV 79.7*  PLT 123456   Basic Metabolic Panel: Recent Labs  Lab 01/31/23 2305  NA 139  K 3.4*  CL 106  CO2 24  GLUCOSE 120*  BUN 13  CREATININE 0.47  CALCIUM 8.9   GFR: Estimated  Creatinine Clearance: 134.7 mL/min (by C-G formula based on SCr of 0.47 mg/dL). Liver Function Tests: Recent Labs  Lab 01/31/23 2305  AST 16  ALT 21  ALKPHOS 54  BILITOT 0.5  PROT 7.3  ALBUMIN 3.5   Recent Labs  Lab 01/31/23 2305  LIPASE 46   No results for input(s): "AMMONIA" in the last 168 hours. Coagulation Profile: No results for input(s): "INR", "PROTIME" in the last 168 hours. Cardiac Enzymes: No results for input(s): "CKTOTAL", "CKMB", "CKMBINDEX", "TROPONINI" in the last 168 hours. BNP (last 3 results) No results for input(s): "PROBNP" in the last 8760 hours. HbA1C: No results for input(s): "HGBA1C" in the last 72 hours. CBG: No results for input(s): "GLUCAP" in  the last 168 hours. Lipid Profile: No results for input(s): "CHOL", "HDL", "LDLCALC", "TRIG", "CHOLHDL", "LDLDIRECT" in the last 72 hours. Thyroid Function Tests: No results for input(s): "TSH", "T4TOTAL", "FREET4", "T3FREE", "THYROIDAB" in the last 72 hours. Anemia Panel: No results for input(s): "VITAMINB12", "FOLATE", "FERRITIN", "TIBC", "IRON", "RETICCTPCT" in the last 72 hours. Urine analysis:    Component Value Date/Time   COLORURINE YELLOW (A) 01/31/2023 2305   APPEARANCEUR HAZY (A) 01/31/2023 2305   APPEARANCEUR Cloudy (A) 02/26/2016 1038   LABSPEC 1.029 01/31/2023 2305   LABSPEC 1.020 04/10/2013 1357   PHURINE 6.0 01/31/2023 2305   GLUCOSEU NEGATIVE 01/31/2023 2305   GLUCOSEU Negative 04/10/2013 1357   HGBUR NEGATIVE 01/31/2023 2305   BILIRUBINUR NEGATIVE 01/31/2023 2305   BILIRUBINUR Negative 02/26/2016 1038   BILIRUBINUR Negative 04/10/2013 1357   KETONESUR NEGATIVE 01/31/2023 2305   PROTEINUR 30 (A) 01/31/2023 2305   NITRITE NEGATIVE 01/31/2023 2305   LEUKOCYTESUR NEGATIVE 01/31/2023 2305   LEUKOCYTESUR 1+ 04/10/2013 1357    Radiological Exams on Admission: CT ABDOMEN PELVIS W CONTRAST  Result Date: 02/01/2023 CLINICAL DATA:  Abdominal pain EXAM: CT ABDOMEN AND PELVIS WITH  CONTRAST TECHNIQUE: Multidetector CT imaging of the abdomen and pelvis was performed using the standard protocol following bolus administration of intravenous contrast. RADIATION DOSE REDUCTION: This exam was performed according to the departmental dose-optimization program which includes automated exposure control, adjustment of the mA and/or kV according to patient size and/or use of iterative reconstruction technique. CONTRAST:  193mL OMNIPAQUE IOHEXOL 300 MG/ML  SOLN COMPARISON:  10/04/2021 FINDINGS: Lower chest: Fall no acute abnormality Hepatobiliary: No focal liver abnormality is seen. Status post cholecystectomy. No biliary dilatation. Pancreas: No focal abnormality or ductal dilatation. Spleen: Stable 16 mm low-density lesion in the spleen, likely cyst. Normal size. Adrenals/Urinary Tract: No adrenal abnormality. No focal renal abnormality. No stones or hydronephrosis. Urinary bladder is unremarkable. Stomach/Bowel: Normal appendix. There is a lower abdominal wall ventral hernia which contains small bowel loops. Small bowel proximal to the hernia are mildly dilated, likely low grade small bowel obstruction. Stomach and large bowel unremarkable. Vascular/Lymphatic: No evidence of aneurysm or adenopathy. Reproductive: Uterus and adnexa unremarkable.  No mass. Other: No free fluid or free air. Musculoskeletal: No acute bony abnormality. IMPRESSION: Infraumbilical ventral hernia containing small bowel loops. Small bowel proximal to the hernia are mildly dilated, likely reflecting low grade small bowel obstruction. Electronically Signed   By: Rolm Baptise M.D.   On: 02/01/2023 00:04     Data Reviewed: Relevant notes from primary care and specialist visits, past discharge summaries as available in EHR, including Care Everywhere. Prior diagnostic testing as pertinent to current admission diagnoses Updated medications and problem lists for reconciliation ED course, including vitals, labs, imaging, treatment  and response to treatment Triage notes, nursing and pharmacy notes and ED provider's notes Notable results as noted in HPI   Assessment and Plan: * Small bowel obstruction ([place]) History of prior small bowel obstruction S/p ventral hernia repair 2022 N.p.o. IV fluids, IV antiemetics and IV pain meds Surgical consult to follow Patient declined NG tube.  She was counseled on risks of aspiration  IDA (iron deficiency anemia) Hemoglobin at baseline Gets weekly Venofer  Asthma Albuterol as needed  Hidradenitis suppurativa Currently on doxycycline WBC normal, afebrile.  Will hold off on antibiotics  Class 3 severe obesity with body mass index (BMI) of 50.0 to 59.9 in adult [facility]) S/p duodenal switch in 2020 Might benefit from new inject double weight loss agents in  addition to lifestyle modification  Migraines Takes Relpax Toradol and Reglan as needed IV while n.p.o.    DVT prophylaxis: Lovenox  Consults: Surgery, Dr. Duanne Limerick  Advance Care Planning:   Code Status: Prior   Family Communication: none  Disposition Plan: Back to previous home environment  Severity of Illness: The appropriate patient status for this patient is INPATIENT. Inpatient status is judged to be reasonable and necessary in order to provide the required intensity of service to ensure the patient's safety. The patient's presenting symptoms, physical exam findings, and initial radiographic and laboratory data in the context of their chronic comorbidities is felt to place them at high risk for further clinical deterioration. Furthermore, it is not anticipated that the patient will be medically stable for discharge from the hospital within 2 midnights of admission.   * I certify that at the point of admission it is my clinical judgment that the patient will require inpatient hospital care spanning beyond 2 midnights from the point of admission due to high intensity of service, high risk for further  deterioration and high frequency of surveillance required.*  Author: Athena Masse, MD 02/01/2023 2:26 AM  For on call review [email or web address].

## 2023-02-01 NOTE — Assessment & Plan Note (Addendum)
History of prior small bowel obstruction S/p ventral hernia repair 2022 N.p.o. IV fluids, IV antiemetics and IV pain meds Surgical consult to follow Patient declined NG tube.  She was counseled on risks of aspiration. States she might reconsider

## 2023-02-01 NOTE — Assessment & Plan Note (Addendum)
Currently on doxycycline started 01/27/23 WBC normal, afebrile.   Patient has active hidradenitis lesions in the perineum Rocephin to replace doxycycline while npo for 2-3 more days

## 2023-02-01 NOTE — Plan of Care (Signed)
37 y.o. 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  Refused NG.  Patient evaluated this morning was on overnight CPAP.  Symptomatically feeling better.    -H&P reviewed assessment and plan continued as documented by the admitting physician.   -Surgery eval pending at the time of note.

## 2023-02-01 NOTE — Progress Notes (Signed)
Anticoagulation monitoring(Lovenox):  37 yo  female ordered Lovenox 40 mg Q24h    Filed Weights   01/31/23 2211  Weight: (!) 145.2 kg (320 lb)   BMI 56.7   Lab Results  Component Value Date   CREATININE 0.47 01/31/2023   CREATININE 0.43 (L) 12/31/2022   CREATININE 0.47 (L) 02/06/2022   Estimated Creatinine Clearance: 134.7 mL/min (by C-G formula based on SCr of 0.47 mg/dL). Hemoglobin & Hematocrit     Component Value Date/Time   HGB 10.5 (L) 01/31/2023 2305   HGB 12.7 02/26/2016 1038   HCT 34.2 (L) 01/31/2023 2305   HCT 38.8 02/26/2016 1038     Per Protocol for Patient with estCrcl > 30 ml/min and BMI > 30, will transition to Lovenox 72.5 mg Q24h.

## 2023-02-01 NOTE — Assessment & Plan Note (Addendum)
Takes Relpax Toradol and Reglan as needed IV while n.p.o.

## 2023-02-01 NOTE — Assessment & Plan Note (Signed)
Hemoglobin at baseline Gets weekly Venofer

## 2023-02-02 ENCOUNTER — Inpatient Hospital Stay: Payer: 59

## 2023-02-02 DIAGNOSIS — K56600 Partial intestinal obstruction, unspecified as to cause: Secondary | ICD-10-CM

## 2023-02-02 DIAGNOSIS — J4520 Mild intermittent asthma, uncomplicated: Secondary | ICD-10-CM

## 2023-02-02 DIAGNOSIS — G4733 Obstructive sleep apnea (adult) (pediatric): Secondary | ICD-10-CM

## 2023-02-02 DIAGNOSIS — K430 Incisional hernia with obstruction, without gangrene: Secondary | ICD-10-CM | POA: Diagnosis not present

## 2023-02-02 DIAGNOSIS — K56609 Unspecified intestinal obstruction, unspecified as to partial versus complete obstruction: Secondary | ICD-10-CM | POA: Diagnosis not present

## 2023-02-02 LAB — CBC
HCT: 28.6 % — ABNORMAL LOW (ref 36.0–46.0)
Hemoglobin: 9 g/dL — ABNORMAL LOW (ref 12.0–15.0)
MCH: 25 pg — ABNORMAL LOW (ref 26.0–34.0)
MCHC: 31.5 g/dL (ref 30.0–36.0)
MCV: 79.4 fL — ABNORMAL LOW (ref 80.0–100.0)
Platelets: 220 10*3/uL (ref 150–400)
RBC: 3.6 MIL/uL — ABNORMAL LOW (ref 3.87–5.11)
RDW: 17.3 % — ABNORMAL HIGH (ref 11.5–15.5)
WBC: 5.9 10*3/uL (ref 4.0–10.5)
nRBC: 0 % (ref 0.0–0.2)

## 2023-02-02 LAB — BASIC METABOLIC PANEL
Anion gap: 5 (ref 5–15)
BUN: <5 mg/dL — ABNORMAL LOW (ref 6–20)
CO2: 24 mmol/L (ref 22–32)
Calcium: 8.2 mg/dL — ABNORMAL LOW (ref 8.9–10.3)
Chloride: 107 mmol/L (ref 98–111)
Creatinine, Ser: 0.43 mg/dL — ABNORMAL LOW (ref 0.44–1.00)
GFR, Estimated: >60 mL/min (ref >60)
Glucose, Bld: 112 mg/dL — ABNORMAL HIGH (ref 70–99)
Potassium: 3.2 mmol/L — ABNORMAL LOW (ref 3.5–5.1)
Sodium: 136 mmol/L (ref 135–145)

## 2023-02-02 LAB — MAGNESIUM: Magnesium: 1.9 mg/dL (ref 1.7–2.4)

## 2023-02-02 LAB — PHOSPHORUS: Phosphorus: 3.6 mg/dL (ref 2.5–4.6)

## 2023-02-02 NOTE — Progress Notes (Signed)
Progress Note   Patient: Tanya Reeves [id number] DOB: [birth year] DOA: 01/31/2023     1 DOS: the patient was seen and examined on 02/02/2023   Brief hospital course:  RANDE KREISHER is a 37 y.o. 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  She denies fever or chills, dysuria, vaginal discharge or bleeding.  She reports being on doxycycline to treat a flareup of her hidradenitis.  She was otherwise previously in her usual state of health.  ED course and data review: Vitals unremarkable.  Labs notable for potassium of 3.4.  Hemoglobin at baseline at 10.5.  WBC normal.EKG, independently interpreted showing sinus rhythm with no acute ST-T wave changes.  CT abdomen and pelvis showing low-grade SBO as follows: Infraumbilical ventral hernia containing small bowel loops. Small bowel proximal to the hernia are mildly dilated, likely reflecting ow grade small bowel obstruction.   Patient treated with an IV fluid bolus, hydromorphone and Zofran.  She declined NG tube placement.  The ED provider spoke with surgeon, Dr. Duanne Limerick who requested medicine admit and will see patient in the a.m.   3/17 : Patient symptomatically feeling better was advanced to clear liquid diet.  Surgery evaluated the patient recommends advancing to full liquid.  No surgical intervention on this admission.  Patient pending discharge tolerating full liquid diet.  Possible  discharge tomorrow  Assessment and Plan: * Intestinal obstruction ([place]) History of prior small bowel obstruction S/p ventral hernia repair 2022 N.p.o. IV fluids, IV antiemetics and IV pain meds Surgical consult to follow Patient declined NG tube.  She was counseled on risks of aspiration. States she might reconsider  IDA (iron deficiency anemia) Hemoglobin at baseline Gets weekly Venofer  Asthma Albuterol as needed  Hidradenitis suppurativa Currently on doxycycline started 01/27/23 WBC normal, afebrile.   Patient has active hidradenitis lesions in the perineum Rocephin to replace doxycycline while npo for 2-3 more days  Class 3 severe obesity with body mass index (BMI) of 50.0 to 59.9 in adult [facility]) S/p duodenal switch in 2020 Might benefit from new inject double weight loss agents in addition to lifestyle modification  Migraines Takes Relpax Toradol and Reglan as needed IV while n.p.o.     Subjective: Patient seen and examined this morning.  Symptomatic feeling better.  Was eating at the time of my evaluation.  Able to tolerate clear liquid diet.  Patient plan to advance to full liquid diet.  Surgery with no acute intervention plan for discharge tomorrow  Physical Exam: Vitals:   02/01/23 1601 02/01/23 1910 02/02/23 0411 02/02/23 0739  BP: 119/64 (!) 142/64 120/70 130/75  Pulse: 71 71 64 65  Resp: 18 20 16 20   Temp: 98 F (36.7 C) 97.8 F (36.6 C) 98 F (36.7 C) 97.7 F (36.5 C)  TempSrc: Oral Oral Oral   SpO2: 96% 98% 97% 99%  Weight:      Height:       Physical Exam Constitutional:      Appearance: Normal appearance.  HENT:  Head: Normocephalic and atraumatic.     Mouth/Throat:     Mouth: Mucous membranes are moist.  Eyes:     Pupils: Pupils are equal, round, and reactive to light.  Cardiovascular:     Rate and Rhythm: Normal rate and regular rhythm.     Heart sounds: No murmur heard. Pulmonary:     Effort: Pulmonary effort is normal.  Abdominal:      General: Abdomen is protuberant. Bowel sounds are normal. There is distension.     Palpations: Abdomen is soft.     Tenderness: There is no abdominal tenderness.     Hernia: A hernia is present.  Musculoskeletal:        General: Normal range of motion.     Cervical back: Normal range of motion.  Skin:    General: Skin is warm.  Neurological:     General: No focal deficit present.     Mental Status: She is alert and oriented to person, place, and time.  Psychiatric:        Mood and Affect: Mood normal.        Behavior: Behavior normal.        Thought Content: Thought content normal.     Data Reviewed:  There are no new results to review at this time.  Family Communication: None   Disposition: Status is: Inpatient Remains inpatient appropriate because: SBO  Planned Discharge Destination: Home    Time spent: 35 minutes  Author: Oran Rein, MD 02/02/2023 2:02 PM  For on call review [email or web address].

## 2023-02-02 NOTE — Progress Notes (Signed)
CC: ileus Subjective: Feeling much better, tolerating CLD Kub pers reviewed  w nromal gas pattern, no free air   Objective: Vital signs in last 24 hours: Temp:  [97.7 F (36.5 C)-98 F (36.7 C)] 97.7 F (36.5 C) (03/17 0739) Pulse Rate:  [64-71] 65 (03/17 0739) Resp:  [16-20] 20 (03/17 0739) BP: (119-142)/(64-75) 130/75 (03/17 0739) SpO2:  [96 %-99 %] 99 % (03/17 0739) Last BM Date : 02/01/23  Intake/Output from previous day: 03/16 0701 - 03/17 0700 In: 1824.5 [P.O.:720; I.V.:1011.6; IV Piggyback:92.9] Out: 850 [Urine:850] Intake/Output this shift: Total I/O In: 480 [P.O.:480] Out: -   Physical exam: CONSTITUTIONAL: BMI 56. EARS, NOSE, MOUTH AND THROAT:  The oral mucosa is pink and moist. Hearing is intact to voice. LYMPH NODES:  Lymph nodes in the neck are normal. GI:  Large pannus.  Serous a prior laparotomy scar.  There is a reducible ventral hernia without peritonitis.  Abdomen is not tender. No masses.  There is no evidence of soft tissue infection NEUROLOGIC: Motor and sensation is grossly normal. Cranial nerves are grossly intact. PSYCH:  Oriented to person, place and time. Affect is normal.     Lab Results: CBC  Recent Labs    02/01/23 0500 02/02/23 0601  WBC 7.4 5.9  HGB 9.7* 9.0*  HCT 31.3* 28.6*  PLT 236 220   BMET Recent Labs    01/31/23 2305 02/01/23 0500 02/02/23 0601  NA 139  --  136  K 3.4*  --  3.2*  CL 106  --  107  CO2 24  --  24  GLUCOSE 120*  --  112*  BUN 13  --  <5*  CREATININE 0.47 0.43* 0.43*  CALCIUM 8.9  --  8.2*   PT/INR No results for input(s): "LABPROT", "INR" in the last 72 hours. ABG No results for input(s): "PHART", "HCO3" in the last 72 hours.  Invalid input(s): "PCO2", "PO2"  Studies/Results: DG ABD ACUTE 2+V W 1V CHEST  Result Date: 02/02/2023 CLINICAL DATA:  Postoperative ileus EXAM: DG ABDOMEN ACUTE WITH 1 VIEW CHEST COMPARISON:  02/01/2023 FINDINGS: Cardiac shadow is within normal limits. Lungs are well  aerated bilaterally. No focal infiltrate or effusion is seen. No free air is seen. Postsurgical changes are noted. No obstructive changes are seen. No significant gaseous dilatation is noted. No bony abnormality is seen. IMPRESSION: No findings to suggest postoperative ileus. No acute abnormality seen. Electronically Signed   By: Inez Catalina M.D.   On: 02/02/2023 11:07   DG ABD ACUTE 2+V W 1V CHEST  Result Date: 02/01/2023 CLINICAL DATA:  Ileus EXAM: DG ABDOMEN ACUTE WITH 1 VIEW CHEST COMPARISON:  10/10/2021 FINDINGS: Prior cholecystectomy. Nonobstructive bowel gas pattern. No free air, organomegaly or suspicious calcification. Heart and mediastinal contours are within normal limits. No focal opacities or effusions. No acute bony abnormality. IMPRESSION: No acute findings. Electronically Signed   By: Rolm Baptise M.D.   On: 02/01/2023 20:06   CT ABDOMEN PELVIS W CONTRAST  Result Date: 02/01/2023 CLINICAL DATA:  Abdominal pain EXAM: CT ABDOMEN AND PELVIS WITH CONTRAST TECHNIQUE: Multidetector CT imaging of the abdomen and pelvis was performed using the standard protocol following bolus administration of intravenous contrast. RADIATION DOSE REDUCTION: This exam was performed according to the departmental dose-optimization program which includes automated exposure control, adjustment of the mA and/or kV according to patient size and/or use of iterative reconstruction technique. CONTRAST:  134mL OMNIPAQUE IOHEXOL 300 MG/ML  SOLN COMPARISON:  10/04/2021 FINDINGS: Lower chest: Fall no  acute abnormality Hepatobiliary: No focal liver abnormality is seen. Status post cholecystectomy. No biliary dilatation. Pancreas: No focal abnormality or ductal dilatation. Spleen: Stable 16 mm low-density lesion in the spleen, likely cyst. Normal size. Adrenals/Urinary Tract: No adrenal abnormality. No focal renal abnormality. No stones or hydronephrosis. Urinary bladder is unremarkable. Stomach/Bowel: Normal appendix. There is a  lower abdominal wall ventral hernia which contains small bowel loops. Small bowel proximal to the hernia are mildly dilated, likely low grade small bowel obstruction. Stomach and large bowel unremarkable. Vascular/Lymphatic: No evidence of aneurysm or adenopathy. Reproductive: Uterus and adnexa unremarkable.  No mass. Other: No free fluid or free air. Musculoskeletal: No acute bony abnormality. IMPRESSION: Infraumbilical ventral hernia containing small bowel loops. Small bowel proximal to the hernia are mildly dilated, likely reflecting low grade small bowel obstruction. Electronically Signed   By: Rolm Baptise M.D.   On: 02/01/2023 00:04    Anti-infectives: Anti-infectives (From admission, onward)    Start     Dose/Rate Route Frequency Ordered Stop   02/01/23 0300  cefTRIAXone (ROCEPHIN) 1 g in sodium chloride 0.9 % 100 mL IVPB        1 g 200 mL/hr over 30 Minutes Intravenous Every 24 hours 02/01/23 0257         Assessment/Plan:  Ileus resolving Advance to full liquids DC within 24 hrs No immediate surgical intervention required Please note that I spent 35 minutes in this encounter including personally reviewing imaging studies, coordinating her care, placing orders and performing appropriate documentation   Caroleen Hamman, MD, FACS  02/02/2023

## 2023-02-03 ENCOUNTER — Inpatient Hospital Stay: Payer: 59

## 2023-02-03 MED ORDER — SODIUM CHLORIDE 0.9 % IV SOLN
200.0000 mg | Freq: Once | INTRAVENOUS | Status: AC
  Administered 2023-02-03: 200 mg via INTRAVENOUS
  Filled 2023-02-03: qty 200

## 2023-02-03 NOTE — Discharge Summary (Signed)
Tanya Reeves [id number] DOB: 1985/11/27 DOA: 01/31/2023  PCP: Delsa Grana, PA-C  Admit date: 01/31/2023 Discharge date: 02/03/2023  Time spent: 35 minutes  Recommendations for Outpatient Follow-up:  Gen surg f/u     Discharge Diagnoses:  Principal Problem:   Intestinal obstruction ([place]) Active Problems:   History of small bowel obstruction   Migraines   OSA on CPAP   Class 3 severe obesity with body mass index (BMI) of 50.0 to 59.9 in adult [facility])   Hidradenitis suppurativa   Asthma   Status post biliopancreatic diversion with duodenal switch   IDA (iron deficiency anemia)   Discharge Condition: stable  Diet recommendation: heart healthy  Filed Weights   01/31/23 2211 02/01/23 0417  Weight: (!) 145.2 kg (!) 144.7 kg    History of present illness:  From admission h and p Tanya Reeves is a 37 y.o. 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  She denies fever or chills, dysuria, vaginal discharge or bleeding.  She reports being on doxycycline to treat a flareup of her hidradenitis.  She was otherwise previously in her usual state of health.    Hospital Course:  Patient presents with abdominal pain. She has a history of bariatric surgery with duodenal switch, has also had ventral hernia repair with mesh. CT here showed SBO secondary to ventral hernia. This resolved spontaneously, no need for NG tube, pain free on day of  discharge and tolerating diet. Seen by gen surg, they advise outpatient f/u for ventral hernia repair, will likely require panniculectomy with plastic surgery involvement. Of note patient is followed by hematology as an outpatient, due today for 4th of 4 IV iron infusion, we will give her that infusion prior to discharge.  Procedures: none   Consultations: Gen surg  Discharge Exam: Vitals:   02/02/23 1917 02/03/23 0500  BP: 130/75 129/67  Pulse: 65 65  Resp: 20 16  Temp: 98.5 F (36.9 C) 98.1 F (36.7 C)  SpO2: 96% 99%    General: NAD Cardiovascular: RRR Respiratory: CTAB Abdomen: soft, non-tender  Discharge Instructions   Discharge Instructions     Diet - low sodium heart healthy   Complete by: As directed    Increase activity slowly   Complete by: As directed       Allergies as of 02/03/2023       Reactions   Cinnamon Swelling   Tongue swells up. With artificial (such as in gum/red hots)   Influenza Vaccines Hives   Hives two years in a row.  Hives two years in a row.         Medication List     TAKE these medications    albuterol 108 (90 Base) MCG/ACT inhaler Commonly known as: ProAir HFA Inhale 2 puffs into the lungs every 4 (four) hours as needed.   Bariatric Multivitamins/Iron Caps Take 1 capsule by mouth daily.   doxycycline 100 MG tablet Commonly known as: ADOXA Take 100 mg by mouth 2 (two) times daily.   eletriptan 20  MG tablet Commonly known as: RELPAX TAKE 1 TABLET BY MOUTH AS NEEDED FOR MIGRAINE OR HEADACHE. MAY REPEAT IN 2 HOURS IF HEADACHE PERSISTS OR RECURS   ferrous sulfate 325 (65 FE) MG tablet Take by mouth.   ondansetron 4 MG disintegrating tablet Commonly known as: Zofran ODT Take 1 tablet (4 mg total) by mouth every 8 (eight) hours as needed for nausea or vomiting.       Allergies  Allergen Reactions   Cinnamon Swelling    Tongue swells up. With artificial (such as in gum/red hots)   Influenza Vaccines Hives     Hives two years in a row.  Hives two years in a row.     Follow-up Information     [place], [place], MD Follow up on 02/10/2023.   Specialty: General Surgery Contact information: [address] [place] [address] [phone]                  The results of significant diagnostics from this hospitalization (including imaging, microbiology, ancillary and laboratory) are listed below for reference.    Significant Diagnostic Studies: DG ABD ACUTE 2+V W 1V CHEST  Result Date: 02/02/2023 CLINICAL DATA:  Postoperative ileus EXAM: DG ABDOMEN ACUTE WITH 1 VIEW CHEST COMPARISON:  02/01/2023 FINDINGS: Cardiac shadow is within normal limits. Lungs are well aerated bilaterally. No focal infiltrate or effusion is seen. No free air is seen. Postsurgical changes are noted. No obstructive changes are seen. No significant gaseous dilatation is noted. No bony abnormality is seen. IMPRESSION: No findings to suggest postoperative ileus. No acute abnormality seen. Electronically Signed   By: Inez Catalina M.D.   On: 02/02/2023 11:07   DG ABD ACUTE 2+V W 1V CHEST  Result Date: 02/01/2023 CLINICAL DATA:  Ileus EXAM: DG ABDOMEN ACUTE WITH 1 VIEW CHEST COMPARISON:  10/10/2021 FINDINGS: Prior cholecystectomy. Nonobstructive bowel gas pattern. No free air, organomegaly or suspicious calcification. Heart and mediastinal contours are within normal limits. No focal opacities or effusions. No acute bony abnormality. IMPRESSION: No acute findings. Electronically Signed   By: Rolm Baptise M.D.   On: 02/01/2023 20:06   CT ABDOMEN PELVIS W CONTRAST  Result Date: 02/01/2023 CLINICAL DATA:  Abdominal pain EXAM: CT ABDOMEN AND PELVIS WITH CONTRAST TECHNIQUE: Multidetector CT imaging of the abdomen and pelvis was performed using the standard protocol following bolus administration of intravenous contrast. RADIATION DOSE REDUCTION: This exam was performed according to the departmental dose-optimization  program which includes automated exposure control, adjustment of the mA and/or kV according to patient size and/or use of iterative reconstruction technique. CONTRAST:  133mL OMNIPAQUE IOHEXOL 300 MG/ML  SOLN COMPARISON:  10/04/2021 FINDINGS: Lower chest: Fall no acute abnormality Hepatobiliary: No focal liver abnormality is seen. Status post cholecystectomy. No biliary dilatation. Pancreas: No focal abnormality or ductal dilatation. Spleen: Stable 16 mm low-density lesion in the spleen, likely cyst. Normal size. Adrenals/Urinary Tract: No adrenal abnormality. No focal renal abnormality. No stones or hydronephrosis. Urinary bladder is unremarkable. Stomach/Bowel: Normal appendix. There is a lower abdominal wall ventral hernia which contains small bowel loops. Small bowel proximal to the hernia are mildly dilated, likely low grade small bowel obstruction. Stomach and large bowel unremarkable. Vascular/Lymphatic: No evidence of aneurysm or adenopathy. Reproductive: Uterus and adnexa unremarkable.  No mass. Other: No free fluid or free air. Musculoskeletal: No acute bony abnormality. IMPRESSION: Infraumbilical ventral hernia containing small bowel loops. Small bowel proximal to the hernia are mildly dilated, likely reflecting low grade small  bowel obstruction. Electronically Signed   By: Rolm Baptise M.D.   On: 02/01/2023 00:04    Microbiology: Recent Results (from the past 240 hour(s))  Resp panel by RT-PCR (RSV, Flu A&B, Covid) Urine, Clean Catch     Status: None   Collection Time: 01/31/23 11:05 PM   Specimen: Urine, Clean Catch; Nasal Swab  Result Value Ref Range Status   SARS Coronavirus 2 by RT PCR NEGATIVE NEGATIVE Final    Comment: (NOTE) SARS-CoV-2 target nucleic acids are NOT DETECTED.  The SARS-CoV-2 RNA is generally detectable in upper respiratory specimens during the acute phase of infection. The lowest concentration of SARS-CoV-2 viral copies this assay can detect is 138 copies/mL. A  negative result does not preclude SARS-Cov-2 infection and should not be used as the sole basis for treatment or other patient management decisions. A negative result may occur with  improper specimen collection/handling, submission of specimen other than nasopharyngeal swab, presence of viral mutation(s) within the areas targeted by this assay, and inadequate number of viral copies(<138 copies/mL). A negative result must be combined with clinical observations, patient history, and epidemiological information. The expected result is Negative.  Fact Sheet for Patients:  EntrepreneurPulse.com.au  Fact Sheet for Healthcare Providers:  IncredibleEmployment.be  This test is no t yet approved or cleared by the [place] FDA and  has been authorized for detection and/or diagnosis of SARS-CoV-2 by FDA under an Emergency Use Authorization (EUA). This EUA will remain  in effect (meaning this test can be used) for the duration of the COVID-19 declaration under Section 564(b)(1) of the Act, 21 U.S.C.section 360bbb-3(b)(1), unless the authorization is terminated  or revoked sooner.       Influenza A by PCR NEGATIVE NEGATIVE Final   Influenza B by PCR NEGATIVE NEGATIVE Final    Comment: (NOTE) The Xpert Xpress SARS-CoV-2/FLU/RSV plus assay is intended as an aid in the diagnosis of influenza from Nasopharyngeal swab specimens and should not be used as a sole basis for treatment. Nasal washings and aspirates are unacceptable for Xpert Xpress SARS-CoV-2/FLU/RSV testing.  Fact Sheet for Patients: EntrepreneurPulse.com.au  Fact Sheet for Healthcare Providers: IncredibleEmployment.be  This test is not yet approved or cleared by the [place] FDA and has been authorized for detection and/or diagnosis of SARS-CoV-2 by FDA under an Emergency Use Authorization (EUA). This EUA will remain in effect (meaning this test can  be used) for the duration of the COVID-19 declaration under Section 564(b)(1) of the Act, 21 U.S.C. section 360bbb-3(b)(1), unless the authorization is terminated or revoked.     Resp Syncytial Virus by PCR NEGATIVE NEGATIVE Final    Comment: (NOTE) Fact Sheet for Patients: EntrepreneurPulse.com.au  Fact Sheet for Healthcare Providers: IncredibleEmployment.be  This test is not yet approved or cleared by the [place] FDA and has been authorized for detection and/or diagnosis of SARS-CoV-2 by FDA under an Emergency Use Authorization (EUA). This EUA will remain in effect (meaning this test can be used) for the duration of the COVID-19 declaration under Section 564(b)(1) of the Act, 21 U.S.C. section 360bbb-3(b)(1), unless the authorization is terminated or revoked.  Performed at [facility], [place], [place] [address]   Group A Strep by PCR [facility] Only)     Status: None   Collection Time: 01/31/23 11:05 PM   Specimen: Urine, Clean Catch; Sterile Swab  Result Value Ref Range Status   Group A Strep by PCR NOT DETECTED NOT DETECTED Final    Comment: Performed  at [facility], [address] [place], [place] [address]     Labs: Basic Metabolic Panel: Recent Labs  Lab 01/31/23 2305 02/01/23 0500 02/02/23 0601  NA 139  --  136  K 3.4*  --  3.2*  CL 106  --  107  CO2 24  --  24  GLUCOSE 120*  --  112*  BUN 13  --  <5*  CREATININE 0.47 0.43* 0.43*  CALCIUM 8.9  --  8.2*  MG  --   --  1.9  PHOS  --   --  3.6   Liver Function Tests: Recent Labs  Lab 01/31/23 2305  AST 16  ALT 21  ALKPHOS 54  BILITOT 0.5  PROT 7.3  ALBUMIN 3.5   Recent Labs  Lab 01/31/23 2305  LIPASE 46   No results for input(s): "AMMONIA" in the last 168 hours. CBC: Recent Labs  Lab 01/31/23 2305 02/01/23 0500 02/02/23 0601  WBC 8.2 7.4 5.9  NEUTROABS 4.6  --   --   HGB 10.5* 9.7* 9.0*  HCT 34.2* 31.3* 28.6*   MCV 79.7* 78.8* 79.4*  PLT 260 236 220   Cardiac Enzymes: No results for input(s): "CKTOTAL", "CKMB", "CKMBINDEX", "TROPONINI" in the last 168 hours. BNP: BNP (last 3 results) No results for input(s): "BNP" in the last 8760 hours.  ProBNP (last 3 results) No results for input(s): "PROBNP" in the last 8760 hours.  CBG: No results for input(s): "GLUCAP" in the last 168 hours.     Signed:  Desma Maxim MD.  [facility] 02/03/2023, 10:53 AM

## 2023-02-03 NOTE — TOC Initial Note (Signed)
Transition of Care [facility]) - Initial/Assessment Note    Patient Details  Name: Tanya Reeves MRN: [id number] Date of Birth: 1986/01/01  Transition of Care [facility]) CM/SW Contact:    Beverly Sessions, RN Phone Number: 02/03/2023, 11:38 AM  Clinical Narrative:                   Transition of Care (TOC) Screening Note   Patient Details  Name: Tanya Reeves Date of Birth: 20-Jun-1986   Transition of Care [facility]) CM/SW Contact:    Beverly Sessions, RN Phone Number: 02/03/2023, 11:38 AM    Transition of Care Department [facility]) has reviewed patient and no TOC needs have been identified at this time. We will continue to monitor patient advancement through interdisciplinary progression rounds. If new patient transition needs arise, please place a TOC consult.         Patient Goals and CMS Choice            Expected Discharge Plan and Services         Expected Discharge Date: 02/03/23                                    Prior Living Arrangements/Services                       Activities of Daily Living Home Assistive Devices/Equipment: CPAP, Eyeglasses ADL Screening (condition at time of admission) Patient's cognitive ability adequate to safely complete daily activities?: Yes Is the patient deaf or have difficulty hearing?: No Does the patient have difficulty seeing, even when wearing glasses/contacts?: No Does the patient have difficulty concentrating, remembering, or making decisions?: No Patient able to express need for assistance with ADLs?: Yes Does the patient have difficulty dressing or bathing?: No Independently performs ADLs?: Yes (appropriate for developmental age) Does the patient have difficulty walking or climbing stairs?: No Weakness of Legs: Right Weakness of Arms/Hands: Right  Permission Sought/Granted                  Emotional Assessment              Admission diagnosis:  Small bowel obstruction ([place])  [K56.609] Intestinal obstruction, unspecified cause, unspecified whether partial or complete ([place]) [K56.609] Patient Active Problem List   Diagnosis Date Noted   Intestinal obstruction ([place]) 02/01/2023   IDA (iron deficiency anemia) 01/09/2023   Menorrhagia 01/09/2023   Arthralgia of both hands 02/06/2022   History of small bowel obstruction 02/09/2021   Pannus, 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   Asthma 07/18/2017   Hidradenitis suppurativa 03/24/2017   Class 3 severe obesity with body mass index (BMI) of 50.0 to 59.9 in adult ([place]) 03/17/2016   Migraines 02/26/2016   OSA on CPAP 02/26/2016   Left ventricular hypertrophy 08/04/2014   Status post bariatric surgery 07/07/2014   PCP:  Delsa Grana, PA-C Pharmacy:   [place], [place] [address] AT [facility] [address] [place] [address] Phone: [phone] Fax: [phone]     Social Determinants of Health (SDOH) Social History: SDOH Screenings   Food Insecurity: No Food Insecurity (02/01/2023)  Housing: Low Risk  (02/01/2023)  Transportation Needs: No Transportation Needs (02/01/2023)  Utilities: Not At Risk (02/01/2023)  Alcohol Screen: Low Risk  (01/20/2023)  Depression (PHQ2-9): Low Risk  (01/20/2023)  Financial Resource Strain: Low Risk  (01/20/2023)  Physical Activity: Insufficiently Active (01/20/2023)  Social Connections: Socially Integrated (01/20/2023)  Stress: No Stress Concern Present (01/20/2023)  Tobacco Use: Low Risk  (01/31/2023)   SDOH Interventions:     Readmission Risk Interventions     No data to display

## 2023-02-03 NOTE — Plan of Care (Signed)
Discharge instructions reviewed, patient receiving venofer infusion in patient , cancer center made aware if cancellation. Patient will be discharged to home

## 2023-02-04 ENCOUNTER — Telehealth: Payer: Self-pay

## 2023-02-04 NOTE — Transitions of Care (Post Inpatient/ED Visit) (Signed)
   02/04/2023  Name: Tanya Reeves MRN: [id number] DOB: 09-Jan-1986  Today's TOC FU Call Status: Today's TOC FU Call Status:: Successful TOC FU Call Competed TOC FU Call Complete Date: 02/04/23  Transition Care Management Follow-up Telephone Call Date of Discharge: 02/03/23 Discharge Facility: [facility]) Type of Discharge: Inpatient Admission Primary Inpatient Discharge Diagnosis:: intestional obstruction How have you been since you were released from the hospital?: Better Any questions or concerns?: No  Items Reviewed: Did you receive and understand the discharge instructions provided?: Yes Medications obtained and verified?: Yes (Medications Reviewed) Any new allergies since your discharge?: No Dietary orders reviewed?: Yes Do you have support at home?: Yes People in Home: spouse, parent(s)  [place] and Equipment/Supplies: [place] Ordered?: NA Any new equipment or medical supplies ordered?: NA  Functional Questionnaire: Do you need assistance with bathing/showering or dressing?: No Do you need assistance with meal preparation?: No Do you need assistance with eating?: No Do you have difficulty maintaining continence: No Do you need assistance with getting out of bed/getting out of a chair/moving?: No Do you have difficulty managing or taking your medications?: No  Follow up appointments reviewed: PCP Follow-up appointment confirmed?: [facility] Follow-up appointment confirmed?: Yes Date of Specialist follow-up appointment?: 02/10/23 Follow-Up Specialty Provider:: Dr Dahlia Byes Do you need transportation to your follow-up appointment?: No Do you understand care options if your condition(s) worsen?: Yes-patient verbalized understanding    [place], [place] Nurse Health Advisor Direct Dial [phone]

## 2023-02-10 ENCOUNTER — Encounter: Payer: Self-pay | Admitting: Surgery

## 2023-02-10 ENCOUNTER — Ambulatory Visit (INDEPENDENT_AMBULATORY_CARE_PROVIDER_SITE_OTHER): Payer: 59 | Admitting: Surgery

## 2023-02-10 VITALS — BP 128/80 | HR 70 | Temp 98.7°F | Ht 63.0 in | Wt 310.4 lb

## 2023-02-10 DIAGNOSIS — E65 Localized adiposity: Secondary | ICD-10-CM | POA: Diagnosis not present

## 2023-02-10 DIAGNOSIS — K432 Incisional hernia without obstruction or gangrene: Secondary | ICD-10-CM

## 2023-02-10 DIAGNOSIS — K430 Incisional hernia with obstruction, without gangrene: Secondary | ICD-10-CM

## 2023-02-10 NOTE — Patient Instructions (Addendum)
A referral has been placed to Plastic surgery (Dr. Marla Roe). They will call you for an appointment.    If you have any concerns or questions, please feel free to call our office. See follow up appointment below.   Intestinal Pseudo-Obstruction  Intestinal pseudo-obstruction is a condition that causes symptoms of a blockage in the intestines without an actual blockage (obstruction). The intestinal tract is made up of tubes that digest food after it leaves the stomach. Most digestion takes place in the upper part of the intestines (small intestine). Undigested food leaves the small intestine and passes into the lower part (large intestine). There, water is absorbed and stools (feces) are formed. Intestinal pseudo-obstruction can take place anywhere along this tract. The condition can be a short-term problem (acute) or a long-term (chronic) disease. What are the causes? Causes of this condition are categorized as primary or secondary. Primary causes are abnormalities in the nerves and muscles that move food through the intestines. These abnormalities can be caused by gene differences (genetic mutations) that are passed from parent to child (inherited). Secondary causes are problems that result from other diseases or treatments that may affect the intestines. These include: Muscle and nervous system diseases. Infections. Cancer or cancer treatments. Medicines. Surgery. In some cases, the cause of this condition is not known. What are the signs or symptoms? Symptoms can vary from person to person. The symptoms depend on the cause of the condition and whether it is acute or chronic. Symptoms may include: Pain, swelling, or bloating in the abdomen. Nausea and vomiting. Difficulty having a bowel movement. Diarrhea. Poor nutrition and weight loss. How is this diagnosed? This condition may be diagnosed based on: Your symptoms and medical history. A physical exam. Tests to confirm the  diagnosis. These may include: Blood tests. Exams to look into the small intestine or the large intestine (endoscopy or colonoscopy). Taking an X-ray of the digestive tract after swallowing a substance that makes images clear (barium study). CT scan of the digestive tract. Placing a tube into the intestine to measure pressure (manometry). Removing a small tissue sample from the intestinal wall to look at under a microscope (biopsy). Intestinal pseudo-obstruction can be hard to diagnose because it can have many causes. Symptoms can also be similar to the symptoms of many other diseases. Most people need to have several exams. You may also need to see a health care provider who specializes in the digestive tract (gastroenterologist). How is this treated? Treatment depends on the causes of your condition. It may include: Treating an underlying disease. Changing medicines that may be causing the symptoms. Taking antibiotic medicines to treat an infection. Other treatments may include: Placing a tube (colonoscope) into the large intestine to remove gas. Placement of a small tube (nasogastric tube) through the nose, down the throat, and into the stomach. This may relieve pain, discomfort, and nausea by removing blocked air and fluids. Placing a tube (feeding tube) into the stomach or small intestine for liquid nutrition. Medicines for: Pain. Nausea. Diarrhea. Constipation. Stimulating the movement of the muscles of the intestines. Surgery to remove part of the intestine. This is rare. It may be needed if other treatments have not worked. Follow these instructions at home: Medicines Take over-the-counter and prescription medicines only as told by your health care provider. If you were prescribed an antibiotic medicine, take it as told by your health care provider. Do not stop taking the antibiotic even if you start to feel better. Eating and drinking Make  any changes to your diet or your eating  habits as recommended by your health care provider. This might include: Eating smaller meals more often. Try having 5 or 6 smaller meals each day instead of 3 large meals. Eating low fiber foods. Eating pureed foods or liquid food supplements. This may ease symptoms. Taking vitamin and mineral supplements to help prevent poor nutrition. Ask your health care provider to recommend a multivitamin. General instructions Keep all follow-up visits. This is important. Follow instructions from your health care provider about eating and drinking restrictions. Contact a health care provider if: Your symptoms do not go away or get worse. Get help right away if: You have severe pain in the abdomen. You cannot eat or drink without vomiting. Summary Intestinal pseudo-obstruction is a condition that causes symptoms of a blockage in your intestinal tract without an actual obstruction. Primary causes are abnormalities in the nerves and muscles that move food through the intestines. Secondary causes are problems that result from other diseases or treatments that affect the intestines. Symptoms depend on the cause of the condition and whether it is short-term or long-term. They may include pain in the abdomen, nausea and vomiting, constipation or diarrhea, poor nutrition, and weight loss. Treatment depends on the cause of your condition, but it may include changes to your diet or eating habits, medicines, or surgery. This information is not intended to replace advice given to you by your health care provider. Make sure you discuss any questions you have with your health care provider. Document Revised: 12/17/2020 Document Reviewed: 12/17/2020 Elsevier Patient Education  [place].

## 2023-02-11 ENCOUNTER — Ambulatory Visit (INDEPENDENT_AMBULATORY_CARE_PROVIDER_SITE_OTHER): Payer: 59 | Admitting: Plastic Surgery

## 2023-02-11 VITALS — BP 130/83 | HR 71 | Ht 63.0 in | Wt 312.8 lb

## 2023-02-11 DIAGNOSIS — M549 Dorsalgia, unspecified: Secondary | ICD-10-CM | POA: Insufficient documentation

## 2023-02-11 DIAGNOSIS — M546 Pain in thoracic spine: Secondary | ICD-10-CM

## 2023-02-11 DIAGNOSIS — L732 Hidradenitis suppurativa: Secondary | ICD-10-CM

## 2023-02-11 DIAGNOSIS — G8929 Other chronic pain: Secondary | ICD-10-CM

## 2023-02-11 DIAGNOSIS — E65 Localized adiposity: Secondary | ICD-10-CM | POA: Diagnosis not present

## 2023-02-11 DIAGNOSIS — M542 Cervicalgia: Secondary | ICD-10-CM | POA: Insufficient documentation

## 2023-02-11 DIAGNOSIS — Z9884 Bariatric surgery status: Secondary | ICD-10-CM

## 2023-02-11 NOTE — Progress Notes (Signed)
Outpatient Surgical Follow Up  02/11/2023  Tanya Reeves is an 37 y.o. female.   Chief Complaint  Patient presents with   Hospitalization Follow-up    Intestinal obstruction    HPI: Tanya Reeves is a 37 y.o. female e with complex close surgical history including single anastomosis duodenal switch 2020 Dr. Darnell Level..She   Has improved her weight but continues to be significantly overweight.  Current BMI is 56.  SHe presents with intermittent obstructions and this is the third episode associated with a recurrent ventral hernia. Last episode resolved medically   She did have a robotic ventral hernia repair with Dr. Hampton Abbot 2022. She did have a CT scan showing evidence of a recurrence and some dilation proximal to the ventral hernia defect.  There is no evidence of internal hernias. Labs notable for potassium of 3.4. Hemoglobin at baseline at 10.5. WBC normal.    Past Medical History:  Diagnosis Date   Asthma    Chronic hip pain    Chronic sinusitis 10/02/2016   Essential hypertension, benign 02/26/2016   Gastroesophageal reflux disease without esophagitis 08/26/2018   Hypertension    Incisional hernia with obstruction but no gangrene    Migraines    Morbid obesity ([place]) 03/17/2016   Pre-existing hypertension during pregnancy in third trimester 09/23/2014   Shortness of breath on exertion 08/26/2016   Sleep apnea     Past Surgical History:  Procedure Laterality Date   CESAREAN SECTION     2   CHOLECYSTECTOMY     COLON SURGERY     part of bowel removed due to c-section   INSERTION OF MESH  10/17/2021   Procedure: INSERTION OF MESH;  Surgeon: Olean Ree, MD;  Location: [facility] ORS;  Service: General;;   sips     STOMACH SURGERY     XI ROBOTIC ASSISTED VENTRAL HERNIA N/A 10/17/2021   Procedure: XI ROBOTIC ASSISTED VENTRAL HERNIA, incisional;  Surgeon: Olean Ree, MD;  Location: [facility] ORS;  Service: General;  Laterality: N/A;  Incisional hernia    Family History   Problem Relation Age of Onset   Diabetes Mother    Cancer Mother        femal organs   Hypoparathyroidism Mother    Asthma Mother    Diabetes Father    Juvenile idiopathic arthritis Daughter    Diabetes Maternal Grandmother    Diabetes Maternal Grandfather    Leukemia Paternal Grandmother    Aneurysm Paternal Grandfather     Social History:  reports that she has never smoked. She has never used smokeless tobacco. She reports that she does not currently use alcohol. She reports that she does not use drugs.  Allergies:  Allergies  Allergen Reactions   Cinnamon Swelling    Tongue swells up. With artificial (such as in gum/red hots)   Influenza Vaccines Hives    Hives two years in a row.  Hives two years in a row.     Medications reviewed.    ROS Full ROS performed and is otherwise negative other than what is stated in HPI   BP 128/80   Pulse 70   Temp 98.7 F (37.1 C) (Oral)   Ht 5\' 3"  (1.6 m)   Wt (!) 310 lb 6.4 oz (140.8 kg)   LMP 01/13/2023 (Exact Date) Comment: fairly regular cycles 26-28  SpO2 100%   BMI 54.98 kg/m   Physical Exam  Patient ID: Tanya Reeves, female   DOB: [birth year], 37 y.o.   MRN:  [id number]   HPI Tanya Reeves is a 37 y.o. female e with complex close surgical history including single anastomosis duodenal switch 2020 Dr. Darnell Level..She   Has improved her weight but continues to be significantly overweight.  Current BMI is 56.  SHe presents with intermittent obstructions and this is the third episode associated with a recurrent ventral hernia.   Presented with abdominal pain and nausea pain has been present for 5 days is colicky intermittent and moderate intensity.  Presented with some vomiting. This is the third episode.  She did have a robotic ventral hernia repair with Dr. Hampton Abbot 2022. She did have a CT scan showing evidence of a recurrence and some dilation proximal to the ventral hernia defect.  There is no evidence of internal  hernias. Labs notable for potassium of 3.4. Hemoglobin at baseline at 10.5. WBC normal.  HPI       Past Medical History:  Diagnosis Date   Asthma     Chronic hip pain     Chronic sinusitis 10/02/2016   Essential hypertension, benign 02/26/2016   Gastroesophageal reflux disease without esophagitis 08/26/2018   Hypertension     Incisional hernia with obstruction but no gangrene     Migraines     Morbid obesity ([place]) 03/17/2016   Pre-existing hypertension during pregnancy in third trimester 09/23/2014   Shortness of breath on exertion 08/26/2016   Sleep apnea             Past Surgical History:  Procedure Laterality Date   CESAREAN SECTION        2   CHOLECYSTECTOMY       COLON SURGERY        part of bowel removed due to c-section   INSERTION OF MESH   10/17/2021    Procedure: INSERTION OF MESH;  Surgeon: Olean Ree, MD;  Location: [facility] ORS;  Service: General;;   sips       STOMACH SURGERY       XI ROBOTIC ASSISTED VENTRAL HERNIA N/A 10/17/2021    Procedure: XI ROBOTIC ASSISTED VENTRAL HERNIA, incisional;  Surgeon: Olean Ree, MD;  Location: [facility] ORS;  Service: General;  Laterality: N/A;  Incisional hernia           Family History  Problem Relation Age of Onset   Diabetes Mother     Cancer Mother          femal organs   Hypoparathyroidism Mother     Asthma Mother     Diabetes Father     Juvenile idiopathic arthritis Daughter     Diabetes Maternal Grandmother     Diabetes Maternal Grandfather     Leukemia Paternal Grandmother     Aneurysm Paternal Grandfather        Social History Social History         Tobacco Use   Smoking status: Never   Smokeless tobacco: Never  Vaping Use   Vaping Use: Never used  Substance Use Topics   Alcohol use: Not Currently      Comment: Social   Drug use: No           Allergies  Allergen Reactions   Cinnamon Swelling      Tongue swells up. With artificial (such as in gum/red hots)   Influenza Vaccines Hives       Hives two years in a row.  Hives two years in a row.  Current Facility-Administered Medications  Medication Dose Route Frequency Provider Last Rate Last Admin   acetaminophen (TYLENOL) tablet 650 mg  650 mg Oral Q6H PRN Athena Masse, MD        Or   acetaminophen (TYLENOL) suppository 650 mg  650 mg Rectal Q6H PRN Athena Masse, MD       albuterol (PROVENTIL) (2.5 MG/3ML) 0.083% nebulizer solution 2.5 mg  2.5 mg Inhalation Q4H PRN Athena Masse, MD       cefTRIAXone (ROCEPHIN) 1 g in sodium chloride 0.9 % 100 mL IVPB  1 g Intravenous Q24H Athena Masse, MD   Stopped at 02/01/23 0756   enoxaparin (LOVENOX) injection 72.5 mg  0.5 mg/kg Subcutaneous Q24H Athena Masse, MD       lactated ringers infusion   Intravenous Continuous Athena Masse, MD 125 mL/hr at 02/01/23 0448 New Bag at 02/01/23 0448   morphine (PF) 2 MG/ML injection 2 mg  2 mg Intravenous Q2H PRN Athena Masse, MD       ondansetron [facility]) tablet 4 mg  4 mg Oral Q6H PRN Athena Masse, MD        Or   ondansetron [facility]) injection 4 mg  4 mg Intravenous Q6H PRN Athena Masse, MD            Review of Systems Full ROS  was asked and was negative except for the information on the HPI   Physical Exam Blood pressure 127/65, pulse 73, temperature 97.8 F (36.6 C), temperature source Oral, resp. rate 18, height 5\' 3"  (1.6 m), weight (!) 144.7 kg, last menstrual period 01/13/2023, SpO2 97 %. CONSTITUTIONAL: BMI 55. EYES: Pupils are equal, round, and r, Sclera are non-icteric. EARS, NOSE, MOUTH AND THROAT:The oral mucosa is pink and moist. Hearing is intact to voice. LYMPH NODES:  Lymph nodes in the neck are normal. RESPIRATORY:  Lungs are clear. There is normal respiratory effort, with equal breath sounds bilaterally, and without pathologic use of accessory muscles. CARDIOVASCULAR: Heart is regular without murmurs, gallops, or rubs. GI:  Massive pannus.   prior laparotomy scar.  There is a  reducible ventral hernia without peritonitis.  Abdomen is not tender. No masses.  There is no evidence of soft tissue infection   GU: Rectal deferred.   MUSCULOSKELETAL: Normal muscle strength and tone. No cyanosis or edema.   SKIN: Turgor is good and there are no pathologic skin lesions or ulcers. NEUROLOGIC: Motor and sensation is grossly normal. Cranial nerves are grossly intact. PSYCH:  Oriented to person, place and time. Affect is normal.      Assessment/Plan: 37 year old female with recurrent ventral hernia and massive pannus with a BMI of 55.  She did have evidence of bowel obstruction recently presumably from adhesions related to her hernia.  Very difficult situation.  I do think that she may benefit from losing some more weight and probably needs to be around 280 at least.  She does have a giant pannus.  In order to perform a redo ventral hernia repair she will need to have also a panniculectomy at the same time. Certainly this is a Psychologist, clinical.  We will have to consult with plastic surgery about the feasibility of a combined procedures. We will refer to plastics for evaluation, for now encourage her to continue diet and exercise as this will only improve her changes of having a hernia repair  Please note that I spent 40 minutes in this encounter including personally  reviewing imaging studies, coordinating her care, placing orders and performing appropriate documentation    Caroleen Hamman, MD [place] Surgeon

## 2023-02-11 NOTE — Progress Notes (Signed)
Patient ID: Tanya Reeves, female    DOB: 1986/01/22, 37 y.o.   MRN: [id number]   Chief Complaint  Patient presents with   Consult    Patient is a 37 year old female here for evaluation of her abdomen.  She saw Dr. Adora Fridge for several hernias.  She has had 8 hernias repaired in the past.  She also had a bypass surgery of her duodenum in 2020.  She also had 2 C-sections and a cholecystectomy.  Her current hemoglobin A1c is 6.1. She complains of back pain and rashes under her skin.  She also has a mons that hangs quite far down.  Her weight started off in the 400 range and she has lost 174 pounds since her bypass.  She is currently at 312 pounds and is 5 feet 3 inches tall.  She has been to [facility] and denied surgery based on her BMI according to the patient as well as [facility].  She has not been to [facility] or [facility] that she can recall.  It is difficult to palpate the hernias because of her size.  Her pannus hangs down to her knees.     Review of Systems  Constitutional:  Positive for activity change. Negative for appetite change.  Eyes: Negative.   Respiratory: Negative.  Negative for chest tightness and shortness of breath.   Cardiovascular: Negative.   Gastrointestinal:  Positive for abdominal pain.  Endocrine: Negative.   Genitourinary: Negative.   Musculoskeletal:  Positive for back pain and neck pain.  Skin:  Positive for rash.    Past Medical History:  Diagnosis Date   Asthma    Chronic hip pain    Chronic sinusitis 10/02/2016   Essential hypertension, benign 02/26/2016   Gastroesophageal reflux disease without esophagitis 08/26/2018   Hypertension    Incisional hernia with obstruction but no gangrene    Migraines    Morbid obesity ([place]) 03/17/2016   Pre-existing hypertension during pregnancy in third trimester 09/23/2014   Shortness of breath on exertion 08/26/2016   Sleep apnea     Past Surgical History:  Procedure Laterality Date   CESAREAN SECTION      2   CHOLECYSTECTOMY     COLON SURGERY     part of bowel removed due to c-section   INSERTION OF MESH  10/17/2021   Procedure: INSERTION OF MESH;  Surgeon: Olean Ree, MD;  Location: [facility] ORS;  Service: General;;   sips     STOMACH SURGERY     XI ROBOTIC ASSISTED VENTRAL HERNIA N/A 10/17/2021   Procedure: XI ROBOTIC ASSISTED VENTRAL HERNIA, incisional;  Surgeon: Olean Ree, MD;  Location: [facility] ORS;  Service: General;  Laterality: N/A;  Incisional hernia      Current Outpatient Medications:    eletriptan (RELPAX) 20 MG tablet, TAKE 1 TABLET BY MOUTH AS NEEDED FOR MIGRAINE OR HEADACHE. MAY REPEAT IN 2 HOURS IF HEADACHE PERSISTS OR RECURS, Disp: 6 tablet, Rfl: 5   ferrous sulfate 325 (65 FE) MG tablet, Take by mouth., Disp: , Rfl:    Multiple Vitamins-Minerals (BARIATRIC MULTIVITAMINS/IRON) CAPS, Take 1 capsule by mouth daily., Disp: , Rfl:    ondansetron (ZOFRAN ODT) 4 MG disintegrating tablet, Take 1 tablet (4 mg total) by mouth every 8 (eight) hours as needed for nausea or vomiting., Disp: 30 tablet, Rfl: 0   Objective:   Vitals:   02/11/23 0815  BP: 130/83  Pulse: 71  SpO2: 100%    Physical Exam Vitals and  nursing note reviewed.  Constitutional:      Appearance: Normal appearance.  HENT:     Head: Normocephalic and atraumatic.  Cardiovascular:     Rate and Rhythm: Normal rate.     Pulses: Normal pulses.  Pulmonary:     Effort: Pulmonary effort is normal.  Abdominal:     General: There is no distension.     Palpations: Abdomen is soft.     Tenderness: There is no abdominal tenderness.     Hernia: A hernia is present.  Musculoskeletal:        General: No swelling or deformity.  Skin:    General: Skin is warm.     Capillary Refill: Capillary refill takes less than 2 seconds.     Coloration: Skin is not jaundiced.     Findings: Rash present. No bruising or lesion.  Neurological:     Mental Status: She is alert and oriented to person, place, and time.   Psychiatric:        Mood and Affect: Mood normal.        Behavior: Behavior normal.        Thought Content: Thought content normal.        Judgment: Judgment normal.     Assessment & Plan:  Pannus, abdominal  Status post bariatric surgery  Hidradenitis suppurativa  Chronic bilateral thoracic back pain  Neck pain  I had a long discussion about the surgery.  I think that it could be done at the same time however if mesh is going to be exposed then probably needs to be a separate procedure for the panniculectomy.  Her chance of opening up is very likely to happen.  I think that she is going to be safest in a university setting and would encourage her to wait until her BMI is lower before she has the panniculectomy because it will be difficult to close her because of the thickness of her abdominal wall.  I have reached out to Dr. Dahlia Byes to give my review.   Pictures were obtained of the patient and placed in the chart with the patient's or guardian's permission.  [place], DO

## 2023-03-10 ENCOUNTER — Telehealth: Payer: Self-pay | Admitting: *Deleted

## 2023-03-10 NOTE — Telephone Encounter (Signed)
Patient called and stated that she saw Dr.Dillingham on 02/11/23 and per Dr Ulice Bold she told the patient that she will need a bigger surgical team for her combined case with you and Dr Ulice Bold. So the patient wants to know what they next steps are.

## 2023-03-11 ENCOUNTER — Telehealth: Payer: Self-pay | Admitting: *Deleted

## 2023-03-11 NOTE — Telephone Encounter (Signed)
Referral has been faxed to [facility]. They will contact the patient about scheduling.

## 2023-03-11 NOTE — Telephone Encounter (Signed)
Patient states that she would like to try [facility] to see if they can handel this surgery for her.

## 2023-03-11 NOTE — Telephone Encounter (Signed)
[facility] is out until September, she kept her appointment with [facility] but she wants [place] to send a referral to  [facility] to see if they could get her in sooner. Please call and advise

## 2023-03-17 ENCOUNTER — Encounter: Payer: Self-pay | Admitting: Oncology

## 2023-03-24 ENCOUNTER — Ambulatory Visit: Payer: 59 | Admitting: Surgery

## 2023-04-04 ENCOUNTER — Inpatient Hospital Stay: Payer: 59 | Attending: Oncology

## 2023-04-04 DIAGNOSIS — D508 Other iron deficiency anemias: Secondary | ICD-10-CM | POA: Diagnosis present

## 2023-04-04 DIAGNOSIS — Z79899 Other long term (current) drug therapy: Secondary | ICD-10-CM | POA: Insufficient documentation

## 2023-04-04 DIAGNOSIS — N920 Excessive and frequent menstruation with regular cycle: Secondary | ICD-10-CM | POA: Diagnosis not present

## 2023-04-04 DIAGNOSIS — J45909 Unspecified asthma, uncomplicated: Secondary | ICD-10-CM | POA: Insufficient documentation

## 2023-04-04 DIAGNOSIS — Z9884 Bariatric surgery status: Secondary | ICD-10-CM | POA: Diagnosis not present

## 2023-04-04 DIAGNOSIS — R29818 Other symptoms and signs involving the nervous system: Secondary | ICD-10-CM | POA: Insufficient documentation

## 2023-04-04 DIAGNOSIS — Z832 Family history of diseases of the blood and blood-forming organs and certain disorders involving the immune mechanism: Secondary | ICD-10-CM | POA: Insufficient documentation

## 2023-04-04 DIAGNOSIS — Z8349 Family history of other endocrine, nutritional and metabolic diseases: Secondary | ICD-10-CM | POA: Insufficient documentation

## 2023-04-04 DIAGNOSIS — Z9049 Acquired absence of other specified parts of digestive tract: Secondary | ICD-10-CM | POA: Insufficient documentation

## 2023-04-04 DIAGNOSIS — Z833 Family history of diabetes mellitus: Secondary | ICD-10-CM | POA: Insufficient documentation

## 2023-04-04 DIAGNOSIS — Z8049 Family history of malignant neoplasm of other genital organs: Secondary | ICD-10-CM | POA: Insufficient documentation

## 2023-04-04 DIAGNOSIS — D509 Iron deficiency anemia, unspecified: Secondary | ICD-10-CM | POA: Diagnosis not present

## 2023-04-04 DIAGNOSIS — R5383 Other fatigue: Secondary | ICD-10-CM | POA: Diagnosis not present

## 2023-04-04 DIAGNOSIS — Z887 Allergy status to serum and vaccine status: Secondary | ICD-10-CM | POA: Diagnosis not present

## 2023-04-04 DIAGNOSIS — Z8249 Family history of ischemic heart disease and other diseases of the circulatory system: Secondary | ICD-10-CM | POA: Insufficient documentation

## 2023-04-04 DIAGNOSIS — G4730 Sleep apnea, unspecified: Secondary | ICD-10-CM | POA: Diagnosis not present

## 2023-04-04 LAB — CBC WITH DIFFERENTIAL (CANCER CENTER ONLY)
Abs Immature Granulocytes: 0.04 10*3/uL (ref 0.00–0.07)
Basophils Absolute: 0 10*3/uL (ref 0.0–0.1)
Basophils Relative: 1 %
Eosinophils Absolute: 0.2 10*3/uL (ref 0.0–0.5)
Eosinophils Relative: 3 %
HCT: 39.2 % (ref 36.0–46.0)
Hemoglobin: 12.3 g/dL (ref 12.0–15.0)
Immature Granulocytes: 1 %
Lymphocytes Relative: 36 %
Lymphs Abs: 2.2 10*3/uL (ref 0.7–4.0)
MCH: 25.8 pg — ABNORMAL LOW (ref 26.0–34.0)
MCHC: 31.4 g/dL (ref 30.0–36.0)
MCV: 82.2 fL (ref 80.0–100.0)
Monocytes Absolute: 0.4 10*3/uL (ref 0.1–1.0)
Monocytes Relative: 7 %
Neutro Abs: 3.2 10*3/uL (ref 1.7–7.7)
Neutrophils Relative %: 52 %
Platelet Count: 245 10*3/uL (ref 150–400)
RBC: 4.77 MIL/uL (ref 3.87–5.11)
RDW: 16.7 % — ABNORMAL HIGH (ref 11.5–15.5)
WBC Count: 6.1 10*3/uL (ref 4.0–10.5)
nRBC: 0 % (ref 0.0–0.2)

## 2023-04-04 LAB — IRON AND TIBC
Iron: 57 ug/dL (ref 28–170)
Saturation Ratios: 13 % (ref 10.4–31.8)
TIBC: 440 ug/dL (ref 250–450)
UIBC: 383 ug/dL

## 2023-04-04 LAB — RETIC PANEL
Immature Retic Fract: 12.8 % (ref 2.3–15.9)
RBC.: 4.64 MIL/uL (ref 3.87–5.11)
Retic Count, Absolute: 60.8 10*3/uL (ref 19.0–186.0)
Retic Ct Pct: 1.3 % (ref 0.4–3.1)
Reticulocyte Hemoglobin: 29.3 pg (ref >27.9)

## 2023-04-04 LAB — FERRITIN: Ferritin: 18 ng/mL (ref 11–307)

## 2023-04-09 ENCOUNTER — Encounter: Payer: Self-pay | Admitting: Oncology

## 2023-04-09 ENCOUNTER — Inpatient Hospital Stay: Payer: 59

## 2023-04-09 ENCOUNTER — Inpatient Hospital Stay (HOSPITAL_BASED_OUTPATIENT_CLINIC_OR_DEPARTMENT_OTHER): Payer: 59 | Admitting: Oncology

## 2023-04-09 VITALS — BP 136/80 | HR 87 | Temp 98.4°F | Resp 18 | Wt 316.2 lb

## 2023-04-09 VITALS — BP 126/80 | HR 74 | Temp 98.2°F | Resp 18

## 2023-04-09 DIAGNOSIS — N920 Excessive and frequent menstruation with regular cycle: Secondary | ICD-10-CM

## 2023-04-09 DIAGNOSIS — D508 Other iron deficiency anemias: Secondary | ICD-10-CM

## 2023-04-09 DIAGNOSIS — D509 Iron deficiency anemia, unspecified: Secondary | ICD-10-CM | POA: Diagnosis not present

## 2023-04-09 MED ORDER — SODIUM CHLORIDE 0.9 % IV SOLN
Freq: Once | INTRAVENOUS | Status: AC
  Filled 2023-04-09: qty 250

## 2023-04-09 MED ORDER — SODIUM CHLORIDE 0.9 % IV SOLN
200.0000 mg | Freq: Once | INTRAVENOUS | Status: AC
  Administered 2023-04-09: 200 mg via INTRAVENOUS
  Filled 2023-04-09: qty 200

## 2023-04-09 NOTE — Assessment & Plan Note (Signed)
Cervical oncology workup.  Hysterectomy was recommended however cannot be done until pannus resection first.

## 2023-04-09 NOTE — Progress Notes (Signed)
Hematology/Oncology Consult note [phone] [phone]      Patient Care Team: Danelle Berry, PA-C as PCP - General (Family Medicine) Rickard Patience, MD as Consulting Physician (Oncology)   REFERRING PROVIDER: Danelle Berry, PA-C  CHIEF COMPLAINTS/REASON FOR VISIT:  Anemia  ASSESSMENT & PLAN:  IDA (iron deficiency anemia) Due to bariatric surgery and possible chronic blood loss from menorrhagia.  Labs are reviewed and discussed with patient.  Lab Results  Component Value Date   HGB 12.3 04/04/2023   TIBC 440 04/04/2023   IRONPCTSAT 13 04/04/2023   FERRITIN 18 04/04/2023  Both hemoglobin and iron parameters have improved. Given that her TIBC is still at high normal end, iron saturation at the low normal end, heavy menstrual bleeding and gastric bypass Recommend additional weekly Venofer x 2 to further improve her iron stores.  Menorrhagia Cervical oncology workup.  Hysterectomy was recommended however cannot be done until pannus resection first.   Orders Placed This Encounter  Procedures   CBC with Differential (Cancer Center Only)    Standing Status:   Future    Standing Expiration Date:   04/08/2024   Iron and TIBC    Standing Status:   Future    Standing Expiration Date:   04/08/2024   Ferritin    Standing Status:   Future    Standing Expiration Date:   04/08/2024   Follow up in 4 months.  All questions were answered. The patient knows to call the clinic with any problems, questions or concerns.  Rickard Patience, MD, PhD [facility] Hematology Oncology 04/09/2023     HISTORY OF PRESENTING ILLNESS:  Tanya Reeves is a  37 y.o.  female with PMH listed below who was referred to me for anemia Reviewed patient's recent labs that was done.   abnormal CBC on 2/13, Hb 10.8, mcv 75, elevated B12, normal folate, iron saturation 8, ferriitn 4, TIBC 467 Patient has bariatric surgery in 2022.  + fatigue, shortness of breath on  exertion She denies recent  chest pain on exertion,  pre-syncopal episodes, or palpitations She had not noticed any recent bleeding such as epistaxis, hematuria or hematochezia.  + Heavy menstrual period She denies over the counter NSAID ingestion.  She takes bariatric multivitamin and oral iron supplementation.    INTERVAL HISTORY Tanya Reeves is a 37 y.o. female who has above history reviewed by me today presents for follow up visit for iron deficiency anemia.  She tolerated IV Venofer treatments.  She has felt improvement in her fatigue level and neurologic symptoms.    MEDICAL HISTORY:  Past Medical History:  Diagnosis Date   Asthma    Chronic hip pain    Chronic sinusitis 10/02/2016   Essential hypertension, benign 02/26/2016   Gastroesophageal reflux disease without esophagitis 08/26/2018   Hypertension    Incisional hernia with obstruction but no gangrene    Migraines    Morbid obesity ([facility]) 03/17/2016   Pre-existing hypertension during pregnancy in third trimester 09/23/2014   Shortness of breath on exertion 08/26/2016   Sleep apnea     SURGICAL HISTORY: Past Surgical History:  Procedure Laterality Date   CESAREAN SECTION     2   CHOLECYSTECTOMY     COLON SURGERY     part of bowel removed due to c-section   INSERTION OF MESH  10/17/2021   Procedure: INSERTION OF MESH;  Surgeon: Henrene Dodge, MD;  Location: [facility] ORS;  Service: General;;   sips     STOMACH  SURGERY     XI ROBOTIC ASSISTED VENTRAL HERNIA N/A 10/17/2021   Procedure: XI ROBOTIC ASSISTED VENTRAL HERNIA, incisional;  Surgeon: Henrene Dodge, MD;  Location: [facility] ORS;  Service: General;  Laterality: N/A;  Incisional hernia    SOCIAL HISTORY: Social History   Socioeconomic History   Marital status: Married    Spouse name: Timothy    Number of children: 2   Years of education: Not on file   Highest education level: Associate degree: academic program  Occupational History   Not on file  Tobacco Use   Smoking status:  Never   Smokeless tobacco: Never  Vaping Use   Vaping Use: Never used  Substance and Sexual Activity   Alcohol use: Not Currently    Comment: Social   Drug use: No   Sexual activity: Yes    Partners: Male    Birth control/protection: Surgical    Comment: Tubal Ligation   Other Topics Concern   Not on file  Social History Narrative   Not on file   Social Determinants of Health   Financial Resource Strain: Low Risk  (01/20/2023)   Overall Financial Resource Strain (CARDIA)    Difficulty of Paying Living Expenses: Not hard at all  Food Insecurity: No Food Insecurity (02/01/2023)   Hunger Vital Sign    Worried About Running Out of Food in the Last Year: Never true    Ran Out of Food in the Last Year: Never true  Transportation Needs: No Transportation Needs (02/01/2023)   PRAPARE - Administrator, Civil Service (Medical): No    Lack of Transportation (Non-Medical): No  Physical Activity: Insufficiently Active (01/20/2023)   Exercise Vital Sign    Days of Exercise per Week: 1 day    Minutes of Exercise per Session: 30 min  Stress: No Stress Concern Present (01/20/2023)   [employer] of Occupational Health - Occupational Stress Questionnaire    Feeling of Stress : Not at all  Social Connections: Socially Integrated (01/20/2023)   Social Connection and Isolation Panel [NHANES]    Frequency of Communication with Friends and Family: More than three times a week    Frequency of Social Gatherings with Friends and Family: More than three times a week    Attends Religious Services: 1 to 4 times per year    Active Member of [employer] or Organizations: Yes    Attends Banker Meetings: 1 to 4 times per year    Marital Status: Married  Catering manager Violence: Not At Risk (02/01/2023)   Humiliation, Afraid, Rape, and Kick questionnaire    Fear of Current or Ex-Partner: No    Emotionally Abused: No    Physically Abused: No    Sexually Abused: No    FAMILY  HISTORY: Family History  Problem Relation Age of Onset   Diabetes Mother    Cancer Mother        femal organs   Hypoparathyroidism Mother    Asthma Mother    Diabetes Father    Juvenile idiopathic arthritis Daughter    Diabetes Maternal Grandmother    Diabetes Maternal Grandfather    Leukemia Paternal Grandmother    Aneurysm Paternal Grandfather     ALLERGIES:  is allergic to cinnamon and influenza vaccines.  MEDICATIONS:  Current Outpatient Medications  Medication Sig Dispense Refill   eletriptan (RELPAX) 20 MG tablet TAKE 1 TABLET BY MOUTH AS NEEDED FOR MIGRAINE OR HEADACHE. MAY REPEAT IN 2 HOURS IF HEADACHE PERSISTS OR RECURS  6 tablet 5   ferrous sulfate 325 (65 FE) MG tablet Take by mouth.     Multiple Vitamins-Minerals (BARIATRIC MULTIVITAMINS/IRON) CAPS Take 1 capsule by mouth daily.     ondansetron (ZOFRAN ODT) 4 MG disintegrating tablet Take 1 tablet (4 mg total) by mouth every 8 (eight) hours as needed for nausea or vomiting. 30 tablet 0   No current facility-administered medications for this visit.    Review of Systems  Constitutional:  Positive for fatigue. Negative for appetite change, chills and fever.  HENT:   Negative for hearing loss and voice change.   Eyes:  Negative for eye problems.  Respiratory:  Negative for chest tightness and cough.   Cardiovascular:  Negative for chest pain.  Gastrointestinal:  Negative for abdominal distention, abdominal pain and blood in stool.  Endocrine: Negative for hot flashes.  Genitourinary:  Positive for menstrual problem. Negative for difficulty urinating and frequency.   Musculoskeletal:  Negative for arthralgias.  Skin:  Negative for itching and rash.  Neurological:  Negative for extremity weakness.  Hematological:  Negative for adenopathy.  Psychiatric/Behavioral:  Negative for confusion.     PHYSICAL EXAMINATION: ECOG PERFORMANCE STATUS: 1 - Symptomatic but completely ambulatory Vitals:   04/09/23 1309  BP:  136/80  Pulse: 87  Resp: 18  Temp: 98.4 F (36.9 C)   Filed Weights   04/09/23 1309  Weight: (!) 316 lb 3.2 oz (143.4 kg)    Physical Exam Constitutional:      General: She is not in acute distress. HENT:     Head: Normocephalic and atraumatic.  Eyes:     General: No scleral icterus. Cardiovascular:     Rate and Rhythm: Normal rate and regular rhythm.     Heart sounds: Normal heart sounds.  Pulmonary:     Effort: Pulmonary effort is normal. No respiratory distress.  Abdominal:     General: There is no distension.  Musculoskeletal:        General: No deformity. Normal range of motion.     Cervical back: Normal range of motion and neck supple.  Skin:    General: Skin is warm and dry.     Findings: No rash.  Neurological:     Mental Status: She is alert and oriented to person, place, and time. Mental status is at baseline.     Cranial Nerves: No cranial nerve deficit.  Psychiatric:        Mood and Affect: Mood normal.      LABORATORY DATA:  I have reviewed the data as listed    Latest Ref Rng & Units 04/04/2023    8:16 AM 02/02/2023    6:01 AM 02/01/2023    5:00 AM  CBC  WBC 4.0 - 10.5 K/uL 6.1  5.9  7.4   Hemoglobin 12.0 - 15.0 g/dL 62.1  9.0  9.7   Hematocrit 36.0 - 46.0 % 39.2  28.6  31.3   Platelets 150 - 400 K/uL 245  220  236       Latest Ref Rng & Units 02/02/2023    6:01 AM 02/01/2023    5:00 AM 01/31/2023   11:05 PM  CMP  Glucose 70 - 99 mg/dL 308   657   BUN 6 - 20 mg/dL <5   13   Creatinine 8.46 - 1.00 mg/dL 9.62  9.52  8.41   Sodium 135 - 145 mmol/L 136   139   Potassium 3.5 - 5.1 mmol/L 3.2   3.4  Chloride 98 - 111 mmol/L 107   106   CO2 22 - 32 mmol/L 24   24   Calcium 8.9 - 10.3 mg/dL 8.2   8.9   Total Protein 6.5 - 8.1 g/dL   7.3   Total Bilirubin 0.3 - 1.2 mg/dL   0.5   Alkaline Phos 38 - 126 U/L   54   AST 15 - 41 U/L   16   ALT 0 - 44 U/L   21       Component Value Date/Time   IRON 57 04/04/2023 0816   TIBC 440 04/04/2023 0816    FERRITIN 18 04/04/2023 0816   IRONPCTSAT 13 04/04/2023 0816   IRONPCTSAT 8 (L) 12/31/2022 0911     RADIOGRAPHIC STUDIES: I have personally reviewed the radiological images as listed and agreed with the findings in the report. No results found.

## 2023-04-09 NOTE — Assessment & Plan Note (Addendum)
Due to bariatric surgery and possible chronic blood loss from menorrhagia.  Labs are reviewed and discussed with patient.  Lab Results  Component Value Date   HGB 12.3 04/04/2023   TIBC 440 04/04/2023   IRONPCTSAT 13 04/04/2023   FERRITIN 18 04/04/2023  Both hemoglobin and iron parameters have improved. Given that her TIBC is still at high normal end, iron saturation at the low normal end, heavy menstrual bleeding and gastric bypass Recommend additional weekly Venofer x 2 to further improve her iron stores.

## 2023-04-17 ENCOUNTER — Inpatient Hospital Stay: Payer: 59

## 2023-04-17 VITALS — BP 136/67 | HR 77 | Temp 98.0°F | Resp 18

## 2023-04-17 DIAGNOSIS — D508 Other iron deficiency anemias: Secondary | ICD-10-CM

## 2023-04-17 DIAGNOSIS — D509 Iron deficiency anemia, unspecified: Secondary | ICD-10-CM | POA: Diagnosis not present

## 2023-04-17 MED ORDER — SODIUM CHLORIDE 0.9 % IV SOLN
Freq: Once | INTRAVENOUS | Status: AC
  Filled 2023-04-17: qty 250

## 2023-04-17 MED ORDER — SODIUM CHLORIDE 0.9 % IV SOLN
200.0000 mg | Freq: Once | INTRAVENOUS | Status: AC
  Administered 2023-04-17: 200 mg via INTRAVENOUS
  Filled 2023-04-17: qty 200

## 2023-04-17 NOTE — Progress Notes (Signed)
Patient declined to wait the 30 minutes for post iron infusion observation today. Tolerated infusion well. VSS. 

## 2023-06-02 ENCOUNTER — Encounter: Payer: Self-pay | Admitting: Family Medicine

## 2023-06-03 ENCOUNTER — Encounter: Payer: Self-pay | Admitting: Oncology

## 2023-06-04 ENCOUNTER — Encounter: Payer: Self-pay | Admitting: Family Medicine

## 2023-06-04 ENCOUNTER — Ambulatory Visit (INDEPENDENT_AMBULATORY_CARE_PROVIDER_SITE_OTHER): Payer: 59 | Admitting: Family Medicine

## 2023-06-04 VITALS — BP 132/84 | HR 79 | Temp 97.7°F | Resp 16 | Ht 63.0 in | Wt 315.9 lb

## 2023-06-04 DIAGNOSIS — M25542 Pain in joints of left hand: Secondary | ICD-10-CM

## 2023-06-04 DIAGNOSIS — G43809 Other migraine, not intractable, without status migrainosus: Secondary | ICD-10-CM

## 2023-06-04 DIAGNOSIS — M25541 Pain in joints of right hand: Secondary | ICD-10-CM

## 2023-06-04 DIAGNOSIS — D508 Other iron deficiency anemias: Secondary | ICD-10-CM

## 2023-06-04 DIAGNOSIS — L732 Hidradenitis suppurativa: Secondary | ICD-10-CM

## 2023-06-04 DIAGNOSIS — Z0289 Encounter for other administrative examinations: Secondary | ICD-10-CM

## 2023-06-04 DIAGNOSIS — K56600 Partial intestinal obstruction, unspecified as to cause: Secondary | ICD-10-CM

## 2023-06-04 DIAGNOSIS — Z6841 Body Mass Index (BMI) 40.0 and over, adult: Secondary | ICD-10-CM

## 2023-06-04 DIAGNOSIS — G4733 Obstructive sleep apnea (adult) (pediatric): Secondary | ICD-10-CM

## 2023-06-04 DIAGNOSIS — J4520 Mild intermittent asthma, uncomplicated: Secondary | ICD-10-CM

## 2023-06-04 DIAGNOSIS — E782 Mixed hyperlipidemia: Secondary | ICD-10-CM

## 2023-06-04 NOTE — Progress Notes (Signed)
Patient ID: Tanya Reeves, female    DOB: 1986-08-24, 37 y.o.   MRN: [id number]  PCP: Danelle Berry, PA-C  Chief Complaint  Patient presents with   Form Completion    Subjective:   Tanya Reeves is a 37 y.o. female, presents to clinic with CC of the following:  HPI  Here for completion of forms [place] division of social services, med eval for foster parenting She has some health changes with recurrent hernias and bowel obstructions, pending surgical consult at [facility] Recent hospital admission x 2 with Dr. Everlene Farrier - march 2024 Discharge Diagnoses:  Principal Problem:   Intestinal obstruction (HCC) Active Problems:   History of small bowel obstruction   Migraines   OSA on CPAP   Class 3 severe obesity with body mass index (BMI) of 50.0 to 59.9 in adult [facility])   Hidradenitis suppurativa   Asthma   Status post biliopancreatic diversion with duodenal switch   IDA (iron deficiency anemia)   Other medical conditions have been stable - labs done in March, no med changes  Patient Active Problem List   Diagnosis Date Noted   Back pain 02/11/2023   Neck pain 02/11/2023   Intestinal obstruction (HCC) 02/01/2023   IDA (iron deficiency anemia) 01/09/2023   Menorrhagia 01/09/2023   Arthralgia of both hands 02/06/2022   History of small bowel obstruction 02/09/2021   Pannus, 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   Asthma 07/18/2017   Hidradenitis suppurativa 03/24/2017   Class 3 severe obesity with body mass index (BMI) of 50.0 to 59.9 in adult (HCC) 03/17/2016   Migraines 02/26/2016   OSA on CPAP 02/26/2016   Left ventricular hypertrophy 08/04/2014   Status post bariatric surgery 07/07/2014      Current Outpatient Medications:    eletriptan (RELPAX) 20 MG tablet, TAKE 1 TABLET BY MOUTH AS NEEDED FOR MIGRAINE OR HEADACHE. MAY REPEAT IN 2 HOURS IF HEADACHE PERSISTS OR RECURS, Disp: 6 tablet, Rfl: 5   ferrous sulfate 325  (65 FE) MG tablet, Take by mouth., Disp: , Rfl:    Multiple Vitamins-Minerals (BARIATRIC MULTIVITAMINS/IRON) CAPS, Take 1 capsule by mouth daily., Disp: , Rfl:    ondansetron (ZOFRAN ODT) 4 MG disintegrating tablet, Take 1 tablet (4 mg total) by mouth every 8 (eight) hours as needed for nausea or vomiting., Disp: 30 tablet, Rfl: 0   Allergies  Allergen Reactions   Cinnamon Swelling    Tongue swells up. With artificial (such as in gum/red hots)   Influenza Vaccines Hives    Hives two years in a row.  Hives two years in a row.      Social History   Tobacco Use   Smoking status: Never   Smokeless tobacco: Never  Vaping Use   Vaping status: Never Used  Substance Use Topics   Alcohol use: Not Currently    Comment: Social   Drug use: No      Chart Review Today: I personally reviewed active problem list, medication list, allergies, family history, social history, health maintenance, notes from last encounter, lab results, imaging with the patient/caregiver today.   Review of Systems  Constitutional: Negative.   HENT: Negative.    Eyes: Negative.   Respiratory: Negative.    Cardiovascular: Negative.   Gastrointestinal: Negative.   Endocrine: Negative.   Genitourinary: Negative.   Musculoskeletal: Negative.   Skin: Negative.   Allergic/Immunologic: Negative.   Neurological: Negative.   Hematological: Negative.   Psychiatric/Behavioral:  Negative.    All other systems reviewed and are negative.      Objective:   Vitals:   06/04/23 1111  BP: 132/84  Pulse: 79  Resp: 16  Temp: 97.7 F (36.5 C)  TempSrc: Oral  SpO2: 98%  Weight: (!) 315 lb 14.4 oz (143.3 kg)  Height: 5\' 3"  (1.6 m)    Body mass index is 55.96 kg/m.  Physical Exam Vitals and nursing note reviewed.  Constitutional:      General: She is not in acute distress.    Appearance: Normal appearance. She is well-developed. She is not ill-appearing, toxic-appearing or diaphoretic.  HENT:     Head:  Normocephalic and atraumatic.     Nose: Nose normal.  Eyes:     General:        Right eye: No discharge.        Left eye: No discharge.     Conjunctiva/sclera: Conjunctivae normal.  Neck:     Trachea: No tracheal deviation.  Cardiovascular:     Rate and Rhythm: Normal rate.  Pulmonary:     Effort: Pulmonary effort is normal. No respiratory distress.     Breath sounds: No stridor.  Skin:    General: Skin is warm and dry.     Findings: No rash.  Neurological:     Mental Status: She is alert.     Motor: No abnormal muscle tone.     Coordination: Coordination normal.  Psychiatric:        Mood and Affect: Mood normal.        Behavior: Behavior normal. Behavior is cooperative.      Results for orders placed or performed in visit on 04/04/23  Retic Panel  Result Value Ref Range   Retic Ct Pct 1.3 0.4 - 3.1 %   RBC. 4.64 3.87 - 5.11 MIL/uL   Retic Count, Absolute 60.8 19.0 - 186.0 K/uL   Immature Retic Fract 12.8 2.3 - 15.9 %   Reticulocyte Hemoglobin 29.3 >27.9 pg  Ferritin  Result Value Ref Range   Ferritin 18 11 - 307 ng/mL  Iron and TIBC  Result Value Ref Range   Iron 57 28 - 170 ug/dL   TIBC 147 829 - 562 ug/dL   Saturation Ratios 13 10.4 - 31.8 %   UIBC 383 ug/dL  CBC with Differential (Cancer Center Only)  Result Value Ref Range   WBC Count 6.1 4.0 - 10.5 K/uL   RBC 4.77 3.87 - 5.11 MIL/uL   Hemoglobin 12.3 12.0 - 15.0 g/dL   HCT 13.0 86.5 - 78.4 %   MCV 82.2 80.0 - 100.0 fL   MCH 25.8 (L) 26.0 - 34.0 pg   MCHC 31.4 30.0 - 36.0 g/dL   RDW 69.6 (H) 29.5 - 28.4 %   Platelet Count 245 150 - 400 K/uL   nRBC 0.0 0.0 - 0.2 %   Neutrophils Relative % 52 %   Neutro Abs 3.2 1.7 - 7.7 K/uL   Lymphocytes Relative 36 %   Lymphs Abs 2.2 0.7 - 4.0 K/uL   Monocytes Relative 7 %   Monocytes Absolute 0.4 0.1 - 1.0 K/uL   Eosinophils Relative 3 %   Eosinophils Absolute 0.2 0.0 - 0.5 K/uL   Basophils Relative 1 %   Basophils Absolute 0.0 0.0 - 0.1 K/uL   Immature  Granulocytes 1 %   Abs Immature Granulocytes 0.04 0.00 - 0.07 K/uL       Assessment & Plan:    1.  Encounter for completion of form with patient Forms completed- see scanned in documents Currently some physical limitations, with hernia she is not lifting anything heavier than a gallon of milk, otherwise everything updated and stable and well controlled, no mental health concerns She is pending [facility] consult with specialists for hernia repair, plastics/skin removal surgery   2. Arthralgia of both hands Sx flare up and down currently not on meds and she is following with neuro/rheumatology  3. Class 3 severe obesity with body mass index (BMI) of 50.0 to 59.9 in adult, unspecified obesity type, unspecified whether serious comorbidity present (HCC) Weight BMI stable she has worked with general surgery, RD and is on healthy diet, large amount of her weight is hanging tissue/panus, her actual trunk and frame are quite small/petite and she is seeking surgical options to help reduce tissue burden - especially panus and mons pubis area  4. Other migraine without status migrainosus, not intractable Stable, well controlled on relpax prn  5. Hidradenitis suppurativa No change  6. Partial intestinal obstruction, unspecified cause (HCC) Recurrent when hernia prolapses, surgically repaired, seeking further surgical intervention, but was unable to get at [facility], going to [facility] denied, [facility] denied her)  7. OSA on CPAP Stable, good compliance  8. Mild intermittent asthma, unspecified whether complicated Stable, well controlled with rare sx managed with SABA  9. Other iron deficiency anemia Labs, h/h has been stable  10. Moderate mixed hyperlipidemia not requiring statin therapy Not on meds, labs done with last CPE, monitoring   F/up for next CPE due in March 2025   Danelle Berry, PA-C 06/04/23 11:36 AM

## 2023-08-11 ENCOUNTER — Inpatient Hospital Stay: Payer: 59 | Attending: Oncology

## 2023-08-11 DIAGNOSIS — Z825 Family history of asthma and other chronic lower respiratory diseases: Secondary | ICD-10-CM | POA: Insufficient documentation

## 2023-08-11 DIAGNOSIS — R0602 Shortness of breath: Secondary | ICD-10-CM | POA: Diagnosis not present

## 2023-08-11 DIAGNOSIS — J45909 Unspecified asthma, uncomplicated: Secondary | ICD-10-CM | POA: Diagnosis not present

## 2023-08-11 DIAGNOSIS — N920 Excessive and frequent menstruation with regular cycle: Secondary | ICD-10-CM | POA: Insufficient documentation

## 2023-08-11 DIAGNOSIS — Z9884 Bariatric surgery status: Secondary | ICD-10-CM | POA: Insufficient documentation

## 2023-08-11 DIAGNOSIS — I809 Phlebitis and thrombophlebitis of unspecified site: Secondary | ICD-10-CM | POA: Insufficient documentation

## 2023-08-11 DIAGNOSIS — Z887 Allergy status to serum and vaccine status: Secondary | ICD-10-CM | POA: Diagnosis not present

## 2023-08-11 DIAGNOSIS — Z806 Family history of leukemia: Secondary | ICD-10-CM | POA: Insufficient documentation

## 2023-08-11 DIAGNOSIS — R5383 Other fatigue: Secondary | ICD-10-CM | POA: Insufficient documentation

## 2023-08-11 DIAGNOSIS — Z833 Family history of diabetes mellitus: Secondary | ICD-10-CM | POA: Diagnosis not present

## 2023-08-11 DIAGNOSIS — Z83438 Family history of other disorder of lipoprotein metabolism and other lipidemia: Secondary | ICD-10-CM | POA: Insufficient documentation

## 2023-08-11 DIAGNOSIS — Z8049 Family history of malignant neoplasm of other genital organs: Secondary | ICD-10-CM | POA: Diagnosis not present

## 2023-08-11 DIAGNOSIS — Z9049 Acquired absence of other specified parts of digestive tract: Secondary | ICD-10-CM | POA: Diagnosis not present

## 2023-08-11 DIAGNOSIS — D509 Iron deficiency anemia, unspecified: Secondary | ICD-10-CM | POA: Insufficient documentation

## 2023-08-11 DIAGNOSIS — G4730 Sleep apnea, unspecified: Secondary | ICD-10-CM | POA: Diagnosis not present

## 2023-08-11 DIAGNOSIS — R29818 Other symptoms and signs involving the nervous system: Secondary | ICD-10-CM | POA: Diagnosis not present

## 2023-08-11 DIAGNOSIS — D508 Other iron deficiency anemias: Secondary | ICD-10-CM

## 2023-08-11 DIAGNOSIS — Z79899 Other long term (current) drug therapy: Secondary | ICD-10-CM | POA: Diagnosis not present

## 2023-08-11 LAB — CBC WITH DIFFERENTIAL (CANCER CENTER ONLY)
Abs Immature Granulocytes: 0.03 10*3/uL (ref 0.00–0.07)
Basophils Absolute: 0.1 10*3/uL (ref 0.0–0.1)
Basophils Relative: 1 %
Eosinophils Absolute: 0.2 10*3/uL (ref 0.0–0.5)
Eosinophils Relative: 3 %
HCT: 37.2 % (ref 36.0–46.0)
Hemoglobin: 11.9 g/dL — ABNORMAL LOW (ref 12.0–15.0)
Immature Granulocytes: 1 %
Lymphocytes Relative: 39 %
Lymphs Abs: 2.5 10*3/uL (ref 0.7–4.0)
MCH: 27.3 pg (ref 26.0–34.0)
MCHC: 32 g/dL (ref 30.0–36.0)
MCV: 85.3 fL (ref 80.0–100.0)
Monocytes Absolute: 0.5 10*3/uL (ref 0.1–1.0)
Monocytes Relative: 8 %
Neutro Abs: 3.2 10*3/uL (ref 1.7–7.7)
Neutrophils Relative %: 48 %
Platelet Count: 281 10*3/uL (ref 150–400)
RBC: 4.36 MIL/uL (ref 3.87–5.11)
RDW: 13.5 % (ref 11.5–15.5)
WBC Count: 6.6 10*3/uL (ref 4.0–10.5)
nRBC: 0 % (ref 0.0–0.2)

## 2023-08-11 LAB — FERRITIN: Ferritin: 23 ng/mL (ref 11–307)

## 2023-08-11 LAB — IRON AND TIBC
Iron: 53 ug/dL (ref 28–170)
Saturation Ratios: 13 % (ref 10.4–31.8)
TIBC: 405 ug/dL (ref 250–450)
UIBC: 352 ug/dL

## 2023-08-13 ENCOUNTER — Encounter: Payer: Self-pay | Admitting: Oncology

## 2023-08-13 ENCOUNTER — Inpatient Hospital Stay: Payer: 59

## 2023-08-13 ENCOUNTER — Inpatient Hospital Stay (HOSPITAL_BASED_OUTPATIENT_CLINIC_OR_DEPARTMENT_OTHER): Payer: 59 | Admitting: Oncology

## 2023-08-13 VITALS — BP 130/77 | HR 72 | Temp 99.5°F | Resp 18 | Wt 312.8 lb

## 2023-08-13 DIAGNOSIS — D508 Other iron deficiency anemias: Secondary | ICD-10-CM | POA: Diagnosis not present

## 2023-08-13 DIAGNOSIS — D509 Iron deficiency anemia, unspecified: Secondary | ICD-10-CM | POA: Diagnosis not present

## 2023-08-13 DIAGNOSIS — N920 Excessive and frequent menstruation with regular cycle: Secondary | ICD-10-CM

## 2023-08-13 DIAGNOSIS — I809 Phlebitis and thrombophlebitis of unspecified site: Secondary | ICD-10-CM | POA: Diagnosis not present

## 2023-08-13 NOTE — Assessment & Plan Note (Signed)
Cervical oncology workup.  Hysterectomy was recommended however cannot be done until pannus resection first.

## 2023-08-13 NOTE — Progress Notes (Signed)
Pt here for follow up. Reports itchiness to lab draw site.

## 2023-08-13 NOTE — Assessment & Plan Note (Signed)
Thrombophlebitis from recent IV site.  Recommend warm compress and monitor symptoms.  Unclear if there is any cellulitis.recommend patient to see PCP if symptoms are worse.

## 2023-08-13 NOTE — Progress Notes (Signed)
Hematology/Oncology Progress note [phone] [phone]         Patient Care Team: Danelle Berry, PA-C as PCP - General (Family Medicine) Rickard Patience, MD as Consulting Physician (Oncology)   REFERRING PROVIDER: Danelle Berry, PA-C  CHIEF COMPLAINTS/REASON FOR VISIT:  Iron deficiency anemia.  ASSESSMENT & PLAN:  IDA (iron deficiency anemia) Due to bariatric surgery and possible chronic blood loss from menorrhagia.  Labs are reviewed and discussed with patient.  Lab Results  Component Value Date   HGB 11.9 (L) 08/11/2023   TIBC 405 08/11/2023   IRONPCTSAT 13 08/11/2023   FERRITIN 23 08/11/2023  Both hemoglobin and iron parameters have improved. Given that her TIBC is still at high normal end, iron saturation at the low normal end, heavy menstrual bleeding and gastric bypass Recommend additional weekly Venofer x 2 to further improve her iron stores.   Menorrhagia Cervical oncology workup.  Hysterectomy was recommended however cannot be done until pannus resection first.   Thrombophlebitis Thrombophlebitis from recent IV site.  Recommend warm compress and monitor symptoms.  Unclear if there is any cellulitis.recommend patient to see PCP if symptoms are worse.   Orders Placed This Encounter  Procedures   CBC with Differential (Cancer Center Only)    Standing Status:   Future    Standing Expiration Date:   08/12/2024   Iron and TIBC    Standing Status:   Future    Standing Expiration Date:   08/12/2024   Ferritin    Standing Status:   Future    Standing Expiration Date:   08/12/2024   Retic Panel    Standing Status:   Future    Standing Expiration Date:   08/12/2024   Follow up in 4 months.  All questions were answered. The patient knows to call the clinic with any problems, questions or concerns.  Rickard Patience, MD, PhD [facility] Hematology Oncology 08/13/2023     HISTORY OF PRESENTING ILLNESS:  Tanya Reeves is a  38 y.o.  female with PMH  listed below who was referred to me for anemia Reviewed patient's recent labs that was done.   abnormal CBC on 2/13, Hb 10.8, mcv 75, elevated B12, normal folate, iron saturation 8, ferriitn 4, TIBC 467 Patient has bariatric surgery in 2022.  + fatigue, shortness of breath on  exertion She denies recent chest pain on exertion,  pre-syncopal episodes, or palpitations She had not noticed any recent bleeding such as epistaxis, hematuria or hematochezia.  + Heavy menstrual period She denies over the counter NSAID ingestion.  She takes bariatric multivitamin and oral iron supplementation.    INTERVAL HISTORY Tanya Reeves is a 37 y.o. female who has above history reviewed by me today presents for follow up visit for iron deficiency anemia.  She tolerated IV Venofer treatments.  She has felt improvement in her fatigue level and neurologic symptoms. Right cubital fossa skin erythematous changes at the site of blood draw few days ago. Patient reports a history of cellulitis at the IV site from her last hospitalization in March 2024.  She was treated with a course of antibiotics prescribed by her dermatologist. Patient denies any fever, chills.   MEDICAL HISTORY:  Past Medical History:  Diagnosis Date   Asthma    Chronic hip pain    Chronic sinusitis 10/02/2016   Essential hypertension, benign 02/26/2016   Gastroesophageal reflux disease without esophagitis 08/26/2018   Hypertension    Incisional hernia with obstruction but no gangrene  Migraines    Morbid obesity (HCC) 03/17/2016   Pre-existing hypertension during pregnancy in third trimester 09/23/2014   Shortness of breath on exertion 08/26/2016   Sleep apnea     SURGICAL HISTORY: Past Surgical History:  Procedure Laterality Date   CESAREAN SECTION     2   CHOLECYSTECTOMY     COLON SURGERY     part of bowel removed due to c-section   INSERTION OF MESH  10/17/2021   Procedure: INSERTION OF MESH;  Surgeon: Henrene Dodge,  MD;  Location: [facility] ORS;  Service: General;;   sips     STOMACH SURGERY     XI ROBOTIC ASSISTED VENTRAL HERNIA N/A 10/17/2021   Procedure: XI ROBOTIC ASSISTED VENTRAL HERNIA, incisional;  Surgeon: Henrene Dodge, MD;  Location: [facility] ORS;  Service: General;  Laterality: N/A;  Incisional hernia    SOCIAL HISTORY: Social History   Socioeconomic History   Marital status: Married    Spouse name: Timothy    Number of children: 2   Years of education: Not on file   Highest education level: Associate degree: academic program  Occupational History   Not on file  Tobacco Use   Smoking status: Never   Smokeless tobacco: Never  Vaping Use   Vaping status: Never Used  Substance and Sexual Activity   Alcohol use: Not Currently    Comment: Social   Drug use: No   Sexual activity: Yes    Partners: Male    Birth control/protection: Surgical    Comment: Tubal Ligation   Other Topics Concern   Not on file  Social History Narrative   Not on file   Social Determinants of Health   Financial Resource Strain: Low Risk  (01/20/2023)   Overall Financial Resource Strain (CARDIA)    Difficulty of Paying Living Expenses: Not hard at all  Food Insecurity: No Food Insecurity (02/01/2023)   Hunger Vital Sign    Worried About Running Out of Food in the Last Year: Never true    Ran Out of Food in the Last Year: Never true  Transportation Needs: No Transportation Needs (02/01/2023)   PRAPARE - Administrator, Civil Service (Medical): No    Lack of Transportation (Non-Medical): No  Physical Activity: Insufficiently Active (01/20/2023)   Exercise Vital Sign    Days of Exercise per Week: 1 day    Minutes of Exercise per Session: 30 min  Stress: No Stress Concern Present (01/20/2023)   [employer] of Occupational Health - Occupational Stress Questionnaire    Feeling of Stress : Not at all  Social Connections: Socially Integrated (01/20/2023)   Social Connection and Isolation Panel [NHANES]     Frequency of Communication with Friends and Family: More than three times a week    Frequency of Social Gatherings with Friends and Family: More than three times a week    Attends Religious Services: 1 to 4 times per year    Active Member of [employer] or Organizations: Yes    Attends Banker Meetings: 1 to 4 times per year    Marital Status: Married  Catering manager Violence: Not At Risk (02/01/2023)   Humiliation, Afraid, Rape, and Kick questionnaire    Fear of Current or Ex-Partner: No    Emotionally Abused: No    Physically Abused: No    Sexually Abused: No    FAMILY HISTORY: Family History  Problem Relation Age of Onset   Diabetes Mother    Cancer Mother  femal organs   Hypoparathyroidism Mother    Asthma Mother    Diabetes Father    Juvenile idiopathic arthritis Daughter    Diabetes Maternal Grandmother    Diabetes Maternal Grandfather    Leukemia Paternal Grandmother    Aneurysm Paternal Grandfather     ALLERGIES:  is allergic to cinnamon and influenza vaccines.  MEDICATIONS:  Current Outpatient Medications  Medication Sig Dispense Refill   eletriptan (RELPAX) 20 MG tablet TAKE 1 TABLET BY MOUTH AS NEEDED FOR MIGRAINE OR HEADACHE. MAY REPEAT IN 2 HOURS IF HEADACHE PERSISTS OR RECURS 6 tablet 5   ferrous sulfate 325 (65 FE) MG tablet Take by mouth.     Multiple Vitamins-Minerals (BARIATRIC MULTIVITAMINS/IRON) CAPS Take 1 capsule by mouth daily.     ondansetron (ZOFRAN ODT) 4 MG disintegrating tablet Take 1 tablet (4 mg total) by mouth every 8 (eight) hours as needed for nausea or vomiting. 30 tablet 0   No current facility-administered medications for this visit.    Review of Systems  Constitutional:  Positive for fatigue. Negative for appetite change, chills and fever.  HENT:   Negative for hearing loss and voice change.   Eyes:  Negative for eye problems.  Respiratory:  Negative for chest tightness and cough.   Cardiovascular:  Negative for chest  pain.  Gastrointestinal:  Negative for abdominal distention, abdominal pain and blood in stool.  Endocrine: Negative for hot flashes.  Genitourinary:  Positive for menstrual problem. Negative for difficulty urinating and frequency.   Musculoskeletal:  Negative for arthralgias.  Skin:  Negative for itching and rash.  Neurological:  Negative for extremity weakness.  Hematological:  Negative for adenopathy.  Psychiatric/Behavioral:  Negative for confusion.     PHYSICAL EXAMINATION: ECOG PERFORMANCE STATUS: 1 - Symptomatic but completely ambulatory Vitals:   08/13/23 1307  BP: 130/77  Pulse: 72  Resp: 18  Temp: 99.5 F (37.5 C)   Filed Weights   08/13/23 1307  Weight: (!) 312 lb 12.8 oz (141.9 kg)    Physical Exam Constitutional:      General: She is not in acute distress. HENT:     Head: Normocephalic and atraumatic.  Eyes:     General: No scleral icterus. Cardiovascular:     Rate and Rhythm: Normal rate and regular rhythm.     Heart sounds: Normal heart sounds.  Pulmonary:     Effort: Pulmonary effort is normal. No respiratory distress.  Abdominal:     General: There is no distension.  Musculoskeletal:        General: No deformity. Normal range of motion.     Cervical back: Normal range of motion and neck supple.  Skin:    General: Skin is warm and dry.  Neurological:     Mental Status: She is alert and oriented to person, place, and time. Mental status is at baseline.     Cranial Nerves: No cranial nerve deficit.  Psychiatric:        Mood and Affect: Mood normal.   A 1cm area of nodular cord with mild tenderness, associated with 3-4 cm area of erythematous changes.    LABORATORY DATA:  I have reviewed the data as listed    Latest Ref Rng & Units 08/11/2023    1:24 PM 04/04/2023    8:16 AM 02/02/2023    6:01 AM  CBC  WBC 4.0 - 10.5 K/uL 6.6  6.1  5.9   Hemoglobin 12.0 - 15.0 g/dL 10.2  72.5  9.0  Hematocrit 36.0 - 46.0 % 37.2  39.2  28.6   Platelets 150 -  400 K/uL 281  245  220       Latest Ref Rng & Units 02/02/2023    6:01 AM 02/01/2023    5:00 AM 01/31/2023   11:05 PM  CMP  Glucose 70 - 99 mg/dL 147   829   BUN 6 - 20 mg/dL <5   13   Creatinine 5.62 - 1.00 mg/dL 1.30  8.65  7.84   Sodium 135 - 145 mmol/L 136   139   Potassium 3.5 - 5.1 mmol/L 3.2   3.4   Chloride 98 - 111 mmol/L 107   106   CO2 22 - 32 mmol/L 24   24   Calcium 8.9 - 10.3 mg/dL 8.2   8.9   Total Protein 6.5 - 8.1 g/dL   7.3   Total Bilirubin 0.3 - 1.2 mg/dL   0.5   Alkaline Phos 38 - 126 U/L   54   AST 15 - 41 U/L   16   ALT 0 - 44 U/L   21       Component Value Date/Time   IRON 53 08/11/2023 1324   TIBC 405 08/11/2023 1324   FERRITIN 23 08/11/2023 1324   IRONPCTSAT 13 08/11/2023 1324   IRONPCTSAT 8 (L) 12/31/2022 0911     RADIOGRAPHIC STUDIES: I have personally reviewed the radiological images as listed and agreed with the findings in the report. No results found.

## 2023-08-13 NOTE — Assessment & Plan Note (Addendum)
Due to bariatric surgery and possible chronic blood loss from menorrhagia.  Labs are reviewed and discussed with patient.  Lab Results  Component Value Date   HGB 11.9 (L) 08/11/2023   TIBC 405 08/11/2023   IRONPCTSAT 13 08/11/2023   FERRITIN 23 08/11/2023  Both hemoglobin and iron parameters have improved. Given that her TIBC is still at high normal end, iron saturation at the low normal end, heavy menstrual bleeding and gastric bypass Recommend additional weekly Venofer x 2 to further improve her iron stores.

## 2023-09-10 ENCOUNTER — Inpatient Hospital Stay: Payer: 59 | Attending: Oncology

## 2023-09-10 VITALS — BP 152/82 | HR 65 | Temp 98.0°F | Resp 18

## 2023-09-10 DIAGNOSIS — Z79899 Other long term (current) drug therapy: Secondary | ICD-10-CM | POA: Insufficient documentation

## 2023-09-10 DIAGNOSIS — D509 Iron deficiency anemia, unspecified: Secondary | ICD-10-CM | POA: Diagnosis present

## 2023-09-10 DIAGNOSIS — D508 Other iron deficiency anemias: Secondary | ICD-10-CM

## 2023-09-10 MED ORDER — IRON SUCROSE 20 MG/ML IV SOLN
200.0000 mg | Freq: Once | INTRAVENOUS | Status: AC
Start: 2023-09-10 — End: 2023-09-10
  Administered 2023-09-10: 200 mg via INTRAVENOUS
  Filled 2023-09-10: qty 10

## 2023-09-10 NOTE — Patient Instructions (Signed)
Iron Sucrose Injection What is this medication? IRON SUCROSE (EYE ern SOO krose) treats low levels of iron (iron deficiency anemia) in people with kidney disease. Iron is a mineral that plays an important role in making red blood cells, which carry oxygen from your lungs to the rest of your body. This medicine may be used for other purposes; ask your health care provider or pharmacist if you have questions. COMMON BRAND NAME(S): Venofer What should I tell my care team before I take this medication? They need to know if you have any of these conditions: Anemia not caused by low iron levels Heart disease High levels of iron in the blood Kidney disease Liver disease An unusual or allergic reaction to iron, other medications, foods, dyes, or preservatives Pregnant or trying to get pregnant Breastfeeding How should I use this medication? This medication is for infusion into a vein. It is given in a hospital or clinic setting. Talk to your care team about the use of this medication in children. While this medication may be prescribed for children as young as 2 years for selected conditions, precautions do apply. Overdosage: If you think you have taken too much of this medicine contact a poison control center or emergency room at once. NOTE: This medicine is only for you. Do not share this medicine with others. What if I miss a dose? Keep appointments for follow-up doses. It is important not to miss your dose. Call your care team if you are unable to keep an appointment. What may interact with this medication? Do not take this medication with any of the following: Deferoxamine Dimercaprol Other iron products This medication may also interact with the following: Chloramphenicol Deferasirox This list may not describe all possible interactions. Give your health care provider a list of all the medicines, herbs, non-prescription drugs, or dietary supplements you use. Also tell them if you smoke,  drink alcohol, or use illegal drugs. Some items may interact with your medicine. What should I watch for while using this medication? Visit your care team regularly. Tell your care team if your symptoms do not start to get better or if they get worse. You may need blood work done while you are taking this medication. You may need to follow a special diet. Talk to your care team. Foods that contain iron include: whole grains/cereals, dried fruits, beans, or peas, leafy green vegetables, and organ meats (liver, kidney). What side effects may I notice from receiving this medication? Side effects that you should report to your care team as soon as possible: Allergic reactions--skin rash, itching, hives, swelling of the face, lips, tongue, or throat Low blood pressure--dizziness, feeling faint or lightheaded, blurry vision Shortness of breath Side effects that usually do not require medical attention (report to your care team if they continue or are bothersome): Flushing Headache Joint pain Muscle pain Nausea Pain, redness, or irritation at injection site This list may not describe all possible side effects. Call your doctor for medical advice about side effects. You may report side effects to FDA at 1-800-FDA-1088. Where should I keep my medication? This medication is given in a hospital or clinic. It will not be stored at home. NOTE: This sheet is a summary. It may not cover all possible information. If you have questions about this medicine, talk to your doctor, pharmacist, or health care provider.  2024 Elsevier/Gold Standard (2023-04-11 00:00:00)

## 2023-09-17 ENCOUNTER — Inpatient Hospital Stay: Payer: 59

## 2023-09-17 VITALS — BP 125/58 | HR 88 | Temp 98.2°F | Resp 16

## 2023-09-17 DIAGNOSIS — D509 Iron deficiency anemia, unspecified: Secondary | ICD-10-CM | POA: Diagnosis not present

## 2023-09-17 DIAGNOSIS — D508 Other iron deficiency anemias: Secondary | ICD-10-CM

## 2023-09-17 MED ORDER — IRON SUCROSE 20 MG/ML IV SOLN
200.0000 mg | Freq: Once | INTRAVENOUS | Status: AC
  Administered 2023-09-17: 200 mg via INTRAVENOUS
  Filled 2023-09-17: qty 10

## 2023-09-17 NOTE — Progress Notes (Signed)
Declined 30 minute post observation. Aware of risks. Vitals stable at discharge.  

## 2023-09-23 ENCOUNTER — Telehealth: Payer: Self-pay

## 2023-09-23 NOTE — Telephone Encounter (Signed)
No answer from pt left detailed vm:  We received some CPAP Machine/Supplies order to be filled out. I do not see where we have filled out these forms before but she was referred to pulmonology in 2017. Is she still seeing them? Do they manage her CPAP?

## 2023-09-23 NOTE — Telephone Encounter (Signed)
Per pt she wants me to disregard these papers. She has not requested any supplies from [employer]. Pt states she does not want anything signed for them due to she does not need anything for CPAP machine and she has everything on her end.

## 2023-09-23 NOTE — Telephone Encounter (Signed)
Pt called back saying that she does need the order to be signed by Sheliah Mends.

## 2023-10-07 ENCOUNTER — Ambulatory Visit: Payer: 59 | Admitting: Family Medicine

## 2023-10-07 ENCOUNTER — Telehealth: Payer: 59 | Admitting: Physician Assistant

## 2023-10-07 DIAGNOSIS — J019 Acute sinusitis, unspecified: Secondary | ICD-10-CM | POA: Diagnosis not present

## 2023-10-07 MED ORDER — PREDNISONE 20 MG PO TABS: ORAL_TABLET | ORAL | 0 refills | Status: DC

## 2023-10-07 MED ORDER — AZITHROMYCIN 250 MG PO TABS: ORAL_TABLET | ORAL | 0 refills | Status: DC

## 2023-10-07 NOTE — Progress Notes (Unsigned)
Virtual Visit via Video Note  I connected with Tanya Reeves on 10/08/23 at  1:40 PM EST by a video enabled telemedicine application and verified that I am speaking with the correct person using two identifiers.  Today's Provider: Jacquelin Reeves, MHS, PA-C Introduced myself to the patient as a PA-C and provided education on APPs in clinical practice.   Location: Patient: at home  Provider: [facility], [place], [place]   I discussed the limitations of evaluation and management by telemedicine and the availability of in person appointments. The patient expressed understanding and agreed to proceed.   Chief Complaint  Patient presents with   Cough   Generalized Body Aches   Nasal Congestion    Post nasal drip onset for 4 days, no home COVID test    History of Present Illness:   URI -type symptoms  Onset: sudden  Duration: ongoing for about 3-4 days  Associated symptoms: postnasal drainage, productive cough, body aches, subjective fever, chills, watery eyes, hoarse voice  Symptoms are worse at night when she is at rest, less severe during the day when she is active  She reports her O2 sat is fine at home >95%   Intervention: Nettipot, OTC Tylenol  cough and cold  medication, antihistamine, mucinex, inhaler  Inhaler does not seem to help with SOB   Recent sick contacts: she reports her daughter had an ear infection- was seen last week by provider and started on Abx but is not feeling much better  (was negative for RSV, Flu and COVID) Recent travel: none  COVID testing at home: tested today   Result: negative    Review of Systems  Constitutional:  Positive for chills, fever and malaise/fatigue.  HENT:  Positive for congestion and sinus pain (tender).   Respiratory:  Positive for cough, sputum production and shortness of breath.   Gastrointestinal:  Negative for diarrhea, nausea and vomiting.    Observations/Objective:   Due to the nature of the virtual  visit, physical exam and observations are limited. Able to obtain the following observations:  Alert, oriented, x3 Appears comfortable, in no acute distress.  No scleral injection, tachypnea, wheeze or strider. Able to maintain conversation without visible strain.  Mild hoarse voice and  cough appreciated during visit.    Assessment and Plan:  Problem List Items Addressed This Visit   None Visit Diagnoses     Acute sinusitis, recurrence not specified, unspecified location    -  Primary   Relevant Medications   predniSONE (DELTASONE) 20 MG tablet   azithromycin (ZITHROMAX) 250 MG tablet     Acute, new concern Reports symptoms comprised of postnasal drainage, productive cough, body aches, subjective fever, chills, watery eyes, hoarse voice for the past 3-4 days that are not improving with home measures Will provide Zpack and Prednisone taper to assist with symptoms  Reviewed ED and return precautions Follow up as needed for persistent or progressing symptoms     Follow Up Instructions:    I discussed the assessment and treatment plan with the patient. The patient was provided an opportunity to ask questions and all were answered. The patient agreed with the plan and demonstrated an understanding of the instructions.   The patient was advised to call back or seek an in-person evaluation if the symptoms worsen or if the condition fails to improve as anticipated.  I provided 10 minutes of non-face-to-face time during this encounter.  No follow-ups on file.   I, Tanya Almquist E Algie Cales, PA-C,  have reviewed all documentation for this visit. The documentation on 10/08/23 for the exam, diagnosis, procedures, and orders are all accurate and complete.   Tanya Reeves, MHS, PA-C [facility]

## 2023-10-23 ENCOUNTER — Ambulatory Visit: Payer: 59 | Admitting: Physician Assistant

## 2024-02-09 ENCOUNTER — Inpatient Hospital Stay: Payer: 59 | Attending: Oncology

## 2024-02-09 DIAGNOSIS — Z9884 Bariatric surgery status: Secondary | ICD-10-CM | POA: Insufficient documentation

## 2024-02-09 DIAGNOSIS — Z806 Family history of leukemia: Secondary | ICD-10-CM | POA: Insufficient documentation

## 2024-02-09 DIAGNOSIS — Z8261 Family history of arthritis: Secondary | ICD-10-CM | POA: Insufficient documentation

## 2024-02-09 DIAGNOSIS — D509 Iron deficiency anemia, unspecified: Secondary | ICD-10-CM | POA: Insufficient documentation

## 2024-02-09 DIAGNOSIS — Z79899 Other long term (current) drug therapy: Secondary | ICD-10-CM | POA: Diagnosis not present

## 2024-02-09 DIAGNOSIS — Z887 Allergy status to serum and vaccine status: Secondary | ICD-10-CM | POA: Diagnosis not present

## 2024-02-09 DIAGNOSIS — Z833 Family history of diabetes mellitus: Secondary | ICD-10-CM | POA: Insufficient documentation

## 2024-02-09 DIAGNOSIS — D508 Other iron deficiency anemias: Secondary | ICD-10-CM

## 2024-02-09 DIAGNOSIS — G4730 Sleep apnea, unspecified: Secondary | ICD-10-CM | POA: Diagnosis not present

## 2024-02-09 DIAGNOSIS — Z8349 Family history of other endocrine, nutritional and metabolic diseases: Secondary | ICD-10-CM | POA: Diagnosis not present

## 2024-02-09 DIAGNOSIS — J45909 Unspecified asthma, uncomplicated: Secondary | ICD-10-CM | POA: Insufficient documentation

## 2024-02-09 DIAGNOSIS — Z7952 Long term (current) use of systemic steroids: Secondary | ICD-10-CM | POA: Insufficient documentation

## 2024-02-09 DIAGNOSIS — R5383 Other fatigue: Secondary | ICD-10-CM | POA: Insufficient documentation

## 2024-02-09 DIAGNOSIS — N920 Excessive and frequent menstruation with regular cycle: Secondary | ICD-10-CM | POA: Insufficient documentation

## 2024-02-09 DIAGNOSIS — Z9049 Acquired absence of other specified parts of digestive tract: Secondary | ICD-10-CM | POA: Insufficient documentation

## 2024-02-09 DIAGNOSIS — Z8049 Family history of malignant neoplasm of other genital organs: Secondary | ICD-10-CM | POA: Insufficient documentation

## 2024-02-09 LAB — RETIC PANEL
Immature Retic Fract: 10.2 % (ref 2.3–15.9)
RBC.: 4.39 MIL/uL (ref 3.87–5.11)
Retic Count, Absolute: 66.3 10*3/uL (ref 19.0–186.0)
Retic Ct Pct: 1.5 % (ref 0.4–3.1)
Reticulocyte Hemoglobin: 30.6 pg (ref >27.9)

## 2024-02-09 LAB — CBC WITH DIFFERENTIAL (CANCER CENTER ONLY)
Abs Immature Granulocytes: 0.09 10*3/uL — ABNORMAL HIGH (ref 0.00–0.07)
Basophils Absolute: 0.1 10*3/uL (ref 0.0–0.1)
Basophils Relative: 1 %
Eosinophils Absolute: 0.2 10*3/uL (ref 0.0–0.5)
Eosinophils Relative: 3 %
HCT: 37.7 % (ref 36.0–46.0)
Hemoglobin: 12.1 g/dL (ref 12.0–15.0)
Immature Granulocytes: 1 %
Lymphocytes Relative: 30 %
Lymphs Abs: 2.1 10*3/uL (ref 0.7–4.0)
MCH: 27.5 pg (ref 26.0–34.0)
MCHC: 32.1 g/dL (ref 30.0–36.0)
MCV: 85.7 fL (ref 80.0–100.0)
Monocytes Absolute: 0.3 10*3/uL (ref 0.1–1.0)
Monocytes Relative: 5 %
Neutro Abs: 4.3 10*3/uL (ref 1.7–7.7)
Neutrophils Relative %: 60 %
Platelet Count: 241 10*3/uL (ref 150–400)
RBC: 4.4 MIL/uL (ref 3.87–5.11)
RDW: 14 % (ref 11.5–15.5)
WBC Count: 7.1 10*3/uL (ref 4.0–10.5)
nRBC: 0 % (ref 0.0–0.2)

## 2024-02-09 LAB — IRON AND TIBC
Iron: 64 ug/dL (ref 28–170)
Saturation Ratios: 16 % (ref 10.4–31.8)
TIBC: 395 ug/dL (ref 250–450)
UIBC: 331 ug/dL

## 2024-02-09 LAB — FERRITIN: Ferritin: 37 ng/mL (ref 11–307)

## 2024-02-11 ENCOUNTER — Inpatient Hospital Stay (HOSPITAL_BASED_OUTPATIENT_CLINIC_OR_DEPARTMENT_OTHER): Payer: 59 | Admitting: Oncology

## 2024-02-11 ENCOUNTER — Inpatient Hospital Stay: Payer: 59

## 2024-02-11 ENCOUNTER — Encounter: Payer: Self-pay | Admitting: Oncology

## 2024-02-11 VITALS — BP 135/77 | HR 83 | Temp 98.4°F | Resp 18 | Wt 286.0 lb

## 2024-02-11 DIAGNOSIS — D509 Iron deficiency anemia, unspecified: Secondary | ICD-10-CM | POA: Diagnosis not present

## 2024-02-11 DIAGNOSIS — D508 Other iron deficiency anemias: Secondary | ICD-10-CM

## 2024-02-11 MED ORDER — IRON SUCROSE 20 MG/ML IV SOLN
200.0000 mg | Freq: Once | INTRAVENOUS | Status: AC
  Administered 2024-02-11: 200 mg via INTRAVENOUS

## 2024-02-11 NOTE — Progress Notes (Signed)
 Hematology/Oncology Progress note [phone] [phone]         Patient Care Team: Danelle Berry, PA-C as PCP - General (Family Medicine) Rickard Patience, MD as Consulting Physician (Oncology)   REFERRING PROVIDER: Danelle Berry, PA-C  CHIEF COMPLAINTS/REASON FOR VISIT:  Iron deficiency anemia.  ASSESSMENT & PLAN:  IDA (iron deficiency anemia) Due to bariatric surgery and possible chronic blood loss from menorrhagia.  Labs are reviewed and discussed with patient.  Lab Results  Component Value Date   HGB 12.1 02/09/2024   TIBC 395 02/09/2024   IRONPCTSAT 16 02/09/2024   FERRITIN 37 02/09/2024  Both hemoglobin and iron parameters have improved. Proceed with maintenance IV Venofer x 1 for history of gastric bypass.   Orders Placed This Encounter  Procedures   CBC with Differential (Cancer Center Only)    Standing Status:   Future    Expected Date:   08/13/2024    Expiration Date:   02/10/2025   Iron and TIBC    Standing Status:   Future    Expected Date:   08/13/2024    Expiration Date:   02/10/2025   Ferritin    Standing Status:   Future    Expected Date:   08/13/2024    Expiration Date:   02/10/2025   Retic Panel    Standing Status:   Future    Expected Date:   08/13/2024    Expiration Date:   02/10/2025   Follow up in 6 months.  All questions were answered. The patient knows to call the clinic with any problems, questions or concerns.  Rickard Patience, MD, PhD [facility] Oncology 02/11/2024     HISTORY OF PRESENTING ILLNESS:  DANESE DORSAINVIL is a  38 y.o.  female with PMH listed below who was referred to me for anemia Reviewed patient's recent labs that was done.   abnormal CBC on 2/13, Hb 10.8, mcv 75, elevated B12, normal folate, iron saturation 8, ferriitn 4, TIBC 467 Patient has bariatric surgery in 2022.  + fatigue, shortness of breath on  exertion She denies recent chest pain on exertion,  pre-syncopal episodes, or  palpitations She had not noticed any recent bleeding such as epistaxis, hematuria or hematochezia.  + Heavy menstrual period She denies over the counter NSAID ingestion.  She takes bariatric multivitamin and oral iron supplementation.    INTERVAL HISTORY AVIS TIRONE is a 38 y.o. female who has above history reviewed by me today presents for follow up visit for iron deficiency anemia.  She tolerated IV Venofer treatments.  She has felt improvement in her fatigue level. Patient has no new complaints.  MEDICAL HISTORY:  Past Medical History:  Diagnosis Date   Asthma    Chronic hip pain    Chronic sinusitis 10/02/2016   Essential hypertension, benign 02/26/2016   Gastroesophageal reflux disease without esophagitis 08/26/2018   Hypertension    Incisional hernia with obstruction but no gangrene    Migraines    Morbid obesity (HCC) 03/17/2016   Pre-existing hypertension during pregnancy in third trimester 09/23/2014   Shortness of breath on exertion 08/26/2016   Sleep apnea     SURGICAL HISTORY: Past Surgical History:  Procedure Laterality Date   CESAREAN SECTION     2   CHOLECYSTECTOMY     COLON SURGERY     part of bowel removed due to c-section   INSERTION OF MESH  10/17/2021   Procedure: INSERTION OF MESH;  Surgeon: Henrene Dodge, MD;  Location: [facility] ORS;  Service: General;;   sips     STOMACH SURGERY     XI ROBOTIC ASSISTED VENTRAL HERNIA N/A 10/17/2021   Procedure: XI ROBOTIC ASSISTED VENTRAL HERNIA, incisional;  Surgeon: Henrene Dodge, MD;  Location: [facility] ORS;  Service: General;  Laterality: N/A;  Incisional hernia    SOCIAL HISTORY: Social History   Socioeconomic History   Marital status: Married    Spouse name: Timothy    Number of children: 2   Years of education: Not on file   Highest education level: Associate degree: academic program  Occupational History   Not on file  Tobacco Use   Smoking status: Never   Smokeless tobacco: Never  Vaping Use    Vaping status: Never Used  Substance and Sexual Activity   Alcohol use: Not Currently    Comment: Social   Drug use: No   Sexual activity: Yes    Partners: Male    Birth control/protection: Surgical    Comment: Tubal Ligation   Other Topics Concern   Not on file  Social History Narrative   Not on file   Social Drivers of Health   Financial Resource Strain: Low Risk  (01/07/2024)   Received [facility]   Overall Financial Resource Strain (CARDIA)    Difficulty of Paying Living Expenses: Not very hard  Food Insecurity: No Food Insecurity (01/07/2024)   Received from [facility]   Hunger Vital Sign    Worried About Running Out of Food in the Last Year: Never true    Ran Out of Food in the Last Year: Never true  Transportation Needs: No Transportation Needs (01/07/2024)   Received from [facility]   PRAPARE - Transportation    Lack of Transportation (Medical): No    Lack of Transportation (Non-Medical): No  Physical Activity: Insufficiently Active (01/20/2023)   Exercise Vital Sign    Days of Exercise per Week: 1 day    Minutes of Exercise per Session: 30 min  Stress: No Stress Concern Present (01/20/2023)   [employer] of Occupational Health - Occupational Stress Questionnaire    Feeling of Stress : Not at all  Social Connections: Socially Integrated (01/20/2023)   Social Connection and Isolation Panel [NHANES]    Frequency of Communication with Friends and Family: More than three times a week    Frequency of Social Gatherings with Friends and Family: More than three times a week    Attends Religious Services: 1 to 4 times per year    Active Member of [employer] or Organizations: Yes    Attends Banker Meetings: 1 to 4 times per year    Marital Status: Married  Catering manager Violence: Not At Risk (02/01/2023)   Humiliation, Afraid, Rape, and Kick questionnaire    Fear of Current or Ex-Partner: No    Emotionally Abused: No    Physically Abused: No     Sexually Abused: No    FAMILY HISTORY: Family History  Problem Relation Age of Onset   Diabetes Mother    Cancer Mother        femal organs   Hypoparathyroidism Mother    Asthma Mother    Diabetes Father    Juvenile idiopathic arthritis Daughter    Diabetes Maternal Grandmother    Diabetes Maternal Grandfather    Leukemia Paternal Grandmother    Aneurysm Paternal Grandfather     ALLERGIES:  is allergic to cinnamon and influenza vaccines.  MEDICATIONS:  Current Outpatient  Medications  Medication Sig Dispense Refill   eletriptan (RELPAX) 20 MG tablet TAKE 1 TABLET BY MOUTH AS NEEDED FOR MIGRAINE OR HEADACHE. MAY REPEAT IN 2 HOURS IF HEADACHE PERSISTS OR RECURS 6 tablet 5   Multiple Vitamins-Minerals (BARIATRIC MULTIVITAMINS/IRON) CAPS Take 1 capsule by mouth daily.     ondansetron (ZOFRAN ODT) 4 MG disintegrating tablet Take 1 tablet (4 mg total) by mouth every 8 (eight) hours as needed for nausea or vomiting. 30 tablet 0   azithromycin (ZITHROMAX) 250 MG tablet Take 500mg  PO daily x1d and then 250mg  daily x4 days (Patient not taking: Reported on 02/11/2024) 6 each 0   ferrous sulfate 325 (65 FE) MG tablet Take by mouth. (Patient not taking: Reported on 10/07/2023)     predniSONE (DELTASONE) 20 MG tablet Take 60mg  PO daily x 2 days, then40mg  PO daily x 2 days, then 20mg  PO daily x 3 days (Patient not taking: Reported on 02/11/2024) 13 tablet 0   No current facility-administered medications for this visit.    Review of Systems  Constitutional:  Positive for fatigue. Negative for appetite change, chills and fever.  HENT:   Negative for hearing loss and voice change.   Eyes:  Negative for eye problems.  Respiratory:  Negative for chest tightness and cough.   Cardiovascular:  Negative for chest pain.  Gastrointestinal:  Negative for abdominal distention, abdominal pain and blood in stool.  Endocrine: Negative for hot flashes.  Genitourinary:  Positive for menstrual problem.  Negative for difficulty urinating and frequency.   Musculoskeletal:  Negative for arthralgias.  Skin:  Negative for itching and rash.  Neurological:  Negative for extremity weakness.  Hematological:  Negative for adenopathy.  Psychiatric/Behavioral:  Negative for confusion.     PHYSICAL EXAMINATION: ECOG PERFORMANCE STATUS: 1 - Symptomatic but completely ambulatory Vitals:   02/11/24 1306  BP: 135/77  Pulse: 83  Resp: 18  Temp: 98.4 F (36.9 C)  SpO2: 98%   Filed Weights   02/11/24 1306  Weight: 286 lb (129.7 kg)    Physical Exam Constitutional:      General: She is not in acute distress. HENT:     Head: Normocephalic and atraumatic.  Eyes:     General: No scleral icterus. Cardiovascular:     Rate and Rhythm: Normal rate and regular rhythm.  Pulmonary:     Effort: Pulmonary effort is normal. No respiratory distress.  Abdominal:     Palpations: Abdomen is soft.  Musculoskeletal:        General: No deformity. Normal range of motion.     Cervical back: Normal range of motion and neck supple.  Skin:    General: Skin is warm and dry.  Neurological:     Mental Status: She is alert and oriented to person, place, and time. Mental status is at baseline.     Cranial Nerves: No cranial nerve deficit.  Psychiatric:        Mood and Affect: Mood normal.     LABORATORY DATA:  I have reviewed the data as listed    Latest Ref Rng & Units 02/09/2024   10:30 AM 08/11/2023    1:24 PM 04/04/2023    8:16 AM  CBC  WBC 4.0 - 10.5 K/uL 7.1  6.6  6.1   Hemoglobin 12.0 - 15.0 g/dL 47.8  29.5  62.1   Hematocrit 36.0 - 46.0 % 37.7  37.2  39.2   Platelets 150 - 400 K/uL 241  281  245  Latest Ref Rng & Units 02/02/2023    6:01 AM 02/01/2023    5:00 AM 01/31/2023   11:05 PM  CMP  Glucose 70 - 99 mg/dL 161   096   BUN 6 - 20 mg/dL <5   13   Creatinine 0.45 - 1.00 mg/dL 4.09  8.11  9.14   Sodium 135 - 145 mmol/L 136   139   Potassium 3.5 - 5.1 mmol/L 3.2   3.4   Chloride 98 -  111 mmol/L 107   106   CO2 22 - 32 mmol/L 24   24   Calcium 8.9 - 10.3 mg/dL 8.2   8.9   Total Protein 6.5 - 8.1 g/dL   7.3   Total Bilirubin 0.3 - 1.2 mg/dL   0.5   Alkaline Phos 38 - 126 U/L   54   AST 15 - 41 U/L   16   ALT 0 - 44 U/L   21       Component Value Date/Time   IRON 64 02/09/2024 1030   TIBC 395 02/09/2024 1030   FERRITIN 37 02/09/2024 1030   IRONPCTSAT 16 02/09/2024 1030   IRONPCTSAT 8 (L) 12/31/2022 0911     RADIOGRAPHIC STUDIES: I have personally reviewed the radiological images as listed and agreed with the findings in the report. No results found.

## 2024-02-11 NOTE — Assessment & Plan Note (Addendum)
 Due to bariatric surgery and possible chronic blood loss from menorrhagia.  Labs are reviewed and discussed with patient.  Lab Results  Component Value Date   HGB 12.1 02/09/2024   TIBC 395 02/09/2024   IRONPCTSAT 16 02/09/2024   FERRITIN 37 02/09/2024  Both hemoglobin and iron parameters have improved. Proceed with maintenance IV Venofer x 1 for history of gastric bypass.

## 2024-04-05 ENCOUNTER — Other Ambulatory Visit: Payer: Self-pay

## 2024-04-05 ENCOUNTER — Telehealth: Payer: Self-pay

## 2024-04-05 DIAGNOSIS — G43009 Migraine without aura, not intractable, without status migrainosus: Secondary | ICD-10-CM

## 2024-04-05 MED ORDER — ELETRIPTAN HYDROBROMIDE 20 MG PO TABS: ORAL_TABLET | ORAL | 0 refills | Status: DC

## 2024-04-05 NOTE — Telephone Encounter (Signed)
 Refill request on   eletriptan  (RELPAX ) 20 MG tablet

## 2024-04-05 NOTE — Telephone Encounter (Signed)
 Pt has scheduled an appt in June

## 2024-04-05 NOTE — Telephone Encounter (Signed)
 Refill sent to cover until appt

## 2024-04-28 ENCOUNTER — Ambulatory Visit: Admitting: Family Medicine

## 2024-06-02 ENCOUNTER — Ambulatory Visit: Admitting: Family Medicine

## 2024-08-16 ENCOUNTER — Inpatient Hospital Stay: Attending: Oncology

## 2024-08-16 DIAGNOSIS — Z9884 Bariatric surgery status: Secondary | ICD-10-CM | POA: Insufficient documentation

## 2024-08-16 DIAGNOSIS — D509 Iron deficiency anemia, unspecified: Secondary | ICD-10-CM | POA: Diagnosis present

## 2024-08-16 DIAGNOSIS — N920 Excessive and frequent menstruation with regular cycle: Secondary | ICD-10-CM | POA: Insufficient documentation

## 2024-08-16 DIAGNOSIS — D508 Other iron deficiency anemias: Secondary | ICD-10-CM

## 2024-08-16 LAB — CBC WITH DIFFERENTIAL (CANCER CENTER ONLY)
Abs Immature Granulocytes: 0.05 K/uL (ref 0.00–0.07)
Basophils Absolute: 0 K/uL (ref 0.0–0.1)
Basophils Relative: 0 %
Eosinophils Absolute: 0.2 K/uL (ref 0.0–0.5)
Eosinophils Relative: 3 %
HCT: 36.5 % (ref 36.0–46.0)
Hemoglobin: 11.5 g/dL — ABNORMAL LOW (ref 12.0–15.0)
Immature Granulocytes: 1 %
Lymphocytes Relative: 37 %
Lymphs Abs: 2.5 K/uL (ref 0.7–4.0)
MCH: 26.4 pg (ref 26.0–34.0)
MCHC: 31.5 g/dL (ref 30.0–36.0)
MCV: 83.9 fL (ref 80.0–100.0)
Monocytes Absolute: 0.4 K/uL (ref 0.1–1.0)
Monocytes Relative: 6 %
Neutro Abs: 3.7 K/uL (ref 1.7–7.7)
Neutrophils Relative %: 53 %
Platelet Count: 261 K/uL (ref 150–400)
RBC: 4.35 MIL/uL (ref 3.87–5.11)
RDW: 13.6 % (ref 11.5–15.5)
WBC Count: 6.9 K/uL (ref 4.0–10.5)
nRBC: 0 % (ref 0.0–0.2)

## 2024-08-16 LAB — RETIC PANEL
Immature Retic Fract: 14.5 % (ref 2.3–15.9)
RBC.: 4.35 MIL/uL (ref 3.87–5.11)
Retic Count, Absolute: 66.1 K/uL (ref 19.0–186.0)
Retic Ct Pct: 1.5 % (ref 0.4–3.1)
Reticulocyte Hemoglobin: 30.3 pg (ref >27.9)

## 2024-08-16 LAB — IRON AND TIBC
Iron: 54 ug/dL (ref 28–170)
Saturation Ratios: 13 % (ref 10.4–31.8)
TIBC: 402 ug/dL (ref 250–450)
UIBC: 348 ug/dL

## 2024-08-16 LAB — FERRITIN: Ferritin: 15 ng/mL (ref 11–307)

## 2024-08-18 ENCOUNTER — Encounter: Payer: Self-pay | Admitting: Oncology

## 2024-08-18 ENCOUNTER — Inpatient Hospital Stay: Attending: Oncology | Admitting: Oncology

## 2024-08-18 ENCOUNTER — Inpatient Hospital Stay

## 2024-08-18 VITALS — BP 128/72 | HR 80 | Temp 98.9°F | Resp 16 | Wt 275.0 lb

## 2024-08-18 VITALS — BP 120/66 | HR 76 | Resp 18

## 2024-08-18 DIAGNOSIS — Z79899 Other long term (current) drug therapy: Secondary | ICD-10-CM | POA: Insufficient documentation

## 2024-08-18 DIAGNOSIS — Z9884 Bariatric surgery status: Secondary | ICD-10-CM | POA: Diagnosis not present

## 2024-08-18 DIAGNOSIS — Z833 Family history of diabetes mellitus: Secondary | ICD-10-CM | POA: Diagnosis not present

## 2024-08-18 DIAGNOSIS — R0602 Shortness of breath: Secondary | ICD-10-CM | POA: Insufficient documentation

## 2024-08-18 DIAGNOSIS — Z9049 Acquired absence of other specified parts of digestive tract: Secondary | ICD-10-CM | POA: Diagnosis not present

## 2024-08-18 DIAGNOSIS — Z8269 Family history of other diseases of the musculoskeletal system and connective tissue: Secondary | ICD-10-CM | POA: Insufficient documentation

## 2024-08-18 DIAGNOSIS — Z8249 Family history of ischemic heart disease and other diseases of the circulatory system: Secondary | ICD-10-CM | POA: Insufficient documentation

## 2024-08-18 DIAGNOSIS — G4730 Sleep apnea, unspecified: Secondary | ICD-10-CM | POA: Insufficient documentation

## 2024-08-18 DIAGNOSIS — D508 Other iron deficiency anemias: Secondary | ICD-10-CM

## 2024-08-18 DIAGNOSIS — G8929 Other chronic pain: Secondary | ICD-10-CM | POA: Insufficient documentation

## 2024-08-18 DIAGNOSIS — Z806 Family history of leukemia: Secondary | ICD-10-CM | POA: Insufficient documentation

## 2024-08-18 DIAGNOSIS — Z825 Family history of asthma and other chronic lower respiratory diseases: Secondary | ICD-10-CM | POA: Insufficient documentation

## 2024-08-18 DIAGNOSIS — Z887 Allergy status to serum and vaccine status: Secondary | ICD-10-CM | POA: Insufficient documentation

## 2024-08-18 DIAGNOSIS — Z83438 Family history of other disorder of lipoprotein metabolism and other lipidemia: Secondary | ICD-10-CM | POA: Diagnosis not present

## 2024-08-18 DIAGNOSIS — R5383 Other fatigue: Secondary | ICD-10-CM | POA: Insufficient documentation

## 2024-08-18 DIAGNOSIS — N920 Excessive and frequent menstruation with regular cycle: Secondary | ICD-10-CM | POA: Insufficient documentation

## 2024-08-18 DIAGNOSIS — D509 Iron deficiency anemia, unspecified: Secondary | ICD-10-CM | POA: Insufficient documentation

## 2024-08-18 MED ORDER — IRON SUCROSE 20 MG/ML IV SOLN
200.0000 mg | Freq: Once | INTRAVENOUS | Status: AC
  Administered 2024-08-18: 200 mg via INTRAVENOUS
  Filled 2024-08-18: qty 10

## 2024-08-18 NOTE — Patient Instructions (Signed)

## 2024-08-18 NOTE — Progress Notes (Signed)
 Hematology/Oncology Progress note [phone] [phone]         Patient Care Team: Leavy Mole, PA-C as PCP - General (Family Medicine) Babara Call, MD as Consulting Physician (Oncology)   REFERRING PROVIDER: Leavy Mole, PA-C  CHIEF COMPLAINTS/REASON FOR VISIT:  Iron  deficiency anemia.  ASSESSMENT & PLAN:   IDA (iron  deficiency anemia) Due to bariatric surgery and possible chronic blood loss from menorrhagia.  Labs are reviewed and discussed with patient.  Lab Results  Component Value Date   HGB 11.5 (L) 08/16/2024   TIBC 402 08/16/2024   IRONPCTSAT 13 08/16/2024   FERRITIN 15 08/16/2024   Iron  saturation is at low normal end.  Recommend IV Venofer  x 3 for history of gastric bypass.   Menorrhagia Hysterectomy was recommended however cannot be done until pannus resection first.   No orders of the defined types were placed in this encounter.  Follow up in 4 months.  All questions were answered. The patient knows to call the clinic with any problems, questions or concerns.  Call Babara, MD, PhD [facility] Hematology Oncology 08/18/2024     HISTORY OF PRESENTING ILLNESS:  Tanya Reeves is a  38 y.o.  female with PMH listed below who was referred to me for anemia Reviewed patient's recent labs that was done.   abnormal CBC on 2/13, Hb 10.8, mcv 75, elevated B12, normal folate, iron  saturation 8, ferriitn 4, TIBC 467 Patient has bariatric surgery in 2022.  + fatigue, shortness of breath on  exertion She denies recent chest pain on exertion,  pre-syncopal episodes, or palpitations She had not noticed any recent bleeding such as epistaxis, hematuria or hematochezia.  + Heavy menstrual period She denies over the counter NSAID ingestion.  She takes bariatric multivitamin and oral iron  supplementation.    INTERVAL HISTORY Tanya Reeves is a 38 y.o. female who has above history reviewed by me today presents for follow up visit for iron   deficiency anemia.  She tolerated IV Venofer  treatments.   Patient reports heavy menstrual period. Fatigue level is much worse.   MEDICAL HISTORY:  Past Medical History:  Diagnosis Date   Asthma    Chronic hip pain    Chronic sinusitis 10/02/2016   Essential hypertension, benign 02/26/2016   Gastroesophageal reflux disease without esophagitis 08/26/2018   Hypertension    Incisional hernia with obstruction but no gangrene    Migraines    Morbid obesity (HCC) 03/17/2016   Pre-existing hypertension during pregnancy in third trimester 09/23/2014   Shortness of breath on exertion 08/26/2016   Sleep apnea     SURGICAL HISTORY: Past Surgical History:  Procedure Laterality Date   CESAREAN SECTION     2   CHOLECYSTECTOMY     COLON SURGERY     part of bowel removed due to c-section   INSERTION OF MESH  10/17/2021   Procedure: INSERTION OF MESH;  Surgeon: Desiderio Schanz, MD;  Location: [facility] ORS;  Service: General;;   sips     STOMACH SURGERY     XI ROBOTIC ASSISTED VENTRAL HERNIA N/A 10/17/2021   Procedure: XI ROBOTIC ASSISTED VENTRAL HERNIA, incisional;  Surgeon: Desiderio Schanz, MD;  Location: [facility] ORS;  Service: General;  Laterality: N/A;  Incisional hernia    SOCIAL HISTORY: Social History   Socioeconomic History   Marital status: Married    Spouse name: Evalene    Number of children: 2   Years of education: Not on file   Highest education level: Associate degree:  academic program  Occupational History   Not on file  Tobacco Use   Smoking status: Never   Smokeless tobacco: Never  Vaping Use   Vaping status: Never Used  Substance and Sexual Activity   Alcohol use: Not Currently    Comment: Social   Drug use: No   Sexual activity: Yes    Partners: Male    Birth control/protection: Surgical    Comment: Tubal Ligation   Other Topics Concern   Not on file  Social History Narrative   Not on file   Social Drivers of Health   Financial Resource Strain: Low Risk   (01/07/2024)   Received from [facility]   Overall Financial Resource Strain (CARDIA)    Difficulty of Paying Living Expenses: Not very hard  Food Insecurity: No Food Insecurity (01/07/2024)   Received from [facility]   Hunger Vital Sign    Within the past 12 months, you worried that your food would run out before you got the money to buy more.: Never true    Within the past 12 months, the food you bought just didn't last and you didn't have money to get more.: Never true  Transportation Needs: No Transportation Needs (01/07/2024)   Received from [facility]   PRAPARE - Transportation    Lack of Transportation (Medical): No    Lack of Transportation (Non-Medical): No  Physical Activity: Insufficiently Active (01/20/2023)   Exercise Vital Sign    Days of Exercise per Week: 1 day    Minutes of Exercise per Session: 30 min  Stress: No Stress Concern Present (01/20/2023)   [employer] of Occupational Health - Occupational Stress Questionnaire    Feeling of Stress : Not at all  Social Connections: Socially Integrated (01/20/2023)   Social Connection and Isolation Panel    Frequency of Communication with Friends and Family: More than three times a week    Frequency of Social Gatherings with Friends and Family: More than three times a week    Attends Religious Services: 1 to 4 times per year    Active Member of [employer] or Organizations: Yes    Attends Banker Meetings: 1 to 4 times per year    Marital Status: Married  Catering manager Violence: Not At Risk (02/01/2023)   Humiliation, Afraid, Rape, and Kick questionnaire    Fear of Current or Ex-Partner: No    Emotionally Abused: No    Physically Abused: No    Sexually Abused: No    FAMILY HISTORY: Family History  Problem Relation Age of Onset   Diabetes Mother    Cancer Mother        femal organs   Hypoparathyroidism Mother    Asthma Mother    Diabetes Father    Juvenile idiopathic arthritis Daughter     Diabetes Maternal Grandmother    Diabetes Maternal Grandfather    Leukemia Paternal Grandmother    Aneurysm Paternal Grandfather     ALLERGIES:  is allergic to cinnamon and influenza vaccines.  MEDICATIONS:  Current Outpatient Medications  Medication Sig Dispense Refill   eletriptan  (RELPAX ) 20 MG tablet TAKE 1 TABLET BY MOUTH AS NEEDED FOR MIGRAINE OR HEADACHE. MAY REPEAT IN 2 HOURS IF HEADACHE PERSISTS OR RECURS 6 tablet 0   Multiple Vitamins-Minerals (BARIATRIC MULTIVITAMINS/IRON ) CAPS Take 1 capsule by mouth daily.     ondansetron  (ZOFRAN  ODT) 4 MG disintegrating tablet Take 1 tablet (4 mg total) by mouth every 8 (eight) hours as needed  for nausea or vomiting. 30 tablet 0   No current facility-administered medications for this visit.    Review of Systems  Constitutional:  Positive for fatigue. Negative for appetite change, chills and fever.  HENT:   Negative for hearing loss and voice change.   Eyes:  Negative for eye problems.  Respiratory:  Negative for chest tightness and cough.   Cardiovascular:  Negative for chest pain.  Gastrointestinal:  Negative for abdominal distention, abdominal pain and blood in stool.  Endocrine: Negative for hot flashes.  Genitourinary:  Positive for menstrual problem. Negative for difficulty urinating and frequency.   Musculoskeletal:  Negative for arthralgias.  Skin:  Negative for itching and rash.  Neurological:  Negative for extremity weakness.  Hematological:  Negative for adenopathy.  Psychiatric/Behavioral:  Negative for confusion.     PHYSICAL EXAMINATION: ECOG PERFORMANCE STATUS: 1 - Symptomatic but completely ambulatory Vitals:   08/18/24 1307  BP: 128/72  Pulse: 80  Resp: 16  Temp: 98.9 F (37.2 C)  SpO2: 98%   Filed Weights   08/18/24 1307  Weight: 275 lb (124.7 kg)    Physical Exam Constitutional:      General: She is not in acute distress. HENT:     Head: Normocephalic and atraumatic.  Eyes:     General: No  scleral icterus. Cardiovascular:     Rate and Rhythm: Normal rate and regular rhythm.  Pulmonary:     Effort: Pulmonary effort is normal. No respiratory distress.  Abdominal:     Palpations: Abdomen is soft.  Musculoskeletal:        General: No deformity. Normal range of motion.     Cervical back: Normal range of motion and neck supple.  Skin:    General: Skin is warm and dry.  Neurological:     Mental Status: She is alert and oriented to person, place, and time. Mental status is at baseline.     Cranial Nerves: No cranial nerve deficit.  Psychiatric:        Mood and Affect: Mood normal.     LABORATORY DATA:  I have reviewed the data as listed    Latest Ref Rng & Units 08/16/2024    1:03 PM 02/09/2024   10:30 AM 08/11/2023    1:24 PM  CBC  WBC 4.0 - 10.5 K/uL 6.9  7.1  6.6   Hemoglobin 12.0 - 15.0 g/dL 88.4  87.8  88.0   Hematocrit 36.0 - 46.0 % 36.5  37.7  37.2   Platelets 150 - 400 K/uL 261  241  281       Latest Ref Rng & Units 02/02/2023    6:01 AM 02/01/2023    5:00 AM 01/31/2023   11:05 PM  CMP  Glucose 70 - 99 mg/dL 887   879   BUN 6 - 20 mg/dL <5   13   Creatinine 9.55 - 1.00 mg/dL 9.56  9.56  9.52   Sodium 135 - 145 mmol/L 136   139   Potassium 3.5 - 5.1 mmol/L 3.2   3.4   Chloride 98 - 111 mmol/L 107   106   CO2 22 - 32 mmol/L 24   24   Calcium 8.9 - 10.3 mg/dL 8.2   8.9   Total Protein 6.5 - 8.1 g/dL   7.3   Total Bilirubin 0.3 - 1.2 mg/dL   0.5   Alkaline Phos 38 - 126 U/L   54   AST 15 - 41 U/L   16  ALT 0 - 44 U/L   21       Component Value Date/Time   IRON  54 08/16/2024 1303   TIBC 402 08/16/2024 1303   FERRITIN 15 08/16/2024 1303   IRONPCTSAT 13 08/16/2024 1303   IRONPCTSAT 8 (L) 12/31/2022 0911     RADIOGRAPHIC STUDIES: I have personally reviewed the radiological images as listed and agreed with the findings in the report. No results found.

## 2024-08-18 NOTE — Assessment & Plan Note (Signed)
 Hysterectomy was recommended however cannot be done until pannus resection first.

## 2024-08-18 NOTE — Assessment & Plan Note (Addendum)
 Due to bariatric surgery and possible chronic blood loss from menorrhagia.  Labs are reviewed and discussed with patient.  Lab Results  Component Value Date   HGB 11.5 (L) 08/16/2024   TIBC 402 08/16/2024   IRONPCTSAT 13 08/16/2024   FERRITIN 15 08/16/2024   Iron  saturation is at low normal end.  Recommend IV Venofer  x 3 for history of gastric bypass.

## 2024-08-25 ENCOUNTER — Inpatient Hospital Stay

## 2024-08-25 VITALS — BP 124/63 | HR 72 | Temp 97.8°F | Resp 18

## 2024-08-25 DIAGNOSIS — D509 Iron deficiency anemia, unspecified: Secondary | ICD-10-CM | POA: Diagnosis not present

## 2024-08-25 DIAGNOSIS — D508 Other iron deficiency anemias: Secondary | ICD-10-CM

## 2024-08-25 MED ORDER — SODIUM CHLORIDE 0.9% FLUSH
10.0000 mL | Freq: Once | INTRAVENOUS | Status: AC | PRN
  Administered 2024-08-25: 10 mL
  Filled 2024-08-25: qty 10

## 2024-08-25 MED ORDER — IRON SUCROSE 20 MG/ML IV SOLN
200.0000 mg | Freq: Once | INTRAVENOUS | Status: AC
  Administered 2024-08-25: 200 mg via INTRAVENOUS
  Filled 2024-08-25: qty 10

## 2024-08-31 ENCOUNTER — Encounter: Payer: Self-pay | Admitting: Oncology

## 2024-09-01 ENCOUNTER — Inpatient Hospital Stay

## 2024-09-01 NOTE — Telephone Encounter (Signed)
 Ash will you contact pt to r/s iron  infusion please

## 2024-09-15 ENCOUNTER — Inpatient Hospital Stay

## 2024-09-15 VITALS — BP 133/80 | HR 73 | Temp 97.9°F | Resp 18

## 2024-09-15 DIAGNOSIS — D508 Other iron deficiency anemias: Secondary | ICD-10-CM

## 2024-09-15 DIAGNOSIS — D509 Iron deficiency anemia, unspecified: Secondary | ICD-10-CM | POA: Diagnosis not present

## 2024-09-15 MED ORDER — IRON SUCROSE 20 MG/ML IV SOLN
200.0000 mg | Freq: Once | INTRAVENOUS | Status: AC
  Administered 2024-09-15: 200 mg via INTRAVENOUS
  Filled 2024-09-15: qty 10

## 2024-09-15 NOTE — Patient Instructions (Signed)

## 2024-10-07 ENCOUNTER — Telehealth: Payer: Self-pay | Admitting: Family Medicine

## 2024-10-07 NOTE — Telephone Encounter (Signed)
 eletriptan (RELPAX) 20 MG tablet

## 2024-10-08 NOTE — Telephone Encounter (Signed)
 Refused, needs appt been over yr. Since seen

## 2024-10-08 NOTE — Telephone Encounter (Signed)
 Appt sch'd for Dec with Julie

## 2024-10-12 ENCOUNTER — Telehealth: Payer: Self-pay | Admitting: Family Medicine

## 2024-10-12 ENCOUNTER — Other Ambulatory Visit: Payer: Self-pay

## 2024-10-12 DIAGNOSIS — G43009 Migraine without aura, not intractable, without status migrainosus: Secondary | ICD-10-CM

## 2024-10-12 MED ORDER — ELETRIPTAN HYDROBROMIDE 20 MG PO TABS: ORAL_TABLET | ORAL | 0 refills | Status: DC

## 2024-10-12 NOTE — Telephone Encounter (Signed)
 Refill sent.

## 2024-10-12 NOTE — Telephone Encounter (Signed)
 eletriptan (RELPAX) 20 MG tablet

## 2024-10-25 ENCOUNTER — Ambulatory Visit (INDEPENDENT_AMBULATORY_CARE_PROVIDER_SITE_OTHER): Admitting: Nurse Practitioner

## 2024-10-25 VITALS — BP 131/86 | HR 74 | Temp 98.0°F | Ht 63.0 in | Wt 272.0 lb

## 2024-10-25 DIAGNOSIS — G4733 Obstructive sleep apnea (adult) (pediatric): Secondary | ICD-10-CM | POA: Diagnosis not present

## 2024-10-25 DIAGNOSIS — G43009 Migraine without aura, not intractable, without status migrainosus: Secondary | ICD-10-CM | POA: Diagnosis not present

## 2024-10-25 DIAGNOSIS — Z6841 Body Mass Index (BMI) 40.0 and over, adult: Secondary | ICD-10-CM

## 2024-10-25 DIAGNOSIS — E66813 Obesity, class 3: Secondary | ICD-10-CM | POA: Diagnosis not present

## 2024-10-25 DIAGNOSIS — Z1231 Encounter for screening mammogram for malignant neoplasm of breast: Secondary | ICD-10-CM

## 2024-10-25 DIAGNOSIS — J4520 Mild intermittent asthma, uncomplicated: Secondary | ICD-10-CM

## 2024-10-25 MED ORDER — ELETRIPTAN HYDROBROMIDE 20 MG PO TABS: ORAL_TABLET | ORAL | 5 refills | Status: AC

## 2024-10-25 NOTE — Progress Notes (Signed)
 BP 131/86   Pulse 74   Temp 98 F (36.7 C)   Ht 5' 3 (1.6 m)   Wt 272 lb (123.4 kg)   SpO2 99%   BMI 48.18 kg/m    Subjective:    Patient ID: Tanya Reeves, female    DOB: 1986/10/27, 38 y.o.   MRN: [id number]  HPI: Tanya Reeves is a 38 y.o. female  Chief Complaint  Patient presents with   Medication Refill   Sleep Apnea    Pt needs to discuss getting a new cpap machine.    Hernia   Discussed the use of AI scribe software for clinical note transcription with the patient, who gave verbal consent to proceed.  History of Present Illness Tanya Reeves is a 38 year old female with migraines, obesity, and sleep apnea who presents for a refill of eletriptan  and evaluation of her CPAP therapy.  Migraine headaches - Migraines since age 43, currently experiencing 6-8 episodes annually. - Triggers include sleep deprivation, weather changes, and menstrual periods. - Migraines frequently occur upon waking, especially around menses. - Relpax  is effective if taken early; supply is variable due to insurance, sometimes limited to 2-4 tablets at a time. - Eletriptan  is ineffective. - No recent changes in migraine frequency or severity.  Obesity and weight loss - History of morbid obesity, initial weight over 500 pounds. - Underwent weight loss surgery in 2020. - Achieved goal weight and continues to lose approximately 3 pounds per week. - Takes bariatric multivitamin. - Blood pressure is monitored regularly and typically stable at 135/76.  Sleep apnea and cpap therapy - Diagnosed with sleep apnea, using CPAP since pregnancy. - Last sleep study was 14 years ago. - Current CPAP machine is old and malfunctioning.  Gastrointestinal symptoms and abdominal history - Premature birth at 26 weeks due to acute renal failure. - History of bowel adhesions, multiple bowel obstructions, and hernia repairs with mesh. - Continued issues with bowel obstructions and hernias related to  excess skin. - Difficulty with bowel movements over the past 8 weeks, requiring her to lay on her right side to facilitate passage.  Cardiac symptoms - Occasional heart palpitations. - Wore a cardiac monitor previously with no significant findings.  Iron  deficiency - Low iron  stores requiring infusions every 6-8 months. - Neurological symptoms related to low iron  occurred one year ago and resolved after treatment.  Recent infectious diseases - Recent COVID-19 infection. - Contracted hand, foot, and mouth disease with severe blistering. - Currently recovering, with ongoing peeling of skin on hands.         09/15/2024    3:05 PM 08/25/2024    2:11 PM 08/18/2024    1:06 PM  Depression screen PHQ 2/9  Decreased Interest 0 0 0  Down, Depressed, Hopeless 0 0 0  PHQ - 2 Score 0 0 0    Relevant past medical, surgical, family and social history reviewed and updated as indicated. Interim medical history since our last visit reviewed. Allergies and medications reviewed and updated.  Review of Systems  Constitutional: Negative for fever or weight change.  Respiratory: Negative for cough and shortness of breath.   Cardiovascular: Negative for chest pain or palpitations.  Gastrointestinal: Negative for abdominal pain, no bowel changes.  Musculoskeletal: Negative for gait problem or joint swelling.  Skin: Negative for rash.  Neurological: Negative for dizziness or headache.  No other specific complaints in a complete review of systems (except as listed in HPI above).  Objective:      BP 131/86   Pulse 74   Temp 98 F (36.7 C)   Ht 5' 3 (1.6 m)   Wt 272 lb (123.4 kg)   SpO2 99%   BMI 48.18 kg/m    Wt Readings from Last 3 Encounters:  10/25/24 272 lb (123.4 kg)  08/18/24 275 lb (124.7 kg)  02/11/24 286 lb (129.7 kg)    Physical Exam VITALS: BP- 144/94 MEASUREMENTS: Weight- 272, BMI- 48.18. GENERAL: Alert, cooperative, well developed, no acute distress HEENT:  Normocephalic, normal oropharynx, moist mucous membranes CHEST: Clear to auscultation bilaterally, no wheezes, rhonchi, or crackles CARDIOVASCULAR: Normal heart rate and rhythm, S1 and S2 normal without murmurs ABDOMEN: Soft, non-tender, non-distended, without organomegaly, normal bowel sounds EXTREMITIES: No cyanosis or edema NEUROLOGICAL: Cranial nerves grossly intact, moves all extremities without gross motor or sensory deficit  Results for orders placed or performed in visit on 08/16/24  Retic Panel   Collection Time: 08/16/24  1:03 PM  Result Value Ref Range   Retic Ct Pct 1.5 0.4 - 3.1 %   RBC. 4.35 3.87 - 5.11 MIL/uL   Retic Count, Absolute 66.1 19.0 - 186.0 K/uL   Immature Retic Fract 14.5 2.3 - 15.9 %   Reticulocyte Hemoglobin 30.3 >27.9 pg  Ferritin   Collection Time: 08/16/24  1:03 PM  Result Value Ref Range   Ferritin 15 11 - 307 ng/mL  Iron  and TIBC   Collection Time: 08/16/24  1:03 PM  Result Value Ref Range   Iron  54 28 - 170 ug/dL   TIBC 597 749 - 549 ug/dL   Saturation Ratios 13 10.4 - 31.8 %   UIBC 348 ug/dL  CBC with Differential (Cancer Center Only)   Collection Time: 08/16/24  1:03 PM  Result Value Ref Range   WBC Count 6.9 4.0 - 10.5 K/uL   RBC 4.35 3.87 - 5.11 MIL/uL   Hemoglobin 11.5 (L) 12.0 - 15.0 g/dL   HCT 63.4 63.9 - 53.9 %   MCV 83.9 80.0 - 100.0 fL   MCH 26.4 26.0 - 34.0 pg   MCHC 31.5 30.0 - 36.0 g/dL   RDW 86.3 88.4 - 84.4 %   Platelet Count 261 150 - 400 K/uL   nRBC 0.0 0.0 - 0.2 %   Neutrophils Relative % 53 %   Neutro Abs 3.7 1.7 - 7.7 K/uL   Lymphocytes Relative 37 %   Lymphs Abs 2.5 0.7 - 4.0 K/uL   Monocytes Relative 6 %   Monocytes Absolute 0.4 0.1 - 1.0 K/uL   Eosinophils Relative 3 %   Eosinophils Absolute 0.2 0.0 - 0.5 K/uL   Basophils Relative 0 %   Basophils Absolute 0.0 0.0 - 0.1 K/uL   Immature Granulocytes 1 %   Abs Immature Granulocytes 0.05 0.00 - 0.07 K/uL          Assessment & Plan:   Problem List Items  Addressed This Visit       Cardiovascular and Mediastinum   Migraines   Relevant Medications   eletriptan  (RELPAX ) 20 MG tablet     Respiratory   Asthma (Chronic)   OSA on CPAP   Relevant Orders   Ambulatory referral to Pulmonology     Other   Class 3 severe obesity with body mass index (BMI) of 50.0 to 59.9 in adult [facility]) - Primary   Other Visit Diagnoses       Encounter for screening mammogram for malignant neoplasm of breast  Relevant Orders   MM 3D SCREENING MAMMOGRAM BILATERAL BREAST        Assessment and Plan Assessment & Plan Migraine without aura, well controlled Migraines are well controlled with Relpax , with approximately six to eight episodes per year. Eletriptan  has been ineffective. Migraines are often triggered by lack of sleep or weather changes, with a recent change in pattern related to hormonal changes around menstruation. - Continue Relpax  for migraine management.  Obstructive sleep apnea on CPAP, evaluation for repeat sleep study Obstructive sleep apnea managed with CPAP. Last sleep study was 14 years ago. CPAP is effective, but the device is old and deteriorating. A new sleep study is needed to reassess the need for CPAP. - Ordered new sleep study.  Class 3 obesity, post-bariatric surgery Class 3 obesity with significant weight loss post-bariatric surgery in 2020. Current weight is 272 pounds with a BMI of 48.18. She is averaging a weight loss of three pounds per week. Continues to take a bariatric multivitamin. - Continue bariatric multivitamin.  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 Previous mesh repair attempts have been unsuccessful due to skin issues. Insurance requires a BMI of 35 for surgery, but she is close to this goal. A new surgery plan involves a partnership between a development worker, international aid and a engineer, petroleum to address both the hernia and excess skin. Surgery is  contingent on re-obstruction or reaching the BMI requirement. - Continue to monitor for signs of bowel obstruction. - Proceed with planned surgery if re-obstruction occurs or BMI requirement is met.  Iron  deficiency anemia with history of iron  infusions Iron  deficiency anemia requiring periodic iron  infusions. Iron  stores are low every six to eight months, with neurological symptoms when critically low. Symptoms resolve with iron  correction. - Continue monitoring iron  levels and administer infusions as needed.  Elevated blood pressure, monitoring Blood pressure was elevated at 144/94 during the visit, but home readings are typically around 135/76. Headache noted, which may contribute to elevated readings. - Rechecked blood pressure during the visit. - Continue home blood pressure monitoring.  Perimenopausal symptoms Experiencing perimenopausal symptoms including hot flashes, irregular periods, and sleep disturbances. Previous estrogen therapy exacerbated migraines. Considering non-hormonal options. - Consider black cohosh Kathreen) for perimenopausal symptoms.  Asthma, well controlled Asthma is well controlled with no recent exacerbations reported. - Continue current asthma management plan.  Due for mammogram, order placed      Follow up plan: Return in about 6 months (around 04/25/2025) for follow up.

## 2024-11-05 ENCOUNTER — Ambulatory Visit: Admitting: Sleep Medicine

## 2024-11-05 ENCOUNTER — Encounter: Payer: Self-pay | Admitting: Sleep Medicine

## 2024-11-05 VITALS — BP 130/80 | HR 75 | Temp 98.1°F | Ht 63.0 in | Wt 266.6 lb

## 2024-11-05 DIAGNOSIS — Z6841 Body Mass Index (BMI) 40.0 and over, adult: Secondary | ICD-10-CM | POA: Diagnosis not present

## 2024-11-05 DIAGNOSIS — G4733 Obstructive sleep apnea (adult) (pediatric): Secondary | ICD-10-CM

## 2024-11-05 NOTE — Progress Notes (Signed)
 "      Name:Tanya Reeves MRN: [id number] DOB: 27-Feb-1986   CHIEF COMPLAINT:  REASSESSMENT OF OSA   HISTORY OF PRESENT ILLNESS: Ms. Tanya Reeves is a 38 y.o. w/ a h/o OSA and morbid obesity s/p bariatric surgery who presents for reassessment of OSA. Reports that she was initially diagnosed with OSA and was subsequently started on CPAP therapy. Reports using CPAP therapy every night. States that her CPAP device is 40 years old and has started malfunctioning. Reports feeling significantly more refreshed upon awakening with CPAP therapy.  Reports nocturnal awakenings due to unclear reasons, however does not have difficulty falling back to sleep. Reports a 140 lb weight loss over the last several years. Admits to morning headaches and night sweats. Denies RLS symptoms, dream enactment, cataplexy, hypnagogic or hypnapompic hallucinations. Denies a family history of sleep apnea. Denies drowsy driving. Drinks 2 cups of coffee daily, occasional alcohol use, denies tobacco or illicit drug use.   Bedtime 9 pm Sleep onset 5 mins Rise time 6 am   EPWORTH SLEEP SCORE 1    11/05/2024    8:00 AM  Results of the Epworth flowsheet  Sitting and reading 0  Watching TV 0  Sitting, inactive in a public place (e.g. a theatre or a meeting) 0  As a passenger in a car for an hour without a break 0  Lying down to rest in the afternoon when circumstances permit 1  Sitting and talking to someone 0  Sitting quietly after a lunch without alcohol 0  In a car, while stopped for a few minutes in traffic 0  Total score 1    PAST MEDICAL HISTORY :   has a past medical history of Asthma, Chronic hip pain, Chronic sinusitis (10/02/2016), Essential hypertension, benign (02/26/2016), Gastroesophageal reflux disease without esophagitis (08/26/2018), Hypertension, Incisional hernia with obstruction but no gangrene, Migraines, Morbid obesity (HCC) (03/17/2016), Pre-existing hypertension during pregnancy in third trimester  (09/23/2014), Shortness of breath on exertion (08/26/2016), and Sleep apnea.  has a past surgical history that includes Cesarean section; Colon surgery; Cholecystectomy; sips; Stomach surgery; XI robotic assisted ventral hernia (N/A, 10/17/2021); and Insertion of mesh (10/17/2021). Prior to Admission medications  Medication Sig Start Date End Date Taking? Authorizing Provider  albuterol  (VENTOLIN  HFA) 108 (90 Base) MCG/ACT inhaler Inhale 1-2 puffs into the lungs every 6 (six) hours as needed for wheezing or shortness of breath (or cough). 10/21/23  Yes [provider]  eletriptan  (RELPAX ) 20 MG tablet TAKE 1 TABLET BY MOUTH AS NEEDED FOR MIGRAINE OR HEADACHE. MAY REPEAT IN 2 HOURS IF HEADACHE PERSISTS OR RECURS 10/25/24  Yes Gareth Mliss FALCON, FNP  MOUNJARO 10 MG/0.5ML Pen Inject 10 mg into the skin once a week. 10/08/24  Yes [provider]  MOUNJARO 12.5 MG/0.5ML Pen Inject 12.5 mg into the skin once a week.   Yes [provider]  Multiple Vitamins-Minerals (BARIATRIC MULTIVITAMINS/IRON ) CAPS Take 1 capsule by mouth daily.   Yes [provider]  ondansetron  (ZOFRAN  ODT) 4 MG disintegrating tablet Take 1 tablet (4 mg total) by mouth every 8 (eight) hours as needed for nausea or vomiting. 12/31/22  Yes Tapia, Leisa, PA-C  tirzepatide [facility]) 15 MG/0.5ML Pen Inject 15 mg into the skin once a week. 10/21/24  Yes [provider]   Allergies[1]  FAMILY HISTORY:  family history includes Aneurysm in her paternal grandfather; Asthma in her mother; Cancer in her mother; Diabetes in her father, maternal grandfather, maternal grandmother, and mother; Hypoparathyroidism in her  mother; Juvenile idiopathic arthritis in her daughter; Leukemia in her paternal grandmother. SOCIAL HISTORY:  reports that she has never smoked. She has never used smokeless tobacco. She reports that she does not currently use alcohol. She reports that she does not use drugs.   Review of  Systems:  Gen:  Denies  fever, sweats, chills weight loss  HEENT: Denies blurred vision, double vision, ear pain, eye pain, hearing loss, nose bleeds, sore throat Cardiac:  No dizziness, chest pain or heaviness, chest tightness,edema, No JVD Resp:   No cough, -sputum production, -shortness of breath,-wheezing, -hemoptysis,  Gi: Denies swallowing difficulty, stomach pain, nausea or vomiting, diarrhea, constipation, bowel incontinence Gu:  Denies bladder incontinence, burning urine Ext:   Denies Joint pain, stiffness or swelling Skin: Denies  skin rash, easy bruising or bleeding or hives Endoc:  Denies polyuria, polydipsia , polyphagia or weight change Psych:   Denies depression, insomnia or hallucinations  Other:  All other systems negative  VITAL SIGNS: BP 130/80   Pulse 75   Temp 98.1 F (36.7 C)   Ht 5' 3 (1.6 m)   Wt 266 lb 9.6 oz (120.9 kg)   LMP  (LMP Unknown)   SpO2 95%   BMI 47.23 kg/m    Physical Examination:   General Appearance: No distress  EYES PERRLA, EOM intact.   NECK Supple, No JVD Pulmonary: normal breath sounds, No wheezing.  CardiovascularNormal S1,S2.  No m/r/g.   Abdomen: Benign, Soft, non-tender. Skin:   warm, no rashes, no ecchymosis  Extremities: normal, no cyanosis, clubbing. Neuro:without focal findings,  speech normal  PSYCHIATRIC: Mood, affect within normal limits.   ASSESSMENT AND PLAN  OSA Due to significant weight loss, will reassess apnea with HST. Discussed the consequences of untreated sleep apnea. Advised not to drive drowsy for safety of patient and others. Will complete further evaluation with a home sleep study and follow up to review results.    Morbid obesity Stable, following with her PCP.    MEDICATION ADJUSTMENTS/LABS AND TESTS ORDERED: Recommend Sleep Study   Patient  satisfied with Plan of action and management. All questions answered  Follow up to review HST results and treatment plan.   I spent a total of 61  minutes reviewing chart data, face-to-face evaluation with the patient, counseling and coordination of care as detailed above.    Aaleigha Bozza, M.D.  Sleep Medicine [facility]           [1]  Allergies Allergen Reactions   Cinnamon Swelling    Tongue swells up. With artificial (such as in gum/red hots)   Dextromethorphan-Guaifenesin  Other (See Comments)    Other Reaction(s): other   Influenza Vaccines Hives    Hives two years in a row.  Hives two years in a row.    "

## 2024-11-05 NOTE — Patient Instructions (Signed)
 SABRA

## 2024-11-15 ENCOUNTER — Encounter

## 2024-11-15 DIAGNOSIS — G4733 Obstructive sleep apnea (adult) (pediatric): Secondary | ICD-10-CM

## 2024-11-26 DIAGNOSIS — R069 Unspecified abnormalities of breathing: Secondary | ICD-10-CM | POA: Diagnosis not present

## 2024-11-29 ENCOUNTER — Ambulatory Visit: Payer: Self-pay

## 2024-11-29 DIAGNOSIS — G4733 Obstructive sleep apnea (adult) (pediatric): Secondary | ICD-10-CM

## 2024-12-07 ENCOUNTER — Encounter: Payer: Self-pay | Admitting: Nurse Practitioner

## 2024-12-08 ENCOUNTER — Other Ambulatory Visit: Payer: Self-pay | Admitting: Nurse Practitioner

## 2024-12-08 DIAGNOSIS — T753XXA Motion sickness, initial encounter: Secondary | ICD-10-CM

## 2024-12-08 MED ORDER — SCOPOLAMINE 1 MG/3DAYS TD PT72: 1.0000 | MEDICATED_PATCH | TRANSDERMAL | 0 refills | Status: AC

## 2024-12-20 ENCOUNTER — Other Ambulatory Visit

## 2024-12-20 ENCOUNTER — Inpatient Hospital Stay

## 2024-12-22 ENCOUNTER — Ambulatory Visit

## 2024-12-22 ENCOUNTER — Ambulatory Visit: Admitting: Oncology

## 2025-01-24 ENCOUNTER — Inpatient Hospital Stay

## 2025-01-25 ENCOUNTER — Inpatient Hospital Stay

## 2025-01-25 ENCOUNTER — Ambulatory Visit: Admitting: Sleep Medicine

## 2025-01-25 ENCOUNTER — Inpatient Hospital Stay: Admitting: Oncology
